# Patient Record
Sex: Male | Born: 1955
Health system: Southern US, Community
[De-identification: ages and names within clinical notes are randomized; demographics above are authoritative.]

## PROBLEM LIST (undated history)

## (undated) DIAGNOSIS — Z9289 Personal history of other medical treatment: Secondary | ICD-10-CM

## (undated) DIAGNOSIS — C439 Malignant melanoma of skin, unspecified: Secondary | ICD-10-CM

## (undated) DIAGNOSIS — E785 Hyperlipidemia, unspecified: Secondary | ICD-10-CM

## (undated) DIAGNOSIS — M1990 Unspecified osteoarthritis, unspecified site: Secondary | ICD-10-CM

## (undated) DIAGNOSIS — I219 Acute myocardial infarction, unspecified: Secondary | ICD-10-CM

## (undated) DIAGNOSIS — I2510 Atherosclerotic heart disease of native coronary artery without angina pectoris: Secondary | ICD-10-CM

## (undated) HISTORY — DX: Hyperlipidemia, unspecified: E78.5

## (undated) HISTORY — DX: Atherosclerotic heart disease of native coronary artery without angina pectoris: I25.10

## (undated) HISTORY — DX: Personal history of other medical treatment: Z92.89

## (undated) HISTORY — PX: COLONOSCOPY: SHX174

## (undated) HISTORY — DX: Acute myocardial infarction, unspecified: I21.9

## (undated) HISTORY — DX: Malignant melanoma of skin, unspecified: C43.9

## (undated) HISTORY — PX: CORONARY ANGIOPLASTY WITH STENT PLACEMENT: SHX49

---

## 2000-02-17 ENCOUNTER — Ambulatory Visit (HOSPITAL_BASED_OUTPATIENT_CLINIC_OR_DEPARTMENT_OTHER): Admission: RE | Admit: 2000-02-17 | Discharge: 2000-02-17 | Payer: Self-pay | Admitting: Otolaryngology

## 2002-07-02 ENCOUNTER — Encounter: Payer: Self-pay | Admitting: Emergency Medicine

## 2002-07-02 ENCOUNTER — Inpatient Hospital Stay (HOSPITAL_COMMUNITY): Admission: EM | Admit: 2002-07-02 | Discharge: 2002-07-07 | Payer: Self-pay | Admitting: Emergency Medicine

## 2005-06-23 ENCOUNTER — Ambulatory Visit: Payer: Self-pay | Admitting: Gastroenterology

## 2005-07-22 ENCOUNTER — Encounter: Payer: Self-pay | Admitting: Gastroenterology

## 2005-07-22 ENCOUNTER — Ambulatory Visit: Payer: Self-pay | Admitting: Gastroenterology

## 2005-07-22 LAB — HM COLONOSCOPY: HM Colonoscopy: NORMAL

## 2007-06-07 ENCOUNTER — Inpatient Hospital Stay (HOSPITAL_COMMUNITY): Admission: EM | Admit: 2007-06-07 | Discharge: 2007-06-09 | Payer: Self-pay | Admitting: Emergency Medicine

## 2007-06-07 HISTORY — PX: CARDIAC CATHETERIZATION: SHX172

## 2007-11-23 ENCOUNTER — Ambulatory Visit: Payer: Self-pay | Admitting: Family Medicine

## 2008-02-11 ENCOUNTER — Ambulatory Visit: Payer: Self-pay | Admitting: Family Medicine

## 2008-05-25 ENCOUNTER — Ambulatory Visit: Payer: Self-pay | Admitting: Family Medicine

## 2008-07-11 ENCOUNTER — Ambulatory Visit: Payer: Self-pay | Admitting: Family Medicine

## 2008-08-10 ENCOUNTER — Telehealth: Payer: Self-pay | Admitting: Gastroenterology

## 2008-08-11 ENCOUNTER — Ambulatory Visit: Payer: Self-pay | Admitting: Family Medicine

## 2008-10-24 ENCOUNTER — Ambulatory Visit: Payer: Self-pay | Admitting: Family Medicine

## 2009-03-14 ENCOUNTER — Ambulatory Visit: Payer: Self-pay | Admitting: Family Medicine

## 2009-03-16 ENCOUNTER — Ambulatory Visit: Payer: Self-pay | Admitting: Family Medicine

## 2010-05-09 ENCOUNTER — Ambulatory Visit (INDEPENDENT_AMBULATORY_CARE_PROVIDER_SITE_OTHER): Payer: 59 | Admitting: Family Medicine

## 2010-05-09 DIAGNOSIS — M722 Plantar fascial fibromatosis: Secondary | ICD-10-CM

## 2010-05-09 DIAGNOSIS — M719 Bursopathy, unspecified: Secondary | ICD-10-CM

## 2010-05-09 DIAGNOSIS — M67919 Unspecified disorder of synovium and tendon, unspecified shoulder: Secondary | ICD-10-CM

## 2010-05-21 NOTE — H&P (Signed)
NAMEAUSTAN, Brandon Frederick                ACCOUNT NO.:  [id number]   MEDICAL RECORD NO.:  [id number]          PATIENT TYPE:  INP   LOCATION:  6526                         FACILITY:  [facility]   PHYSICIAN:  Brandon Frederick, M.D.     DATE OF BIRTH:  1955/09/29   DATE OF ADMISSION:  06/07/2007  DATE OF DISCHARGE:  06/09/2007                              HISTORY & PHYSICAL   CHIEF COMPLAINT:  Pain between his shoulders.   HISTORY OF PRESENT ILLNESS:  The patient is a 55 year old male with a  history of coronary artery disease.  He had an anterior MI treated with  LAD Cypher stenting in June 2004.  He had moderate residual disease in  the OM, circumflex, and PDA.  He has had a recent Myoview as an  outpatient which was low risk with an EF of 42%.  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 He was doing some non-strenuous work in [employer] when he developed some subscapular pain just like my heart  attack.  Symptoms eventually were relieved with nitroglycerin.  He had  some mild diaphoresis and shortness of breath with his pain.  He has had  no recent history of chest pain or dyspnea on exertion.   PAST MEDICAL HISTORY:  Remarkable for dyslipidemia and chronic  hypotension.   CURRENT MEDICATIONS:  1. Altace 2.5 mg b.i.d.  2. Vytorin 10/40 daily.  3. Aspirin 81 mg a day.  4. Folic acid.  5. Plavix.   ALLERGIES:  He has no known drug allergies, but he has been intolerant  to higher doses of ACE inhibitors and intolerant to beta-blockers in the  past because of bradycardia.   SOCIAL HISTORY:  He is married.  He is retired.  He is a nonsmoker.   FAMILY HISTORY:  Remarkable that his father had an MI in his 25s.   REVIEW OF SYSTEMS:  Essentially unremarkable, except for noted above.  He denies any GI bleeding, melena, fever, or chills.  He has not had  diabetes or thyroid problems.   PHYSICAL EXAMINATION:  VITAL SIGNS:  Blood pressure 98/58, pulse 76, and  respirations 16.  GENERAL:  He  is well-developed, well-nourished male in no acute  distress.  HEENT:  Normocephalic.  Extraocular movements are intact.  Sclerae is  anicteric.  Lids and conjunctivae are within normal limits.  NECK:  Without JVD or bruit.  Thyroid is not enlarged.  CHEST:  Clear to auscultation and percussion.  CARDIAC:  Reveals regular rate and rhythm without murmur, rub, or  gallop.  Normal S1 and S2.  ABDOMEN:  Nontender. No hepatosplenomegaly.  Bowel sounds are present.  EXTREMITIES:  Without edema.  Distal pulses are 3+/4 bilaterally.  NEURO:  Grossly intact.  He is awake, alert, oriented, and cooperative.  Moves all extremities without obvious deficit.  SKIN:  Warm and dry.   EKG in the emergency room shows sinus rhythm with septal Q-waves.  His  initial troponin is negative.   IMPRESSION:  1. Unstable angina.  2. Coronary artery disease with left anterior descending Cypher  stenting in June 2004.  3. Moderate left ventricular dysfunction with an ejection fraction of      42% by recent Myoview.  4. Dyslipidemia, currently on treatment.  5. Family history of coronary artery disease.   PLAN:  The patient was seen by Dr. Clarene Duke and myself in the emergency  room.  We will start him on IV heparin and nitroglycerin.  He will most  likely need diagnostic catheterization and we will set this up for  tomorrow.      Abelino Derrick, P.A.    ______________________________  Brandon Frederick, M.D.    Lenard Lance  D:  06/09/2007  T:  06/09/2007  Job:  [id number]

## 2010-05-21 NOTE — Discharge Summary (Signed)
NAMEVANNIE, Brandon Frederick                ACCOUNT NO.:  [id number]   MEDICAL RECORD NO.:  [id number]          PATIENT TYPE:  INP   LOCATION:  6526                         FACILITY:  [facility]   PHYSICIAN:  Nicki Guadalajara, M.D.     DATE OF BIRTH:  1955-09-22   DATE OF ADMISSION:  06/07/2007  DATE OF DISCHARGE:  06/09/2007                               DISCHARGE SUMMARY   DISCHARGE DIAGNOSES:  1. Unstable angina, posterolateral artery and left anterior descending      Cypher stenting this admission.  2. Known coronary disease with previous left anterior descending      Cypher stenting in June 2004, the site was patent this admission.  3. Treated dyslipidemia.  4. Moderate left ventricular dysfunction with an ejection fraction of      45%.  5. History of chronic hypotension.  6. History of beta-blocker intolerance because of bradycardia.   HOSPITAL COURSE:  Brandon Frederick is a 55 year old male followed by Dr. Tresa Endo.  He had an anterior MI treated with LAD Cypher stent in June 2004.  He  has done well since.  He had moderate disease in the circumflex and PDA  and OM in 2004.  He recently had a negative Myoview as an outpatient.  He presented to the emergency room on June 07, 2007, with subscapular  pain which he says was very similar to his pre MI symptoms.  He was  admitted to telemetry, started on IV heparin and nitrates.  Enzymes were  negative.  Diagnostic catheterization was done on June 08, 2007, which  revealed 90% PLA lesion and a 90% LAD lesion after the previously placed  Cypher stent.  He will underwent interventions to both sites with Cypher  stents.  He tolerated the procedure well.  His EF was 45% at cath.  His  enzyme troponins were negative.  We feel he can be discharged on June 09, 2007.  He will follow up Dr. Tresa Endo as an outpatient.   DISCHARGE MEDICATIONS:  1. Altace 2.5 mg b.i.d.  2. Vytorin 10/40 daily.  3. Plavix 75 mg a day.  4. Aspirin 325 mg a day.  5. Folic acid 1 mg a  day.  6. Imdur 30 mg a day.  7. Nitroglycerin sublingual p.r.n.   LABS:  CK-MB and troponins were negative.  Sodium 139, potassium 3.8,  BUN 10, and creatinine 1.27.  White count 7.8, hemoglobin 13.6,  hematocrit 39.7, and platelets 147,000.  TSH 3.45.  Chest x-ray:  No  active disease.  INR 1.0.  EKG:  Sinus rhythm, low voltage, septal Q-  waves.   DISPOSITION:  The patient was discharged in stable condition.  He will  follow up with Dr. Tresa Endo as an outpatient.  As noted above in the past,  the patient has been intolerant to the higher doses of ACE inhibitors  because of hypotension and intolerant to beta-blockers because of  bradycardia.      Brandon Frederick, P.A.    ______________________________  Nicki Guadalajara, M.D.    Lenard Lance  D:  06/09/2007  T:  06/09/2007  Job:  [id number] 

## 2010-05-21 NOTE — Cardiovascular Report (Signed)
Brandon Frederick, Brandon Frederick                ACCOUNT NO.:  [id number]   MEDICAL RECORD NO.:  [id number]           PATIENT TYPE:   LOCATION:                                 FACILITY:   PHYSICIAN:  Nicki Guadalajara, M.D.     DATE OF BIRTH:  1955-12-23   DATE OF PROCEDURE:  06/08/2007  DATE OF DISCHARGE:                            CARDIAC CATHETERIZATION   INDICATIONS:  Mr. Heywood Tokunaga is a 55 year old gentleman who suffered  an anterior wall myocardial infarction on July 02, 2002, and underwent  successful stenting of a totally occluded proximal LAD with ultimate  insertion of a 3.5 x 28 mm Cypher stent.  He did have concomitant CAD  with 50% and 70% narrowings in the circumflex vessel, 10% narrowing in  the proximal right coronary artery, and 40% PLA stenosis.  Subsequently,  he has done remarkably well.  He was admitted to [facility]  yesterday with back discomfort, which he felt was similar to his prior  discomfort.  He is now referred for definitive repeat cardiac  catheterization.   PROCEDURE:  After premedication with Versed 2 mg intravenously, the  patient was prepped and draped in the usual fashion.  His right femoral  artery was punctured anteriorly and a 5-French sheath was inserted.  Diagnostic catheterization was done utilizing 5-French Judkins 4 left  and right coronary catheters.  IC nitroglycerin was administered down  the LAD as well as down the right coronary artery for further evaluation  of the new areas of stenoses.  A 5-French pigtail catheter was used for  biplane cine left ventriculography as well as distal aortography.  With  the demonstration of progressive narrowing of 90% beyond the stented  segment in the mid LAD as well as 90% mid PLA stenosis, the decision was  made to perform intervention.  Angiograms were reviewed with the patient  who concurred.  The 5-French sheath was upgraded to a 6-French system.  The patient had been on aspirin and Plavix and  consequently was given  Angiomax for anticoagulation.  He received an additional 300 mg of  Plavix load in the lab since he had not taken his Plavix today.  Attention was first directed at the LAD.  ACT was documented to be  therapeutic.  The patient received also 25 mg of fentanyl, which  improved his nitrate-induced headache.  Ultimately, XB 4.0 guide 6-  [place] was used for the intervention.  An ATW marker wire was advanced  down the LAD and was help with stent sizing.  I had requested a 3.0 x 18  mm Cypher stent, but unfortunately there were none available in the  laboratory and consequently, I inserted a 2.75 x 18 mm Cypher stent in  the mid LAD with the previously placed more proximal stent at the most  distal end of this previously placed stent and the 18-mm stent covered  the 90% stenosis and narrowing of 30% just beyond this.  There was no  attempt to cover the more distally segment of 90% in the mid distal LAD.  The stent was dilated up  to 16 and 17 atmospheres.  Post-stent  dilatation was done utilizing a 3.25 x 15 mm balloon with stent taper  from 3.35 mm proximally to 3.25 mm distally.  Scout angiography  confirmed an excellent angiographic result with brisk TIMI III flow and  the entire stenosis being reduced to 0% from 90%.   Attention was then directed at the right coronary artery.  The patient  received an additional 25 mg of fentanyl.  An FR-4 right catheter was  used, 6-French.  The same ATW wire was advanced down the RCA into the  PLA vessel.  Primary stenting was done utilizing a 2.75 x 13 mm Cypher  stent.  Post-dilatation was done utilizing a 3.0 x 12 mm Cypher stent,  but there still was mild dumbbell shape in the mid segment at the site  of the focal stenosis and a 3.25 x 8 mm Quantum balloon was used to  maximally dilate this segment.  The 90% stenosis was reduced to 0%.  There was brisk TIMI III flow.  There was no evidence for dissection.  The patient  tolerated the procedure well.   HEMODYNAMIC DATA:  Central aortic pressure was 116/78 and left ventricle  pressure was 116/10.   ANGIOGRAPHIC DATA:  Left main coronary artery was angiographically  normal and bifurcated into LAD and left circumflex system.  There was  minimal luminal irregularity of 10% in the ostially and proximal to the  first diagonal vessel.  The previously placed Cypher stent 3.5 x 28 mm  was in the LAD and placed between the first and second diagonal vessel.  This was widely patent without restenosis.  Beyond the second diagonal  vessel, there was 90% stenosis in the LAD followed by an area of 30%  narrowing immediately thereafter and then there was an area of normal-  appearing vessel and then beyond this segment in the mid distal LAD,  there was another 30% area of narrowing.   The circumflex vessel had 50% OM-1 narrowing and 30-40% proximal AV  groove narrowing, which actually was improved from 2004.   The right coronary was a large-caliber dominant vessel, which had 20%  irregularity proximally and 20-30% irregularity in the region of the  crux prior to the PDA takeoff.  The RCA supplied a large PDA and large  posterolateral vessel.  There was a focal 90% mid stenosis in this large  PLA vessel.   Biplane cine left ventriculography revealed mild global LV dysfunction  with an ejection fraction of approximately 45%.  There was severe  hypokinesis involving the anterolateral wall from the mid segment to the  apex on the RAO projection, and the LAO projection, there was  hypocontractility in the mid to basal septal region.   Following coronary intervention to the LAD, the 90% stenosis beyond the  stent from 2004 was ultimately reduced to 0% with insertion of a 2.75 x  18 mm Cypher stent in tandem with the previously placed more proximal  stent and with post-dilatation up to 3.35 mm tapering to 3.25 mm at the  distal aspect of this stent.  There was no change  in the more distal 30%  narrowing in the LAD, which was not intervened upon.   Following intervention to the right coronary artery, the 90% PLA  stenosis was reduced to 0% with insertion of a 2.75 x 13 mm Cypher  stent.  Initially, postdilated with a 3.0 x 12 mm Quantum balloon and  ultimately postdilated with a 3.25  x 8 mm Quantum balloon with the 95%  stenosis being reduced to 0%, done with Angiomax, Plavix, and IC  nitroglycerin.           ______________________________  Nicki Guadalajara, M.D.     TK/MEDQ  D:  06/08/2007  T:  06/09/2007  Job:  [id number]

## 2010-05-24 NOTE — Cardiovascular Report (Signed)
Brandon Frederick, Brandon Frederick                           ACCOUNT NO.:  [id number]   MEDICAL RECORD NO.:  [id number]                   PATIENT TYPE:  INP   LOCATION:  2924                                 FACILITY:  [facility]   PHYSICIAN:  Nicki Guadalajara, M.D.                  DATE OF BIRTH:  11-29-1955   DATE OF PROCEDURE:  DATE OF DISCHARGE:                              CARDIAC CATHETERIZATION   PROCEDURE:  Emergency left-heart catheterization:  Cine coronary  angiography; biplane cine electrocardiography, distal aortography, temporary  transvenous pacemaker, AngioJet Rheolytic thrombectomy, PTCA and stenting of  the left anterior descending artery, ReoPro glycoprotein IIb/IIIa  inhibition.   INDICATIONS:  The patient is a 55 year old white, previously-healthy male  without prior cardiac history, on no medications.  He developed acute onset  of substernal chest pressure that was severe while cutting his grass at  approximately 4 p.m.  His chest pain persisted and became more intense.  He  was brought to the emergency room via EMS and ECG transmission revealed  acute anterior wall injury current with 5 mm of ST-segment elevation.   The patient arrived in the emergency room and was treated with IV  nitroglycerin, heparin, aspirin, morphine, as well as IV beta-blocker 5 mg  x3, and he was taken emergently to the cardiac catheterization laboratory.   PROCEDURE:  After premedication with an additional 2 mg of Versed, upon  arrival to the catheterization laboratory the patient was prepped and draped  in the usual fashion.  His right femoral artery was punctured anteriorly,  and a 7-French sheath was inserted.  Diagnostic catheterization was done  with 6-French Judkins' left and right coronary catheters.  A pigtail  catheter was used for biplane cine electrocardiography as well as distal  aortography.  With the demonstration of total occlusion with thrombus in the  LAD, it was felt that the patient  would benefit from emergency intervention  as well as AngioJet from Rheolytic thrombectomy.  ReoPro bolus plus infusion  was given for glycoprotein IIb/IIIa inhibition in its acute coronary ST  segment elevation MI setting.  Plavix 300 mg was administered.  The right  femoral vein was punctured, and a venous sheath was inserted.  A 5-French  transvenous pacemaker was advanced under fluoroscopic guidance to the RV  apex.  Thresholds were obtained which revealed excellent capture and  sensitivity.  A pacer wire was then advanced down the LAD across the total  occlusion to the apex.  This resulted in some restoration of flow, but there  was an extensive thrombus burden.  AngioJet was then performed with two  prolonged passes throughout the mid distal to proximal LAD.  This resulted  in significant removal of thrombus burden, and it was now apparent that  there was a significant ulcerated plaque at the acute infarct site.  Dilatation was done with a 3.25 x15 mm Maverick balloon.  It was  felt that  there was a narrowing above and below this ulcerated plaque, and  consequently, a 3.5 x28 mm drug eluting CYPHER stent was positioned just  after the large proximal diagonal vessel and just prior to the mid diagonal  vessel.  This was successfully dilated and deployed up to 18 atmospheres,  corresponding 3.79 mm.  Post stent dilatation was done with a 4.0 x20 mm  Quantum balloon in a tapering fashion to the extent that the proximal  stented segment was dilated to 4.1 mm and the distal stented segment was  dilated to 4.0 mm.  There was TIMI 3 flow.  There was no evidence for  dissection.  During the procedure, the patient received several doses of  intracoronary nitroglycerin.  He also received several additional boluses of  intravenous heparin in addition to the previous 5000 units of heparin that  had been administered in the emergency room to maintain ACT in the high  therapeutic range, especially  in light of his significant thrombus burden  initially.  At the completion of the procedure, he was hemodynamically  stable, and essentially pain-free.  Arterial and venous sheaths were sutured  in place.  Upon leaving the catheterization laboratory, there was no  evidence for any hematoma at either site.   HEMODYNAMIC DATA:  Central aortic pressure was 124/82.  Left ventricular  pressure was 124/12.  Post A-wave was 23.   ANGIOGRAPHIC DATA:  Left main coronary artery was a large vessel that had  less than 10% narrowing in its mid segment.  The left main bifurcated into  an LAD and left circumflex system.  The proximal LAD had 20% narrowing.  The  first diagonal vessel was a small-caliber vessel.  There was some mild,  sluggish flow in this very small first diagonal vessel which also appeared  to have ostial narrowing of 90%.  The second diagonal vessel was a very  large caliber vessel which was still very proximal.  Just after this large  diagonal vessel, the LAD was totally occluded, and there was evidence for  occlusive thrombus.  There was a large void without collateralization in the  remaining anterior and apical wall.  The circumflex vessel had 50% proximal  narrowing.  The first marginal vessel was small caliber and had 70% ostial  narrowing.  The second marginal vessel was small caliber and free of  significant disease.   The right coronary artery was a very large, dominant vessel that had mild  10% narrowing proximally.  The vessel supplied a large PDA, two inferior LV  branches, and ended in a large posterolateral coronary artery with 40% mid  PLA narrowing.   Biplane cine electrocardiography revealed acute LV dysfunction with  significant hypo to akinesis involving the mid anterior anterolateral wall  extending to the apex and wrapping around to involve the distal inferior wall in the RAO projection, and involving the entire intraventricular septal  segment, extending to  the apex on the LAO projection.   DISTAL AORTOGRAPHY:  Revealed a tortuous nonobstructive infrarenal aorta.  There was no renal artery stenosis.   Following successful AngioJet Rheolytic thrombectomy, PTCA, and stenting  with a 3.5 x28 mm drug-eluting CYPHER stent with post-dilatation in a  tapered fashion to 4.1 mm proximally and 4.0 mm in the distal aspect of the  stent, the 100% occlusion was reduced to 0%.  TIMI 0 flow was improved to  TIMI 3 flow.  At the completion of the procedure, there was no evidence  for  any dissection, and the patient was pain free.   IMPRESSION:  1. Acute anterior wall myocardial infarction/ injury secondary to proximal     left anterior descending occlusion with initial TIMI 0 flow.  2. Acute mild to moderate LV dysfunction (EF of 45% with significant hypo to     akinesis involving the mid distal anterolateral wall apex and distal     inferior wall in the RAO projection involving the entire intraventricular     septum extending to the apex on the LAO projection.  3. Evidence for multivessel CAD with mild 10% narrowing in the mid left     main, 20% proximal LAD narrowing with 90-95% stenosis in a very small     first-diagonal vessel ostially, 40-50% narrowing in the ostium of a large     proximal diagonal vessel, followed by total occlusion of the proximal     LAD.  4. A 50% proximal circumflex stenosis with 70% stenosis in a small OM1     vessel.  5. A 10% irregularity in the proximal RCA with 40% narrowing in a large     posterolateral branch, and a dominant right coronary artery.  6. Successful PTCA/ stenting following AngioJet Rheolytic thrombectomy with     ultimate DES stenting with a 3.5 x28 mm CYPHER stent positioned between     the second and third diagonal vessels, dilated to 4.1 with 4.0 taper,     with residual narrowing of 0% and resumption of TIMI 3 flow.     (Reperfusion time 2 hours and 35 minutes from chest pain onset).  7. Temporary  pacemaker.  8. AngioJet.  9. ReoPro glycoprotein IIb/IIIa for inhibition.                                               Nicki Guadalajara, M.D.    TK/MEDQ  D:  07/02/2002  T:  07/03/2002  Job:  [id number]  Sheppard Penton. Stacie Acres, M.D.  [address]  [place] [address]  Fax: [phone]   Card[facility]athe[facility]   [facility]   cc:   Sheppard Penton. Stacie Acres, M.D.  [address] [place]  [place] [address]  Fax: [phone]   Cardiac Catheterization Laboratory   [facility]

## 2010-05-24 NOTE — Discharge Summary (Signed)
Brandon Frederick, Brandon Frederick                           ACCOUNT NO.:  [id number]   MEDICAL RECORD NO.:  [id number]                   PATIENT TYPE:  INP   LOCATION:  2034                                 FACILITY:  [facility]   PHYSICIAN:  Nicki Guadalajara, M.D.                  DATE OF BIRTH:  04-Jun-1955   DATE OF ADMISSION:  07/02/2002  DATE OF DISCHARGE:  07/07/2002                                 DISCHARGE SUMMARY   DISCHARGE DIAGNOSES:  1. Acute anterior myocardial infarction.  2. Coronary artery disease with rescue percutaneous transluminal coronary     angioplasty and stent to left anterior descending artery with drug-     eluting stent.  3. Wide complex tachycardia.  4. Dyslipidemia.  5. Bradycardia.  6. Borderline hypotension.  7. Residual coronary artery disease with circumflex and right coronary     artery.  8. Left ventricular dysfunction with ejection fraction of 45.   CONDITION ON DISCHARGE:  Improved.   PROCEDURES:  1. On July 02, 2002, emergent cardiac catheterization secondary to acute MI.  2. On July 02, 2002, PTCA and Cypher stent to the LAD for acute MI.   DISCHARGE MEDICATIONS:  1. Zocor 40 mg at bedtime.  2. Enteric-coated aspirin 81 mg daily.  3. Plavix 75 mg daily.  4. Altace 1.25 twice a day.  5. Metoprolol 25 twice a day.  6. Nitroglycerin p.r.n. chest pain.   DISCHARGE INSTRUCTIONS:  1. No work until seen by Dr. Tresa Endo.  2. No strenuous activity, no lifting over 10 pounds until seen by Dr. Tresa Endo.  3. Low-fat diet.  4. Wash catheterization site with soap and water.  Call for any bleeding,     swelling, or drainage.  5. See Dr. Tresa Endo July 20, 2002, at 2:30 p.m.   HISTORY OF PRESENT ILLNESS:  A 55 year old white married male without prior  cardiac history was admitted to [facility] Emergency Room via EMS with acute  anterior injury and myocardial infarction.  The patient had known cardiac  risk factors.   The patient was cutting his gras at about 4 p.m. on the day of  the visit and  developed onset of chest pain.  Pain persistent and transferred to ER by EMS  with acute changes on EKG with ST elevations in II, III, IV, and V5, with  reciprocal changes in II, III, and aVF.   Started on nitroglycerin, heparin, aspirin, and morphine, IV Lopressor 5 mg  IV 3 was given.  He was taken emergently to the catheterization lab.   PAST MEDICAL HISTORY:  Fractured nose in the past which is basically all of  his medical history.   ALLERGIES:  None.   SOCIAL HISTORY:  Married with no children.  Works with [employer] and  acquisitions for [employer].  Does not use tobacco.   Family History, Social History, Review of Systems, see H&P.   DISCHARGE PHYSICAL  EXAMINATION:  VITAL SIGNS:  Blood pressure 88 to 99/56 to  71, pulse 58, respirations 20, temperature 97.4, oxygen saturation on room  air 97%.  LUNGS: Clear.  HEART:  Regular rate and rhythm.  EXTREMITIES:  Right groin had ecchymosis but no hematoma and no bruit.   LABORATORY DATA:  Admission hemoglobin 16, hematocrit 45, WBC 1.5, MCV 86.7,  platelets 190, neutrophils 79, lymph 12, monos 8, eos 1, basos 1.  Hemoglobin stayed stable.  Pro time 12.1, INR 0.8, PTT 24.   Chemistries: Sodium 143, potassium 4.6, chloride 111, glucose 115, BUN 23,  creatinine 1.3, calcium 8.4, magnesium 2.1.  Prior to discharge, these were  all within normal limits.  Homocystine level was slightly elevated at 14.89.   Cholesterol HDL 40, triglycerides 120, total cholesterol 165, LDL 101.   Lipoprotein A 24.  TSH 1.732.   UA was clear.   Chest x-ray: Heart sounds normal, no active disease done on admission.   EKG:  Initially as stated, acute ST elevations in V2, V3, V4, V5, slightly  in V6, with reciprocal changes in leads II, II, and aVF, also ST elevation  in lead I.  Second EKG actually at times showed a junctional rhythm with a  wide complex and goes into a sinus rhythm.  Heart rate is slow with the wide  complex.  On June 27,  EKG, sinus bradycardia, rate 56 with evolutionary  changes of anteroseptal MI.  ST waves have now decreased, and reciprocal  changes in the inferior leads are improving.  On June 28, he had floats of T  waves in V3, V4, V5, V6 and lead I.  V2 is still slightly elevated as in V3.   HOSPITAL COURSE:  [name] was admitted by Dr. Tresa Endo as an acute MI  on July 02, 2002, after seeing him in the emergency room.  He took him  emergently to the catheterization lab on heparin, nitroglycerin, and had  given him Lopressor prior to taking him.  Was found to have 10% stenosis in  the RCA, 50 and 70% circumflex branches, and 100% in the LAD opened with a  Cypher stent and angioplasty with improved flow, EF about 45%.   AngioJet was used.  The patient was transferred to CCU.   By June 27, the patient was improving.  He had a temporary pacemaker  inserted during cardiac catheterization with bradycardia.  That was  discontinued on June 27, and medicines were adjusted.  The patient continued  to improve, was placed on a statin.  Blood pressure was borderline.  Medicines had to be reduced.   Cardiac rehabilitation saw the patient and began ambulation, and the The  patient tolerated this reasonably well.  He did have one episode of wide  complex tachycardia, asymptomatic.   The patient continued to improve, and by July 07, 2002, he was stable and  ready for discharge home.  He was seen by Dr. Alanda Amass on discharge.      Darcella Gasman. [place], N.P.                     Nicki Guadalajara, M.D.    LRI/MEDQ  D:  07/31/2002  T:  07/31/2002  Job:  [id number]   cc:   [facility]  [place], [facility]   Cardiac Rehabilitation

## 2010-05-24 NOTE — Op Note (Signed)
[place]  Patient:    Brandon Frederick, Brandon Frederick                        MRN: [id number] Proc. Date: 02/17/00 Attending:  Jeannett Senior. Pollyann Kennedy, M.D. CC:         Dr. Robby Sermon   Operative Report  PREOPERATIVE DIAGNOSIS:  Nasal septal deviation and inferior turbinate hypertrophy.  POSTOPERATIVE DIAGNOSIS:  Nasal septal deviation and inferior turbinate hypertrophy.  PROCEDURES: 1. Nasal septoplasty. 2. Submucous resection inferior turbinates bilateral.  SURGEON:  Jefry H. Pollyann Kennedy, M.D.  ANESTHESIA:  General endotracheal anesthesia.  COMPLICATIONS:  None.  ESTIMATED BLOOD LOSS:  30 cc.  FINDINGS:  Severe deviation of the mid portion of the septum towards the left side with significant obstruction on the left inferiorly and superior obstruction towards the right with obstruction of the right side and very large bony hypertrophic changes of the inferior turbinates.  REFERRING PHYSICIAN:  Dr. Robby Sermon.  The patient tolerated the procedure well and was awakened, extubated and transferred to recovery in stable condition.  INDICATIONS FOR PROCEDURE:  This is a 55 year old gentleman with a long history of chronic nasal obstruction.  He has a history of multiple nasal traumas in the past playing sports.  Risks, benefits, alternatives and complications of the procedure were explained to the patient, who seemed to understand and agreed to surgery.  PROCEDURE:  The patient was taken to the operating room, placed on the operating table in supine position.  Following induction of general endotracheal anesthesia, the patient was prepped and draped in a standard fashion.  Xylocaine 1% with epinephrine was infiltrated into the septum, columella, and the inferior turbinates.  A total of 6 cc was used. Afrin-soaked pledgets were then placed bilaterally in the nose.  #1 - Nasal septoplasty.  A left hemitransfixion incision was created with a 15 scalpel to approach the  septal cartilage and mucoperichondrial flap was developed posteriorly down the left side of the nasal septum.  The bony cartilaginous junction was divided and a similar flap was developed posteriorly down the right side.  Jansen-Middleton rongeur was used to take down the superior attachment of the ethmoid perpendicular plate and then the posterior and inferior attachments were fractured with Therapist, nutritional and large fragments of deviated ethmoid plate were resected.  A 4 mm osteotome was used to remove a large spur down the left side posteriorly composed of the ______ junction.  The caudal septal cartilage was thickened and was thinned using a 15 scalpel.  The maxillary crest was tilted towards the left creating a second spur, which was also taken down with an osteotome.  These maneuvers nicely medialized the septum and created significant improvement of the airways.  Mucosal incision was reapproximated with interrupted 4-0 chromic suture.  A septal flap of 4-0 plain gut was then used to reappose the septal flaps.  #2 - Submucous resection inferior turbinates.  A 15 scalpel was used to incise the mucosa of the leading edge of the inferior turbinates.  A Freer elevator was used to elevate mucosa off the turbinate bones.  Large fragments of thickened and elongated turbinate bone were resected.  Turbinate remnants were out-fractured with a Therapist, nutritional.  The nasal cavities were suctioned of blood and secretions, packed with rolled up Telfa gauze coated with bacitracin ointment.  The pharynx was suctioned of blood and secretions under direct visualization.  The patient was then awakened, extubated and transferred to recovery. DD:  02/17/00 TD:  02/17/00 Job: [id number]

## 2010-10-03 LAB — POCT CARDIAC MARKERS
Operator id: 151321
Troponin i, poc: <0.05

## 2010-10-03 LAB — BASIC METABOLIC PANEL
BUN: 10
Calcium: 8.9
Creatinine, Ser: 1.27
GFR calc Af Amer: >60
GFR calc Af Amer: >60
Glucose, Bld: 117 — ABNORMAL HIGH
Sodium: 139

## 2010-10-03 LAB — CARDIAC PANEL(CRET KIN+CKTOT+MB+TROPI): Troponin I: 0.02

## 2010-10-03 LAB — DIFFERENTIAL
Basophils Relative: 1
Eosinophils Absolute: 0.1
Lymphocytes Relative: 12
Lymphs Abs: 1.1
Monocytes Relative: 10
Neutro Abs: 7.2

## 2010-10-03 LAB — CK TOTAL AND CKMB (NOT AT ARMC)
CK, MB: 2.3
Relative Index: 2.4
Relative Index: INVALID
Total CK: 81

## 2010-10-03 LAB — TROPONIN I
Troponin I: 0.01
Troponin I: <0.01

## 2010-10-03 LAB — CBC
HCT: 41
HCT: 42.4
Hemoglobin: 14
Hemoglobin: 14.7
MCHC: 34.1
MCHC: 34.4
MCHC: 34.6
MCV: 92.3
MCV: 92.6
MCV: 93.3
Platelets: 147 — ABNORMAL LOW
RBC: 4.3
RBC: 4.39
RDW: 14.5
RDW: 14.5
WBC: 7.8

## 2010-10-03 LAB — PROTIME-INR
INR: 1
Prothrombin Time: 13.4

## 2010-10-03 LAB — APTT: aPTT: 134 — ABNORMAL HIGH

## 2011-01-02 ENCOUNTER — Encounter: Payer: Self-pay | Admitting: Internal Medicine

## 2011-01-03 ENCOUNTER — Encounter: Payer: Self-pay | Admitting: Medical

## 2011-01-03 ENCOUNTER — Ambulatory Visit (INDEPENDENT_AMBULATORY_CARE_PROVIDER_SITE_OTHER): Payer: 59 | Admitting: Medical

## 2011-01-03 VITALS — BP 120/80 | HR 78 | Temp 97.7°F | Resp 16 | Wt 242.0 lb

## 2011-01-03 DIAGNOSIS — M722 Plantar fascial fibromatosis: Secondary | ICD-10-CM

## 2011-01-03 DIAGNOSIS — M7062 Trochanteric bursitis, left hip: Secondary | ICD-10-CM | POA: Insufficient documentation

## 2011-01-03 DIAGNOSIS — M76899 Other specified enthesopathies of unspecified lower limb, excluding foot: Secondary | ICD-10-CM

## 2011-01-03 MED ORDER — HYDROCODONE-ACETAMINOPHEN 5-500 MG PO TABS: 1.0000 | ORAL_TABLET | Freq: Four times a day (QID) | ORAL | Status: AC | PRN

## 2011-01-03 MED ORDER — DICLOFENAC SODIUM 75 MG PO TBEC: 75.0000 mg | DELAYED_RELEASE_TABLET | Freq: Two times a day (BID) | ORAL | Status: DC

## 2011-01-03 NOTE — Patient Instructions (Signed)
Rest, stay off the leg for a few days if possible, use can use ice topically.  Diclofenac twice a day for the next few days for least amount of time needed.  You can use Lortab pain pill every 4-6 hours as needed for breakthrough pain.    If not improving, return for injection.   Trochanteric Bursitis You have hip pain due to trochanteric bursitis. Bursitis means that the sack near the outside of the hip is filled with fluid and inflamed. This sack is made up of protective soft tissue. The pain from trochanteric bursitis can be severe and keep you from sleep. It can radiate to the buttocks or down the outside of the thigh to the knee. The pain is almost always worse when rising from the seated or lying position and with walking. Pain can improve after you take a few steps. It happens more often in people with hip joint and lumbar spine problems, such as arthritis or previous surgery. Very rarely the trochanteric bursa can become infected, and antibiotics and/or surgery may be needed. Treatment often includes an injection of local anesthetic mixed with cortisone medicine. This medicine is injected into the area where it is most tender over the hip. Repeat injections may be necessary if the response to treatment is slow. You can apply ice packs over the tender area for 30 minutes every 2 hours for the next few days. Anti-inflammatory and/or narcotic pain medicine may also be helpful. Limit your activity for the next few days if the pain continues. See your caregiver in 5-10 days if you are not greatly improved.  SEEK IMMEDIATE MEDICAL CARE IF:  You develop severe pain, fever, or increased redness.  You have pain that radiates below the knee.

## 2011-01-03 NOTE — Progress Notes (Signed)
Subjective:   HPI  Brandon Frederick is a 55 y.o. male who presents with c/o left hip pain.  He notes intermittent aches and pains in general, but having left hip pain that started a few days before Christmas. The pain has been persistent.  Can walk, but can't do a lot of weight bearing, can't sit certain ways or go up stairs.  Hasn't done any different activity than normal. 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  Denies recent trauma or injury.   No fever, no prior similar.  Has lingering plantar fascitis in left foot.  Been using stretching techniques.  No improvement in several months.  No other aggravating or relieving factors.    No other c/o.  The following portions of the patient's history were reviewed and updated as appropriate: allergies, current medications, past family history, past medical history, past social history, past surgical history and problem list.  Past Medical History  Diagnosis Date  . ASHD (arteriosclerotic heart disease)     stent  . Melanoma   . Dyslipidemia    Review of Systems Constitutional: -fever, -chills, -sweats, -unexpected -weight change,-fatigue ENT: -runny nose, -ear pain, -sore throat Cardiology:  -chest pain, -palpitations, -edema Respiratory: -cough, -shortness of breath, -wheezing Gastroenterology: -abdominal pain, -nausea, -vomiting, -diarrhea, -constipation Hematology: -bleeding or bruising problems Musculoskeletal: -arthralgias, -myalgias, -joint swelling, +back pain Ophthalmology: -vision changes Urology: -dysuria, -difficulty urinating, -hematuria, -urinary frequency, -urgency Neurology: +headache, -weakness, -tingling, -numbness      Objective:   Physical Exam  Filed Vitals:   01/03/11 0815  BP: 120/80  Pulse: 78  Temp: 97.7 F (36.5 C)  Resp: 16    General appearance: alert, no distress, WD/WN, white male Pulses: 2+  symmetric, upper and lower extremities, normal cap refill MSK:  Quite tender over left trochanteric bursa, pain in the same area with left hip internal and external rotation, tender along left plantar fascia, otherwise bilateral lower extremity exam unremarkable, he is limping some, guarded on the left leg Neuro: Normal strength and sensation, DTRs normal   Assessment and Plan :    Encounter Diagnoses  Name Primary?  . Trochanteric bursitis of left hip Yes  . Plantar fasciitis    Discussed options for treatment for bursitis, and for now he will use a course of diclofenac and Lortab for breakthrough pain, rest, can use topical ice pack, modified activity for now. If worse or not improving consider localized injection.  Plantar fasciitis-discussed ice water bottle massage, tennis ball massage, stretching techniques. Offered referral. He will consider and let me know.   Follow-up one week.

## 2011-07-28 ENCOUNTER — Ambulatory Visit (INDEPENDENT_AMBULATORY_CARE_PROVIDER_SITE_OTHER): Payer: 59 | Admitting: Family Medicine

## 2011-07-28 ENCOUNTER — Encounter: Payer: Self-pay | Admitting: Family Medicine

## 2011-07-28 VITALS — BP 130/80 | HR 68 | Ht 74.5 in | Wt 236.0 lb

## 2011-07-28 DIAGNOSIS — I2510 Atherosclerotic heart disease of native coronary artery without angina pectoris: Secondary | ICD-10-CM | POA: Insufficient documentation

## 2011-07-28 DIAGNOSIS — Z85820 Personal history of malignant melanoma of skin: Secondary | ICD-10-CM

## 2011-07-28 DIAGNOSIS — Z0000 Encounter for general adult medical examination without abnormal findings: Secondary | ICD-10-CM

## 2011-07-28 LAB — HEMOCCULT GUIAC POC 1CARD (OFFICE)

## 2011-07-28 NOTE — Progress Notes (Signed)
  Subjective:    Patient ID: Brandon Frederick, male    DOB: 11-09-1955, 55 y.o.   MRN: [id number]  HPI He is here for a complete examination. He does see his cardiologist him yearly basis as well as his dermatologist. He has had no chest pain, shortness of breath, DOE. He has seen no skin lesions. He has no other concerns or complaints.  Review of Systems  Constitutional: Negative.   HENT: Negative.   Eyes: Negative.   Respiratory: Negative.   Cardiovascular: Negative.   Gastrointestinal: Negative.   Genitourinary: Negative.   Musculoskeletal: Negative.   Neurological: Negative.   Hematological: Negative.   Psychiatric/Behavioral: Negative.        Objective:   Physical Exam BP 130/80  Pulse 68  Ht 6' 2.5" (1.892 m)  Wt 236 lb (107.049 kg)  BMI 29.90 kg/m2  SpO2 98%  General Appearance:    Alert, cooperative, no distress, appears stated age  Head:    Normocephalic, without obvious abnormality, atraumatic  Eyes:    PERRL, conjunctiva/corneas clear, EOM's intact, fundi    benign  Ears:    Normal TM's and external ear canals  Nose:   Nares normal, mucosa normal, no drainage or sinus   tenderness  Throat:   Lips, mucosa, and tongue normal; teeth and gums normal  Neck:   Supple, no lymphadenopathy;  thyroid:  no   enlargement/tenderness/nodules; no carotid   bruit or JVD  Back:    Spine nontender, no curvature, ROM normal, no CVA     tenderness  Lungs:     Clear to auscultation bilaterally without wheezes, rales or     ronchi; respirations unlabored  Chest Wall:    No tenderness or deformity   Heart:    Regular rate and rhythm, S1 and S2 normal, no murmur, rub   or gallop  Breast Exam:    No chest wall tenderness, masses or gynecomastia  Abdomen:     Soft, non-tender, nondistended, normoactive bowel sounds,    no masses, no hepatosplenomegaly  Genitalia:    Normal male external genitalia without lesions.  Testicles without masses.  No inguinal hernias.  Rectal:    Normal  sphincter tone, no masses or tenderness; guaiac negative stool.  Prostate smooth, no nodules, not enlarged.  Extremities:   No clubbing, cyanosis or edema  Pulses:   2+ and symmetric all extremities  Skin:   Skin color, texture, turgor normal, no rashes or lesions  Lymph nodes:   Cervical, supraclavicular, and axillary nodes normal  Neurologic:   CNII-XII intact, normal strength, sensation and gait; reflexes 2+ and symmetric throughout          Psych:   Normal mood, affect, hygiene and grooming.          Assessment & Plan:   1. Routine general medical examination at a health care facility  Hemoccult - 1 Card (office)  2. History of melanoma    3. ASHD (arteriosclerotic heart disease)     I encouraged him to continue to take good care of himself. We discussed PSA testing and he has declined. He will have blood drawn through his work at [employer]

## 2011-08-06 ENCOUNTER — Encounter: Payer: Self-pay | Admitting: Family Medicine

## 2011-09-07 DIAGNOSIS — Z9289 Personal history of other medical treatment: Secondary | ICD-10-CM

## 2011-09-07 HISTORY — DX: Personal history of other medical treatment: Z92.89

## 2011-09-26 ENCOUNTER — Encounter: Payer: Self-pay | Admitting: Family Medicine

## 2011-10-09 ENCOUNTER — Telehealth: Payer: Self-pay | Admitting: Internal Medicine

## 2011-10-09 ENCOUNTER — Telehealth: Payer: Self-pay | Admitting: Family Medicine

## 2011-10-09 MED ORDER — TADALAFIL 20 MG PO TABS: 20.0000 mg | ORAL_TABLET | ORAL | Status: DC | PRN

## 2011-10-09 NOTE — Telephone Encounter (Signed)
His cardiologist apparently okayed him trying an ED medication. I will give him samples of Viagra 5 mg

## 2011-10-09 NOTE — Telephone Encounter (Signed)
LM

## 2011-10-09 NOTE — Telephone Encounter (Signed)
pt states he would like to talk to you quickly about what he talked to his cardiologist about. Please give him a call when ur available.

## 2012-04-27 ENCOUNTER — Encounter: Payer: Self-pay | Admitting: Family Medicine

## 2012-04-27 ENCOUNTER — Ambulatory Visit (INDEPENDENT_AMBULATORY_CARE_PROVIDER_SITE_OTHER): Payer: 59 | Admitting: Family Medicine

## 2012-04-27 ENCOUNTER — Ambulatory Visit
Admission: RE | Admit: 2012-04-27 | Discharge: 2012-04-27 | Disposition: A | Discharge status: Home / Self Care | Payer: 59 | Source: Ambulatory Visit | Attending: Family Medicine | Admitting: Family Medicine

## 2012-04-27 VITALS — BP 120/80 | HR 78 | Wt 241.0 lb

## 2012-04-27 DIAGNOSIS — M79671 Pain in right foot: Secondary | ICD-10-CM

## 2012-04-27 DIAGNOSIS — M79609 Pain in unspecified limb: Secondary | ICD-10-CM

## 2012-04-27 NOTE — Progress Notes (Signed)
  Subjective:    Patient ID: Brandon Frederick, male    DOB: 1955/11/09, 57 y.o.   MRN: [id number]  HPI He is here for 2 issues. He does have a history of plantar fasciitis but states that he is roughly 95% better . He has been stretching and uses an arch support. He also has an eight-month history of right second toe pain at the MTP joint ACE inhibitor therapy was not prescribed due to  New physical activities. No new shoes. He has been using an arch support.  Review of Systems     Objective:   Physical Exam Right foot exam shows normal pulses and sensation. Tender to palpation over the second metatarsal head. Compression test was negative.       Assessment & Plan:  Foot pain, right - Plan: DG Foot Complete Right if the x-ray is negative, I will refer to podiatry for evaluation for orthotic

## 2012-04-27 NOTE — Progress Notes (Signed)
Quick Note:  PT INFORMED WORD FOR WORD AND HE SAID HE WOULD GO TO [facility] ______

## 2012-05-26 ENCOUNTER — Telehealth: Payer: Self-pay | Admitting: Cardiovascular Disease

## 2012-05-26 NOTE — Telephone Encounter (Signed)
Brandon Frederick is calling because he has questions about his medications . Please call on his cell .Marland Kitchen..[phone]   Thanks

## 2012-05-26 NOTE — Telephone Encounter (Signed)
Talked to Brandon Frederick today he stated he was wanted to get our opinion on three med/supplement the [facility]. Loss center over in [facility] 1.HCG B12 complex 2.B6 3. Manganese Citrate. Just wanted to know if there was going to be any problem with his current cardiac meds

## 2012-05-27 NOTE — Telephone Encounter (Signed)
LMOM for patient that supplements are ok with current cardiac meds.  Did explain that the B vitamins are duplicated in the Folbic tablet but can still take.

## 2012-08-23 ENCOUNTER — Encounter: Payer: Self-pay | Admitting: Cardiovascular Disease

## 2012-08-23 ENCOUNTER — Ambulatory Visit (INDEPENDENT_AMBULATORY_CARE_PROVIDER_SITE_OTHER): Payer: 59 | Admitting: Cardiovascular Disease

## 2012-08-23 VITALS — BP 134/94 | HR 66 | Ht 76.0 in | Wt 243.8 lb

## 2012-08-23 DIAGNOSIS — I2510 Atherosclerotic heart disease of native coronary artery without angina pectoris: Secondary | ICD-10-CM

## 2012-08-23 DIAGNOSIS — E785 Hyperlipidemia, unspecified: Secondary | ICD-10-CM

## 2012-08-23 DIAGNOSIS — Z85820 Personal history of malignant melanoma of skin: Secondary | ICD-10-CM

## 2012-08-23 DIAGNOSIS — I252 Old myocardial infarction: Secondary | ICD-10-CM

## 2012-08-23 NOTE — Progress Notes (Signed)
Patient ID: Alba Destine, male   DOB: 24-Jan-1955, 57 y.o.   MRN: [id number]     HPI: VICTORIANO CAMPION, is a 57 y.o. male who presents to the office for six-month cardiology evaluation.  Mr. Bedoy is a 57 year old gentleman who suffered a large internal myocardial infarction in June 2004 at which time he was found to have total occlusion of his proximal LAD. He underwent successful stenting of his proximal LAD with a 3.5x20 mm Cypher stent postdilated to 4.1 we'll centimeters. In June 2009 he was found to have progressive disease in the mid LAD and additional 2.75x18 mm Cypher stent was placed, postdilated to 3.35 to 3.25 taper. In addition, a 2.75x13 mm stent was placed in the PLA branch of his right coronary artery postdilated 3.25 mm. last several years he has remained stable. His last laboratory in March 2014 showed excellent cholesterol at 133 triglycerides 132 HDL 54 LDL 53. An echo Doppler study in 2011 showed an ejection fraction of 40% with mild aortic valve sclerosis with anterior apical moderate to severe hypokinesis. His last nuclear perfusion study was done September 2013 which showed old the distal apical anterior scar without ischemia. Over the past 6 months, he has felt fairly well. He remains active. 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 He denies any episodes of chest pain. He denies palpitations. He denies presyncope  Past Medical History  Diagnosis Date  . ASHD (arteriosclerotic heart disease)     stent  . Dyslipidemia   . Hyperlipidemia   . Melanoma     History reviewed. No pertinent past surgical history.  No Known Allergies  Current Outpatient Prescriptions  Medication Sig Dispense Refill  . aspirin 81 MG tablet Take 81 mg by mouth daily.        . clopidogrel (PLAVIX) 75 MG tablet Take 75 mg by mouth daily.        Marland Kitchen ezetimibe-simvastatin (VYTORIN) 10-40 MG per tablet Take 1 tablet by mouth at bedtime.        . nebivolol (BYSTOLIC) 2.5 MG tablet Take 2.5 mg by mouth daily.        . ramipril (ALTACE) 2.5 MG tablet Take 2.5 mg by mouth 2 (two) times daily.       Marland Kitchen thiamine (VITAMIN B-1) 100 MG tablet Take 100 mg by mouth daily.        . FOLBIC 2.5-25-2 MG TABS Take 1 tablet by mouth daily.       No current facility-administered medications for this visit.    History   Social History  . Marital Status: Married    Spouse Name: N/A    Number of Children: N/A  . Years of Education: N/A   Occupational History  . Not on file.   Social History Main Topics  . Smoking status: Never Smoker   . Smokeless tobacco: Not on file  . Alcohol Use: Yes     Comment: rare  . Drug Use: No  . Sexual Activity: Yes   Other Topics Concern  . Not on file   Social History Narrative  . No narrative on file   Socially he is married with no children. There is no tobacco use. He does drink occasional alcohol. He recently bought a home  in [place] has been spending time at [employer]  Family history is notable that both parents are alive, mother age 31 father age 78. He is a  brother age 43 and a sister age 40. Maternal grandmother suffered an MI and died at 12  ROS is negative for fevers, chills or night sweats.  He denies palpitations. Denies wheezing. He denies any significant weight loss. He denies PND or orthopnea. Denies change in bowel or bladder habits. He denies claudication. There is no edema the Other system review is negative.  PE BP 134/94  Pulse 66  Ht 6\' 4"  (1.93 m)  Wt 243 lb 12.8 oz (110.587 kg)  BMI 29.69 kg/m2  Repeat blood pressure by me was 110/80. General: Alert, oriented, no distress.  Skin: normal turgor, no rashes HEENT: Normocephalic, atraumatic. Pupils round and reactive; sclera anicteric;no lid lag.  Nose without nasal septal hypertrophy Mouth/Parynx benign; Mallinpatti scale 2/3 Neck: No JVD, no carotid briuts Lungs: clear to ausculatation and percussion; no wheezing or  rales Heart: RRR, s1 s2 normal 1/6 systolic murmur compatible with his documented mild aortic valve sclerosis the Abdomen: Mild central adiposity; soft, nontender; no hepatosplenomehaly, BS+; abdominal aorta nontender and not dilated by palpation. Pulses 2+ Extremities: no clubbing cyanosis or edema, Homan's sign negative  Neurologic: grossly nonfocal  ECG: Sinus rhythm at 66 beats per minute. Perineural 184 ms. Poor anterograde progression compatible with his old injury or myocardial infarction.  LABS:  BMET    Component Value Date/Time   NA 139 06/09/2007 0335   K 3.8 06/09/2007 0335   CL 108 06/09/2007 0335   CO2 25 06/09/2007 0335   GLUCOSE 105* 06/09/2007 0335   BUN 10 06/09/2007 0335   CREATININE 1.27 06/09/2007 0335   CALCIUM 8.9 06/09/2007 0335   GFRNONAA 59* 06/09/2007 0335   GFRAA  Value: >60        The eGFR has been calculated using the MDRD equation. This calculation has not been validated in all clinical 06/09/2007 0335     Hepatic Function Panel  No results found for this basename: prot, albumin, ast, alt, alkphos, bilitot, bilidir, ibili     CBC    Component Value Date/Time   WBC 7.8 06/09/2007 0335   RBC 4.30 06/09/2007 0335   HGB 13.6 06/09/2007 0335   HCT 39.7 06/09/2007 0335   PLT 147* 06/09/2007 0335   MCV 92.3 06/09/2007 0335   MCHC 34.4 06/09/2007 0335   RDW 14.2 06/09/2007 0335   LYMPHSABS 1.1 06/07/2007 1520   MONOABS 1.0 06/07/2007 1520   EOSABS 0.1 06/07/2007 1520   BASOSABS 0.1 06/07/2007 1520     BNP No results found for this basename: probnp    Lipid Panel  No results found for this basename: chol, trig, hdl, cholhdl, vldl, ldlcalc     RADIOLOGY: No results found.    ASSESSMENT AND PLAN: From a cardiac standpoint, Mr. Bowland is doing well. He he suffered a large anterolateral myocardial infarction in June 2004 and underwent stenting of his proximal occluded LAD. In June 2009 a new LAD stent was inserted beyond the initially placed stent and he also underwent stenting  of his PLA branch. Over the past 5 years he remains stable. Bids have been aggressively treated. He denies any CHF symptoms. He denies any chest pain. There are no palpitations. We did discuss increased exercise and weight loss. Repeat blood pressure is well-controlled on Altase 5 mg daily and a 2.5 twice a day regimen in addition to his low dose Bystolic. He's been maintained on total endplate with therapy with aspirin and Plavix. He also is on Vytorin 10/40. As always he remains stable, I  will see him in 6 month followup evaluation prior to that office visit he wanted a one-year followup laboratory.    Lennette Bihari, MD, [facility]  08/23/2012 9:27 AM

## 2012-08-23 NOTE — Patient Instructions (Addendum)
Your physician wants you to follow-up in:  6 months. You will receive a reminder letter in the mail two months in advance. If you don't receive a letter, please call our office to schedule the follow-up appointment.   

## 2012-10-27 ENCOUNTER — Encounter: Payer: Self-pay | Admitting: Internal Medicine

## 2013-01-10 ENCOUNTER — Encounter: Payer: Self-pay | Admitting: Family Medicine

## 2013-01-10 ENCOUNTER — Ambulatory Visit (INDEPENDENT_AMBULATORY_CARE_PROVIDER_SITE_OTHER): Payer: 59 | Admitting: Family Medicine

## 2013-01-10 VITALS — BP 120/84 | HR 80 | Wt 250.0 lb

## 2013-01-10 DIAGNOSIS — G5600 Carpal tunnel syndrome, unspecified upper limb: Secondary | ICD-10-CM

## 2013-01-10 DIAGNOSIS — M129 Arthropathy, unspecified: Secondary | ICD-10-CM

## 2013-01-10 DIAGNOSIS — M1990 Unspecified osteoarthritis, unspecified site: Secondary | ICD-10-CM

## 2013-01-10 NOTE — Progress Notes (Signed)
   Subjective:    Patient ID: Brandon Frederick, male    DOB: [birth year], 58 y.o.   MRN: [id number]  HPI The last several months he notes worsening pain and weakness in his wrist and hand, right greater than left. 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 He has a previous history of carpal tunnel syndrome. He also notes numbness at night in the same distribution. He has been using splints at night which does help. He also complains of PIPpain in the first and second digits. This bothersome especially after he has worked all day outside on cars. He is retired but does this for fun. He does followup with his cardiologist on regular basis.   Review of Systems     Objective:   Physical Exam Exam of both wrists shows some slight hypertrophy in the PIP joint especially right thumb. Tinel's test was negative but Phalen's was positive on the right.       Assessment & Plan:  Carpal tunnel syndrome, unspecified laterality  Arthritis  Discussed treatment of carpal tunnel syndrome going from conservative to aggressive including splinting, anti-inflammatories, wrist position, injection and potentially surgery. He definitely wants to be very conservative with this. Also discussed treatment of arthritis including Tylenol and NSAID. We will treat both of these conservatively and possibly move to injection at his request.

## 2013-01-10 NOTE — Patient Instructions (Signed)
Keep your wrist in neutral position as much is possible for the carpal tunnel. Start with Tylenol and then either add Advil or Aleve or take it separately and you can double the dose of Advil or Aleve. The maximum dosing for Advil 4 tablets 3 times per day

## 2013-02-24 ENCOUNTER — Ambulatory Visit (INDEPENDENT_AMBULATORY_CARE_PROVIDER_SITE_OTHER): Payer: 59 | Admitting: Cardiovascular Disease

## 2013-02-24 VITALS — BP 110/80 | HR 60 | Ht 75.0 in | Wt 242.9 lb

## 2013-02-24 DIAGNOSIS — I252 Old myocardial infarction: Secondary | ICD-10-CM

## 2013-02-24 DIAGNOSIS — I10 Essential (primary) hypertension: Secondary | ICD-10-CM

## 2013-02-24 DIAGNOSIS — I2510 Atherosclerotic heart disease of native coronary artery without angina pectoris: Secondary | ICD-10-CM

## 2013-02-24 DIAGNOSIS — E782 Mixed hyperlipidemia: Secondary | ICD-10-CM

## 2013-02-24 DIAGNOSIS — Z79899 Other long term (current) drug therapy: Secondary | ICD-10-CM

## 2013-02-24 DIAGNOSIS — E785 Hyperlipidemia, unspecified: Secondary | ICD-10-CM

## 2013-02-24 NOTE — Patient Instructions (Signed)
Your physician recommends that you return for lab work fasting. ( [facility])   Your physician recommends that you schedule a follow-up appointment in: 6 month.

## 2013-02-26 ENCOUNTER — Encounter: Payer: Self-pay | Admitting: Cardiovascular Disease

## 2013-02-26 DIAGNOSIS — I10 Essential (primary) hypertension: Secondary | ICD-10-CM | POA: Insufficient documentation

## 2013-02-26 NOTE — Progress Notes (Signed)
Patient ID: Brandon Frederick, male   DOB: Apr 16, 1955, 58 y.o.   MRN: [id number]      HPI: Brandon Frederick is a 58 y.o. male who presents to the office for six-month cardiology evaluation.  Brandon Frederick is a 58 year old gentleman who suffered a large internal myocardial infarction in June 2004 at which time he was found to have total occlusion of his proximal LAD. He underwent successful stenting of his proximal LAD with a 3.5x20 mm Cypher stent postdilated to 4.57m.  In June 2009 he was found to have progressive disease in the mid LAD and additional 2.75x18 mm Cypher stent was placed, postdilated to 3.35 to 3.25 taper. In addition, a 2.75x13 mm stent was placed in the PLA branch of his right coronary artery postdilated 3.25 mm. Over the last several years he has remained stable without recurrent chest pain. His last laboratory in March 2014 showed excellent cholesterol at 133 triglycerides 132 HDL 54 LDL 53. An echo Doppler study in 2011 showed an ejection fraction of 40% with mild aortic valve sclerosis with anterior apical moderate to severe hypokinesis. His last nuclear perfusion study was done September 2013 which showed old the distal apical anterior scar without ischemia. Over the past 6 months, he has felt fairly well. He remains active. 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 He denies palpitations. He denies presyncope. At times, his wife states that he is forgetful and was wondering if this could be the result of some of his medication.  Past Medical History  Diagnosis Date  . ASHD (arteriosclerotic heart disease)     stent  . Dyslipidemia   . Hyperlipidemia   . Melanoma     History reviewed. No pertinent past surgical history.  No Known Allergies  Current Outpatient Prescriptions  Medication Sig Dispense Refill  . aspirin 81 MG tablet Take 81 mg by mouth daily.        . clopidogrel  (PLAVIX) 75 MG tablet Take 75 mg by mouth daily.        .Marland Kitchenezetimibe-simvastatin (VYTORIN) 10-40 MG per tablet Take 1 tablet by mouth at bedtime.      . FOLBIC 2.5-25-2 MG TABS Take 1 tablet by mouth daily.      . nebivolol (BYSTOLIC) 2.5 MG tablet Take 2.5 mg by mouth daily.        . ramipril (ALTACE) 2.5 MG tablet Take 2.5 mg by mouth 2 (two) times daily.        No current facility-administered medications for this visit.    History   Social History  . Marital Status: Married    Spouse Name: N/A    Number of Children: N/A  . Years of Education: N/A   Occupational History  . Not on file.   Social History Main Topics  . Smoking status: Never Smoker   . Smokeless tobacco: Not on file  . Alcohol Use: Yes     Comment: rare  . Drug Use: No  . Sexual Activity: Yes   Other Topics Concern  . Not on file   Social History Narrative  . No narrative on file   Socially he is married with no children. There is no tobacco use. He does drink occasional alcohol. [place] that both parents are alive, mother  age 73 father age 57. He is a brother age 22 and a sister age 58. Maternal grandmother suffered an MI and died at 28  ROS is negative for fevers, chills or night sweats.  He denies change in vision or hearing. He denies rash. He denies lymphadenopathy. He denies palpitations. There is no presyncope or syncope. Denies wheezing. He denies any significant weight loss. He denies PND or orthopnea. [place] chest pain. He denies nausea vomiting or diarrhea. Denies change in bowel or bladder habits. There is no bleeding. He denies claudication. He denies cold or heat intolerance. He continues to work on his cars. There is no edema. Other comprehensive 14 point system review is negative.  PE BP 110/80  Pulse 60  Ht '6\' 3"'  (1.905 m)  Wt 242 lb 14.4 oz (110.179 kg)  BMI 30.36 kg/m2  Repeat blood pressure by  me was 110/80. General: Alert, oriented, no distress.  Skin: normal turgor, no rashes HEENT: Normocephalic, atraumatic. Pupils round and reactive; sclera anicteric;no lid lag.  Nose without nasal septal hypertrophy Mouth/Parynx benign; Mallinpatti scale 2/3 Neck: No JVD, no carotid bruits with normal carotid upstroke Lungs: clear to ausculatation and percussion; no wheezing or rales Chest wall: No tenderness to palpation Heart: RRR, s1 s2 normal 1/6 systolic murmur compatible with his documented mild aortic valve sclerosis; no diastolic murmur. No rubs thrills or heaves. Abdomen: Mild central adiposity; soft, nontender; no hepatosplenomehaly, BS+; abdominal aorta nontender and not dilated by palpation. Back: No CVA tenderness Pulses 2+ Extremities: no clubbing cyanosis or edema, Homan's sign negative  Neurologic: grossly nonfocal; cranial nerves grossly normal Psychological: Normal affect and mood.  ECG (independently read by me): Sinus rhythm at 60 beats per minute. Anterior Q waves V1 through V4 compatible with his prior anterior wall myocardial infarction. QTc interval 470.  Prior ECG of 08/23/2012: Sinus rhythm at 66 beats per minute. Perineural 184 ms. Poor anterograde progression compatible with his old injury or myocardial infarction.  LABS:  BMET    Component Value Date/Time   NA 139 06/09/2007 0335   K 3.8 06/09/2007 0335   CL 108 06/09/2007 0335   CO2 25 06/09/2007 0335   GLUCOSE 105* 06/09/2007 0335   BUN 10 06/09/2007 0335   CREATININE 1.27 06/09/2007 0335   CALCIUM 8.9 06/09/2007 0335   GFRNONAA 59* 06/09/2007 0335   GFRAA  Value: >60        The eGFR has been calculated using the MDRD equation. This calculation has not been validated in all clinical 06/09/2007 0335     Hepatic Function Panel  No results found for this basename: prot,  albumin,  ast,  alt,  alkphos,  bilitot,  bilidir,  ibili     CBC    Component Value Date/Time   WBC 7.8 06/09/2007 0335   RBC 4.30 06/09/2007 0335    HGB 13.6 06/09/2007 0335   HCT 39.7 06/09/2007 0335   PLT 147* 06/09/2007 0335   MCV 92.3 06/09/2007 0335   MCHC 34.4 06/09/2007 0335   RDW 14.2 06/09/2007 0335   LYMPHSABS 1.1 06/07/2007 1520   MONOABS 1.0 06/07/2007 1520   EOSABS 0.1 06/07/2007 1520   BASOSABS 0.1 06/07/2007 1520     BNP No results found for this basename: probnp    Lipid Panel  No results found for this basename: chol,  trig,  hdl,  cholhdl,  vldl,  ldlcalc     RADIOLOGY: No results found.    ASSESSMENT AND PLAN:  Mr. Acy is  is almost 11 years since suffering a large anterior wall myocardial infarction  in June 2004 and underwent stenting of his proximal occluded LAD. In June 2009 a new LAD stent was inserted beyond the initially placed stent and he also underwent stenting of his PLA branch. Over the past 6 years, he has remained fairly stable without recurrent anginal symptoms. He is maintaining dual antiplatelet therapy with aspirin 81 mg and Plavix 75 mg. He is on Vytorin 10/44 hyperlipidemia. His blood pressure has been well-controlled and he is tolerating all Tase at 2.5 mg twice a day. He's not having CHF symptoms. He's not having any palpitations under a low dose Bystolic 2.5 mg for beta blocker therapy. His blood pressure has limited further more aggressive titration of his beta blocker and ramipril. We have been aggressive with treating his lipid therapy. He will obtain a new set of laboratories which we'll obtain it [facility] and our review these and adjustments will be made if necessary to his medical regimen. I am doubtful that his minimal memory loss is due to his medications but also discussed with him the importance of continued aggressive lipid lowering therapy since he did sustain a moderate amount of anterior and anterolateral scar and the importance of inducing plaque regression with statin therapy. I will see him in 6 months for cardiology reevaluation.  Time spent: 25 minutes  Troy Sine, MD, [facility]    02/26/2013 11:15 AM

## 2013-03-14 ENCOUNTER — Encounter: Payer: Self-pay | Admitting: Family Medicine

## 2013-03-16 LAB — CBC
HCT: 44.2 % (ref 37.5–51.0)
Hemoglobin: 15.3 g/dL (ref 12.6–17.7)
MCH: 31.2 pg (ref 26.6–33.0)
MCHC: 34.6 g/dL (ref 31.5–35.7)
MCV: 90 fL (ref 79–97)
PLATELETS: 167 10*3/uL (ref 150–379)
RBC: 4.9 x10E6/uL (ref 4.14–5.80)
RDW: 14 % (ref 12.3–15.4)
WBC: 7.3 10*3/uL (ref 3.4–10.8)

## 2013-03-16 LAB — COMPREHENSIVE METABOLIC PANEL
ALBUMIN: 4.5 g/dL (ref 3.5–5.5)
ALT: 45 IU/L — ABNORMAL HIGH (ref 0–44)
AST: 27 IU/L (ref 0–40)
Albumin/Globulin Ratio: 2.6 — ABNORMAL HIGH (ref 1.1–2.5)
Alkaline Phosphatase: 72 IU/L (ref 39–117)
BUN/Creatinine Ratio: 14 (ref 9–20)
BUN: 15 mg/dL (ref 6–24)
CHLORIDE: 101 mmol/L (ref 97–108)
CO2: 23 mmol/L (ref 18–29)
CREATININE: 1.05 mg/dL (ref 0.76–1.27)
Calcium: 9.2 mg/dL (ref 8.7–10.2)
GFR calc Af Amer: 89 mL/min/{1.73_m2} (ref >59)
GFR calc non Af Amer: 77 mL/min/{1.73_m2} (ref >59)
Globulin, Total: 1.7 g/dL (ref 1.5–4.5)
Glucose: 100 mg/dL — ABNORMAL HIGH (ref 65–99)
Potassium: 4.5 mmol/L (ref 3.5–5.2)
SODIUM: 140 mmol/L (ref 134–144)
Total Bilirubin: 0.7 mg/dL (ref 0.0–1.2)
Total Protein: 6.2 g/dL (ref 6.0–8.5)

## 2013-03-16 LAB — LIPID PANEL
Chol/HDL Ratio: 2.2 ratio units (ref 0.0–5.0)
Cholesterol, Total: 112 mg/dL (ref 100–199)
HDL: 50 mg/dL (ref >39)
LDL CALC: 41 mg/dL (ref 0–99)
TRIGLYCERIDES: 104 mg/dL (ref 0–149)
VLDL CHOLESTEROL CAL: 21 mg/dL (ref 5–40)

## 2013-03-16 LAB — TSH: TSH: 3.28 u[IU]/mL (ref 0.450–4.500)

## 2013-03-21 ENCOUNTER — Other Ambulatory Visit: Payer: Self-pay | Admitting: *Deleted

## 2013-03-21 MED ORDER — FA-PYRIDOXINE-CYANOCOBALAMIN 2.5-25-2 MG PO TABS: 1.0000 | ORAL_TABLET | Freq: Every day | ORAL | Status: DC

## 2013-03-21 NOTE — Telephone Encounter (Signed)
Rx was sent to pharmacy electronically. 

## 2013-03-28 ENCOUNTER — Other Ambulatory Visit: Payer: Self-pay

## 2013-03-28 MED ORDER — EZETIMIBE-SIMVASTATIN 10-40 MG PO TABS: 1.0000 | ORAL_TABLET | Freq: Every day | ORAL | Status: DC

## 2013-03-28 NOTE — Telephone Encounter (Signed)
Rx was sent to pharmacy electronically. 

## 2013-05-11 ENCOUNTER — Other Ambulatory Visit: Payer: Self-pay | Admitting: Cardiovascular Disease

## 2013-05-11 NOTE — Telephone Encounter (Signed)
Rx was sent to pharmacy electronically. 

## 2013-05-13 ENCOUNTER — Encounter: Payer: Self-pay | Admitting: Family Medicine

## 2013-05-23 ENCOUNTER — Encounter: Payer: Self-pay | Admitting: Cardiovascular Disease

## 2013-05-26 ENCOUNTER — Other Ambulatory Visit: Payer: Self-pay | Admitting: *Deleted

## 2013-05-26 ENCOUNTER — Telehealth: Payer: Self-pay | Admitting: Cardiovascular Disease

## 2013-05-26 MED ORDER — NITROGLYCERIN 0.4 MG SL SUBL: 0.4000 mg | SUBLINGUAL_TABLET | SUBLINGUAL | Status: DC | PRN

## 2013-05-26 NOTE — Telephone Encounter (Signed)
Need a new prescription for nitro,pt did not know the milligram or quainity.Please call to [facility] on [address]

## 2013-05-26 NOTE — Telephone Encounter (Signed)
Med refilled.

## 2013-05-27 ENCOUNTER — Telehealth: Payer: Self-pay | Admitting: Cardiovascular Disease

## 2013-05-27 ENCOUNTER — Other Ambulatory Visit: Payer: Self-pay | Admitting: *Deleted

## 2013-05-27 MED ORDER — NITROGLYCERIN 0.4 MG SL SUBL: 0.4000 mg | SUBLINGUAL_TABLET | SUBLINGUAL | Status: DC | PRN

## 2013-05-27 NOTE — Telephone Encounter (Signed)
Reordered patient's Nitrostat. E-scribed to [facility]. Previously sent to the wrong pharmacy yesterday by JC.

## 2013-05-27 NOTE — Telephone Encounter (Signed)
Patient wanted [facility]  Not mail order  Please call

## 2013-05-27 NOTE — Telephone Encounter (Signed)
Refilled patient's nitrostat to [facility] [address]

## 2013-05-31 ENCOUNTER — Ambulatory Visit (INDEPENDENT_AMBULATORY_CARE_PROVIDER_SITE_OTHER): Payer: 59 | Admitting: Family Medicine

## 2013-05-31 ENCOUNTER — Encounter: Payer: Self-pay | Admitting: Family Medicine

## 2013-05-31 VITALS — BP 112/90 | HR 60 | Wt 242.0 lb

## 2013-05-31 DIAGNOSIS — E291 Testicular hypofunction: Secondary | ICD-10-CM

## 2013-05-31 DIAGNOSIS — R7989 Other specified abnormal findings of blood chemistry: Secondary | ICD-10-CM

## 2013-05-31 DIAGNOSIS — N529 Male erectile dysfunction, unspecified: Secondary | ICD-10-CM

## 2013-05-31 NOTE — Progress Notes (Signed)
   Subjective:    Patient ID: [name], male    DOB: November 29, 1955, 58 y.o.   MRN: [id number]  HPI He is here for consult concerning ED. he was apparently seen in a man's clinic in [place] and given sublingual Levitra which worked however he had visual changes, malaise and flushed sensation. He apparently discuss with him the use of injectable medication to help with his erections however he is not at all interested in that. He apparently also check a testosterone he states that it was below 200. He is here for followup on this as well.  Review of Systems     Objective:   Physical Exam Alert and in no distress otherwise not examined       Assessment & Plan:  Low testosterone - Plan: CANCELED: Testosterone  ED (erectile dysfunction) of organic origin  I discussed the low testosterone in regard to libido and its relation to his sexual function. Explained that even normal testosterone levels does not necessarily mean he will get an erection. I told him that I could not explain why the sublingual tended to work where his oral medications in the past have not. I did give him a sample of Cialis 20 mg. He will have blood drawn for testosterone and we will then followup. Also explained that he needs to avoid using nitroglycerin when he uses his Cialis. He expressed understanding.

## 2013-06-08 ENCOUNTER — Encounter: Payer: Self-pay | Admitting: Family Medicine

## 2013-07-14 ENCOUNTER — Other Ambulatory Visit: Payer: Self-pay | Admitting: Cardiovascular Disease

## 2013-07-15 ENCOUNTER — Telehealth: Payer: Self-pay | Admitting: Family Medicine

## 2013-07-15 NOTE — Telephone Encounter (Signed)
Pt states with new ins [employer] (copy scanned) that needs referrals for all appts with any other doctor than primary. Needs referrals for Dr. Claiborne Billings Cardiologist & Dr. Allyson [facility], has upcoming appts with all of these doctors.

## 2013-07-15 NOTE — Telephone Encounter (Signed)
I HAVE CALLED THE INS.COMPANY TWO TIMES TODAY THE FIRST TIME I STAYED 20 MIN ON HOLD WITH NO ANSWER SECOND TIME I STAYED 10 MIN THEY CONTINUE TO SAY THEY ARE HAVING A LOT OF CALLS TO CALL BACK ANOTHER DAY

## 2013-07-18 NOTE — Telephone Encounter (Signed)
I got intouch this morning with ins. Company and i have to go to [email or web address] and put referrals in for pt

## 2013-07-20 ENCOUNTER — Encounter: Payer: Self-pay | Admitting: Family Medicine

## 2013-07-20 NOTE — Telephone Encounter (Signed)
I HAVE MADE ALL 3 OF HIS REFERRALS

## 2013-08-07 ENCOUNTER — Encounter: Payer: Self-pay | Admitting: Cardiovascular Disease

## 2013-08-31 ENCOUNTER — Encounter: Payer: Self-pay | Admitting: Cardiovascular Disease

## 2013-08-31 ENCOUNTER — Ambulatory Visit (INDEPENDENT_AMBULATORY_CARE_PROVIDER_SITE_OTHER): Payer: 59 | Admitting: Cardiovascular Disease

## 2013-08-31 VITALS — BP 122/78 | HR 64 | Ht 75.0 in | Wt 245.3 lb

## 2013-08-31 DIAGNOSIS — I10 Essential (primary) hypertension: Secondary | ICD-10-CM

## 2013-08-31 DIAGNOSIS — E785 Hyperlipidemia, unspecified: Secondary | ICD-10-CM

## 2013-08-31 DIAGNOSIS — I2510 Atherosclerotic heart disease of native coronary artery without angina pectoris: Secondary | ICD-10-CM

## 2013-08-31 DIAGNOSIS — I252 Old myocardial infarction: Secondary | ICD-10-CM

## 2013-08-31 NOTE — Progress Notes (Signed)
Patient ID: Brandon Frederick, male   DOB: 05/28/1955, 58 y.o.   MRN: [id number]      HPI: Brandon Frederick is a 58 y.o. male who presents to the office for six-month cardiology evaluation.  Brandon Frederick is a 58 year old gentleman who suffered a large anterior wall myocardial infarction in June 2004 due to total occlusion of his proximal LAD. He underwent successful stenting of his proximal LAD with a 3.5x20 mm Cypher stent postdilated to 4.20mm.  In June 2009 he was found to have progressive disease in the mid LAD and additional 2.75x18 mm Cypher stent was placed, postdilated to 3.35 to 3.25 taper. In addition, a 2.75x13 mm stent was placed in the PLA branch of his right coronary artery postdilated 3.25 mm. Over the last several years he has remained stable without recurrent chest pain. His last laboratory in March 2014 showed excellent cholesterol at 133 triglycerides 132 HDL 54 LDL 53. An echo Doppler study in 2011 showed an ejection fraction of 40% with mild aortic valve sclerosis with anterior apical moderate to severe hypokinesis. His last nuclear perfusion study was done September 2013 which showed old the distal apical anterior scar without ischemia. Over the past 6 months, he has felt fairly well. He remains active. He works on cars.  He denies any episodes of chest pain or palpitations.  He denies dizziness.  He denies presyncope or syncope.  At times he notes transient numbness of his right lateral thigh.  Past Medical History  Diagnosis Date  . ASHD (arteriosclerotic heart disease)     stent  . Dyslipidemia   . Hyperlipidemia   . Melanoma     History reviewed. No pertinent past surgical history.  No Known Allergies  Current Outpatient Prescriptions  Medication Sig Dispense Refill  . aspirin 81 MG tablet Take 81 mg by mouth daily.        Marland Kitchen BYSTOLIC 2.5 MG tablet Take 1 tablet by mouth  daily  90 tablet  2  . clopidogrel (PLAVIX) 75 MG tablet Take 1 tablet by mouth  daily  90 tablet  2  .  ezetimibe-simvastatin (VYTORIN) 10-40 MG per tablet Take 1 tablet by mouth at bedtime.  90 tablet  3  . nitroGLYCERIN (NITROSTAT) 0.4 MG SL tablet Place 1 tablet (0.4 mg total) under the tongue every 5 (five) minutes as needed for chest pain.  30 tablet  3  . NITROSTAT 0.4 MG SL tablet Dissolve 1 tablet under the tongue every 5 minutes as  needed for chest pain as  directed  25 tablet  1  . ramipril (ALTACE) 2.5 MG tablet Take 2.5 mg by mouth 2 (two) times daily.        No current facility-administered medications for this visit.    History   Social History  . Marital Status: Married    Spouse Name: N/A    Number of Children: N/A  . Years of Education: N/A   Occupational History  . Not on file.   Social History Main Topics  . Smoking status: Never Smoker   . Smokeless tobacco: Not on file  . Alcohol Use: Yes     Comment: rare  . Drug Use: No  . Sexual Activity: Yes   Other Topics Concern  . Not on file   Social History Narrative  . No narrative on file   Socially he is married with no children. There is no tobacco use. He does drink occasional alcohol. He recently bought a home  in [place] has been spending time at the [place] history is notable that both parents are alive, mother age 15 father age 81. He is a brother age 45 and a sister age 10. Maternal grandmother suffered an MI and died at 39  ROS General: Negative; No fevers, chills, or night sweats;  HEENT: Negative; No changes in vision or hearing, sinus congestion, difficulty swallowing Pulmonary: Negative; No cough, wheezing, shortness of breath, hemoptysis Cardiovascular: Negative; No chest pain, presyncope, syncope, palpitations GI: Negative; No nausea, vomiting, diarrhea, or abdominal pain GU: Negative; No dysuria, hematuria, or difficulty voiding Musculoskeletal: Negative; no myalgias, joint pain, or weakness Hematologic/Oncology: Negative; no easy bruising, bleeding Endocrine: Negative; no heat/cold  intolerance; no diabetes Neuro: Transient right lateral thigh paresthesias; no changes in balance, headaches Skin: Negative; No rashes or skin lesions Psychiatric: Negative; No behavioral problems, depression Sleep: Negative; No snoring, daytime sleepiness, hypersomnolence, bruxism, restless legs, hypnogognic hallucinations, no cataplexy Other comprehensive 14 point system review is negative.   PE BP 122/78  Pulse 64  Ht 6\' 3"  (1.905 m)  Wt 245 lb 4.8 oz (111.267 kg)  BMI 30.66 kg/m2  Repeat blood pressure by me was 110/80. General: Alert, oriented, no distress.  Skin: normal turgor, no rashes HEENT: Normocephalic, atraumatic. Pupils round and reactive; sclera anicteric;no lid lag.  Nose without nasal septal hypertrophy Mouth/Parynx benign; Mallinpatti scale 2/3 Neck: No JVD, no carotid bruits with normal carotid upstroke Lungs: clear to ausculatation and percussion; no wheezing or rales Chest wall: No tenderness to palpation Heart: RRR, s1 s2 normal 1/6 systolic murmur compatible with his documented mild aortic valve sclerosis; no diastolic murmur. No rubs thrills or heaves. Abdomen: Mild central adiposity; soft, nontender; no hepatosplenomehaly, BS+; abdominal aorta nontender and not dilated by palpation. Back: No CVA tenderness Pulses 2+ Extremities: no clubbing cyanosis or edema, Homan's sign negative  Neurologic: grossly nonfocal; cranial nerves grossly normal Psychological: Normal affect and mood.  ECG (independently read by me): Sinus rhythm at 64 beats per minute.  Old anterior wall myocardial infarction.  QTc interval 429 ms. ,  Nonspecific T. change  02/24/2013 ECG (independently read by me): Sinus rhythm at 60 beats per minute. Anterior Q waves V1 through V4 compatible with his prior anterior wall myocardial infarction. QTc interval 470.  Prior ECG of 08/23/2012: Sinus rhythm at 66 beats per minute. Perineural 184 ms. Poor anterograde progression compatible with his old  injury or myocardial infarction.  LABS:  BMET    Component Value Date/Time   NA 140 03/15/2013 0828   NA 139 06/09/2007 0335   K 4.5 03/15/2013 0828   CL 101 03/15/2013 0828   CO2 23 03/15/2013 0828   GLUCOSE 100* 03/15/2013 0828   GLUCOSE 105* 06/09/2007 0335   BUN 15 03/15/2013 0828   BUN 10 06/09/2007 0335   CREATININE 1.05 03/15/2013 0828   CALCIUM 9.2 03/15/2013 0828   GFRNONAA 77 03/15/2013 0828   GFRAA 89 03/15/2013 0828     Hepatic Function Panel     Component Value Date/Time   PROT 6.2 03/15/2013 0828     CBC    Component Value Date/Time   WBC 7.3 03/15/2013 0828   WBC 7.8 06/09/2007 0335   RBC 4.90 03/15/2013 0828   RBC 4.30 06/09/2007 0335   HGB 15.3 03/15/2013 0828   HCT 44.2 03/15/2013 0828   PLT 167 03/15/2013 0828   MCV 90 03/15/2013 0828   MCH 31.2 03/15/2013 0828   MCHC 34.6 03/15/2013 0828  MCHC 34.4 06/09/2007 0335   RDW 14.0 03/15/2013 0828   RDW 14.2 06/09/2007 0335   LYMPHSABS 1.1 06/07/2007 1520   MONOABS 1.0 06/07/2007 1520   EOSABS 0.1 06/07/2007 1520   BASOSABS 0.1 06/07/2007 1520     BNP No results found for this basename: probnp    Lipid Panel  No results found for this basename: chol,  trig,  hdl,  cholhdl,  vldl,  ldlcalc     RADIOLOGY: No results found.    ASSESSMENT AND PLAN: Brandon Frederick is  11 years since suffering a large anterior wall myocardial infarction  in June 2004 and underwent stenting of his proximal occluded LAD. In June 2009 a new LAD stent was inserted beyond the initially placed stent and he also underwent stenting of his PLA branch. Over the past 6 years, he has remained fairly stable without recurrent anginal symptoms. He is maintaining dual antiplatelet therapy with aspirin 81 mg and Plavix 75 mg. He is on Vytorin 10/40 hyperlipidemia.  I did review laboratory that was done by Dr. Redmond School in March 2015.  Lipid studies were excellent with an LDL close to 41, total cholesterol 112, triglycerides 140, HDL 50.  His blood pressure has been  well-controlled and he is tolerating all ramipril at 2.5 mg twice a day. He's not having CHF symptoms. He's not having any palpitations under a low dose Bystolic 2.5 mg for beta blocker therapy. His blood pressure has limited further more aggressive titration of his beta blocker and ramipril.  He is not orthostatic on exam.  We have been aggressive with treating his lipid therapy.  He will his current medical regimen.  He will substitute his prescription version of B vitamins for Centrum silver. We discussed weight loss and continued aerobic exercise.  I will see him in one year for followup evaluation.    Time spent: 25 minutes  Troy Sine, MD, [facility]  08/31/2013 8:34 AM

## 2013-08-31 NOTE — Patient Instructions (Signed)
Your physician recommends that you schedule a follow-up appointment in: 1 year. No changes were made today in your therapy. 

## 2013-09-26 ENCOUNTER — Other Ambulatory Visit (INDEPENDENT_AMBULATORY_CARE_PROVIDER_SITE_OTHER): Payer: 59

## 2013-09-26 DIAGNOSIS — Z23 Encounter for immunization: Secondary | ICD-10-CM

## 2013-11-23 ENCOUNTER — Encounter: Payer: Self-pay | Admitting: Family Medicine

## 2013-11-23 ENCOUNTER — Ambulatory Visit (INDEPENDENT_AMBULATORY_CARE_PROVIDER_SITE_OTHER): Payer: 59 | Admitting: Family Medicine

## 2013-11-23 VITALS — BP 120/80 | HR 78 | Wt 239.0 lb

## 2013-11-23 DIAGNOSIS — M2041 Other hammer toe(s) (acquired), right foot: Secondary | ICD-10-CM

## 2013-11-23 NOTE — Progress Notes (Signed)
   Subjective:    Patient ID: Brandon Frederick, male    DOB: 23-Jun-1955, 58 y.o.   MRN: [id number]  HPI He is here for evaluation of a deformity of the right second toe. He remembers having some pain in that area several years ago and seeing a foot specialist. They apparently injected the area which got rid of the pain but since then he has noted a deformity.   Review of Systems     Objective:   Physical Exam Exam of the right second toe shows plantar deviation with lack of ability to fully extend. There is some slight discomfort in the metatarsal head area.       Assessment & Plan:  Hammertoe, right - Plan: Ambulatory referral to Podiatry I explained the probably had a Morton's neuroma and subsequent nerve damage causing damage to the extensor hood. Discussed various options and he would like to be referred to podiatry to look for potential surgical repair.

## 2013-11-24 ENCOUNTER — Encounter: Payer: Self-pay | Admitting: Family Medicine

## 2014-01-02 ENCOUNTER — Other Ambulatory Visit: Payer: Self-pay | Admitting: Cardiovascular Disease

## 2014-01-02 NOTE — Telephone Encounter (Signed)
Rx refill sent to patient pharmacy   

## 2014-01-09 ENCOUNTER — Other Ambulatory Visit: Payer: Self-pay | Admitting: Cardiovascular Disease

## 2014-01-09 NOTE — Telephone Encounter (Signed)
Rx(s) sent to pharmacy electronically.  

## 2014-01-17 ENCOUNTER — Other Ambulatory Visit: Payer: Self-pay | Admitting: Cardiovascular Disease

## 2014-02-01 ENCOUNTER — Encounter: Payer: Self-pay | Admitting: Family Medicine

## 2014-03-04 ENCOUNTER — Encounter: Payer: Self-pay | Admitting: Family Medicine

## 2014-03-09 ENCOUNTER — Telehealth: Payer: Self-pay | Admitting: Family Medicine

## 2014-03-09 ENCOUNTER — Other Ambulatory Visit: Payer: Self-pay

## 2014-03-09 DIAGNOSIS — Z1211 Encounter for screening for malignant neoplasm of colon: Secondary | ICD-10-CM

## 2014-03-09 NOTE — Telephone Encounter (Signed)
[place] called and states he had a colonoscopy 10 years ago with [facility].  Please let him know when.

## 2014-03-09 NOTE — Telephone Encounter (Signed)
I have put order in epic left pt message that it was in epic if he did not want to wait for them to call him that [phone] he could call them and get it scheduled

## 2014-03-22 ENCOUNTER — Other Ambulatory Visit: Payer: Self-pay | Admitting: Cardiovascular Disease

## 2014-03-22 MED ORDER — NEBIVOLOL HCL 2.5 MG PO TABS: 2.5000 mg | ORAL_TABLET | Freq: Every day | ORAL | Status: DC

## 2014-03-22 NOTE — Telephone Encounter (Signed)
Rx(s) sent to pharmacy electronically. Patient notified. 

## 2014-03-22 NOTE — Telephone Encounter (Signed)
°  1. Which medications need to be refilled? Bystolic-asap please going out of town  2. Which pharmacy is medication to be sent to?[facility]-[phone]? 3. Do they need a 30 day or 90 day supply? 30 and refills  4. Would they like a call back once the medication has been sent to the pharmacy? yes

## 2014-04-20 ENCOUNTER — Other Ambulatory Visit: Payer: Self-pay | Admitting: Cardiovascular Disease

## 2014-04-28 ENCOUNTER — Encounter: Payer: Self-pay | Admitting: *Deleted

## 2014-06-12 ENCOUNTER — Telehealth: Payer: Self-pay | Admitting: Cardiovascular Disease

## 2014-06-12 NOTE — Telephone Encounter (Signed)
Pt called in stating that his insurance will no longer cover his Vytorin and would like to be advised on which alternative medication he should take in the place of it. He said that he was given the option of Atorvastatin and Simvastatin. Please call advise   Thanks

## 2014-06-14 ENCOUNTER — Encounter: Payer: Self-pay | Admitting: Cardiovascular Disease

## 2014-06-14 NOTE — Telephone Encounter (Signed)
Spoke with patient and informed him of Dr. Evette Georges advice. He is working with his insurance company to see if they will cover medication and will let us know. He was informed of which medication and strength Dr. Claiborne Billings had recommended and sent him a message regarding this via MyChart

## 2014-06-14 NOTE — Telephone Encounter (Signed)
Try atorva 40 mg daily

## 2014-06-27 ENCOUNTER — Telehealth: Payer: Self-pay | Admitting: Cardiovascular Disease

## 2014-06-27 DIAGNOSIS — Z79899 Other long term (current) drug therapy: Secondary | ICD-10-CM

## 2014-06-27 DIAGNOSIS — E785 Hyperlipidemia, unspecified: Secondary | ICD-10-CM

## 2014-06-27 DIAGNOSIS — R5383 Other fatigue: Secondary | ICD-10-CM

## 2014-06-27 DIAGNOSIS — R7989 Other specified abnormal findings of blood chemistry: Secondary | ICD-10-CM

## 2014-06-27 MED ORDER — ATORVASTATIN CALCIUM 40 MG PO TABS: 40.0000 mg | ORAL_TABLET | Freq: Every day | ORAL | Status: DC

## 2014-06-27 NOTE — Telephone Encounter (Signed)
Per triage call from 6/6 to 6/8, Dr. Claiborne Billings advised that patient could change from vytorin to atorvastatin 40mg  daily - as insurance is requiring prior approval and this change will save the patient $150/month. He would like to make this change. He will have fasting labs done prior to office visit in September with Dr. Claiborne Billings to review labs and review cholesterol trends to make sure medication change is appropriately controlling his numbers.   Rx(s) sent to pharmacy electronically. Labs ordered (CBC, CMET, TSH, lipid - per last labs ordered by Dr. Claiborne Billings - and testosterone per patient request) Lab slips mailed to patient - address verified.

## 2014-06-27 NOTE — Telephone Encounter (Signed)
agree

## 2014-06-27 NOTE — Telephone Encounter (Signed)
Please call,he wants to update his last conversation with you about his Vytorin please.

## 2014-07-16 ENCOUNTER — Other Ambulatory Visit: Payer: Self-pay | Admitting: Cardiovascular Disease

## 2014-08-15 ENCOUNTER — Encounter: Payer: Self-pay | Admitting: Family Medicine

## 2014-08-21 ENCOUNTER — Telehealth: Payer: Self-pay | Admitting: Family Medicine

## 2014-08-21 DIAGNOSIS — I2510 Atherosclerotic heart disease of native coronary artery without angina pectoris: Secondary | ICD-10-CM

## 2014-08-21 DIAGNOSIS — E785 Hyperlipidemia, unspecified: Secondary | ICD-10-CM

## 2014-08-21 DIAGNOSIS — I10 Essential (primary) hypertension: Secondary | ICD-10-CM

## 2014-08-21 NOTE — Telephone Encounter (Signed)
I placed the orders. Have him come in for blood work when he can arrange it.

## 2014-08-21 NOTE — Telephone Encounter (Signed)
Pt called back regarding labs and stated that he does not et his lab work done here. He needs lab order written so he can pick them up and take it to [facility] where he get his labs done at a discount. Call pt when order is ready for pick up

## 2014-08-21 NOTE — Telephone Encounter (Signed)
Left message for pt to pick up RX for labs

## 2014-08-21 NOTE — Telephone Encounter (Signed)
Take care of this 

## 2014-08-21 NOTE — Telephone Encounter (Signed)
Left message word for word message on pt #

## 2014-08-21 NOTE — Telephone Encounter (Signed)
This pt sent you a email concerning labs he wants to have drawn. You responded by telling him he needed an appt. Pt has now made one for a CPE in Sept. He stated if you need any other labs drawn other that the ones he mentioned in email then please let him know and write up orders. He must have those drawn this week due to the fact that he is going out of town for a month.

## 2014-08-26 ENCOUNTER — Encounter: Payer: Self-pay | Admitting: Cardiovascular Disease

## 2014-08-26 ENCOUNTER — Encounter: Payer: Self-pay | Admitting: Family Medicine

## 2014-08-26 LAB — COMPREHENSIVE METABOLIC PANEL
ALK PHOS: 83 IU/L (ref 39–117)
ALT: 30 IU/L (ref 0–44)
AST: 19 IU/L (ref 0–40)
Albumin/Globulin Ratio: 2.2 (ref 1.1–2.5)
Albumin: 4.6 g/dL (ref 3.6–4.8)
BUN/Creatinine Ratio: 12 (ref 10–22)
BUN: 13 mg/dL (ref 8–27)
Bilirubin Total: 0.5 mg/dL (ref 0.0–1.2)
CO2: 21 mmol/L (ref 18–29)
Calcium: 9.3 mg/dL (ref 8.6–10.2)
Chloride: 101 mmol/L (ref 97–108)
Creatinine, Ser: 1.05 mg/dL (ref 0.76–1.27)
GFR calc Af Amer: 89 mL/min/{1.73_m2} (ref >59)
GFR calc non Af Amer: 77 mL/min/{1.73_m2} (ref >59)
GLUCOSE: 100 mg/dL — AB (ref 65–99)
Globulin, Total: 2.1 g/dL (ref 1.5–4.5)
Potassium: 4.5 mmol/L (ref 3.5–5.2)
Sodium: 141 mmol/L (ref 134–144)
Total Protein: 6.7 g/dL (ref 6.0–8.5)

## 2014-08-26 LAB — CBC
Hematocrit: 45.1 % (ref 37.5–51.0)
Hemoglobin: 15.8 g/dL (ref 12.6–17.7)
MCH: 31.4 pg (ref 26.6–33.0)
MCHC: 35 g/dL (ref 31.5–35.7)
MCV: 90 fL (ref 79–97)
Platelets: 172 10*3/uL (ref 150–379)
RBC: 5.03 x10E6/uL (ref 4.14–5.80)
RDW: 14.3 % (ref 12.3–15.4)
WBC: 6.8 10*3/uL (ref 3.4–10.8)

## 2014-08-26 LAB — TESTOSTERONE: Testosterone: 325 ng/dL — ABNORMAL LOW (ref 348–1197)

## 2014-08-26 LAB — LIPID PANEL
CHOLESTEROL TOTAL: 138 mg/dL (ref 100–199)
Chol/HDL Ratio: 2.8 ratio units (ref 0.0–5.0)
HDL: 49 mg/dL (ref >39)
LDL Calculated: 63 mg/dL (ref 0–99)
Triglycerides: 131 mg/dL (ref 0–149)
VLDL CHOLESTEROL CAL: 26 mg/dL (ref 5–40)

## 2014-08-26 LAB — TSH: TSH: 2.7 u[IU]/mL (ref 0.450–4.500)

## 2014-09-13 ENCOUNTER — Ambulatory Visit (INDEPENDENT_AMBULATORY_CARE_PROVIDER_SITE_OTHER): Payer: 59 | Admitting: Family Medicine

## 2014-09-13 ENCOUNTER — Encounter: Payer: Self-pay | Admitting: Family Medicine

## 2014-09-13 VITALS — BP 116/70 | HR 68 | Ht 74.5 in | Wt 248.0 lb

## 2014-09-13 DIAGNOSIS — E785 Hyperlipidemia, unspecified: Secondary | ICD-10-CM

## 2014-09-13 DIAGNOSIS — Z23 Encounter for immunization: Secondary | ICD-10-CM

## 2014-09-13 DIAGNOSIS — I252 Old myocardial infarction: Secondary | ICD-10-CM | POA: Diagnosis not present

## 2014-09-13 DIAGNOSIS — Z0000 Encounter for general adult medical examination without abnormal findings: Secondary | ICD-10-CM

## 2014-09-13 DIAGNOSIS — N400 Enlarged prostate without lower urinary tract symptoms: Secondary | ICD-10-CM | POA: Diagnosis not present

## 2014-09-13 DIAGNOSIS — I2510 Atherosclerotic heart disease of native coronary artery without angina pectoris: Secondary | ICD-10-CM | POA: Diagnosis not present

## 2014-09-13 DIAGNOSIS — I10 Essential (primary) hypertension: Secondary | ICD-10-CM | POA: Diagnosis not present

## 2014-09-13 MED ORDER — FINASTERIDE 5 MG PO TABS: 5.0000 mg | ORAL_TABLET | Freq: Every day | ORAL | Status: DC

## 2014-09-13 NOTE — Progress Notes (Signed)
Subjective:    Patient ID: Brandon Frederick, male    DOB: 1955/07/27, 59 y.o.   MRN: [id number]  HPI He is here for complete examination. Does have underlying heart disease. He did have an MI in 2004 and again in 2009.  He has been seen recently by cardiology and did have his medications renewed. He does complain of nocturia as well as difficulty with hesitancy, decreased stream and feeling of incomplete emptying. He did have a colonoscopy in 2007. His immunizations were reviewed. Family and social history was also reviewed. He is now retired and very much enjoying his retirement.He has had no chest pain, shortness of breath, nausea, vomiting, abdominal pain. He does have concerns over his weight however keeps himself quite physically active and states that he is eating quite healthy.  Review of Systems  All other systems reviewed and are negative.      Objective:   Physical Exam BP 116/70 mmHg  Pulse 68  Ht 6' 2.5" (1.892 m)  Wt 248 lb (112.492 kg)  BMI 31.43 kg/m2  SpO2 97%  General Appearance:    Alert, cooperative, no distress, appears stated age  Head:    Normocephalic, without obvious abnormality, atraumatic  Eyes:    PERRL, conjunctiva/corneas clear, EOM's intact, fundi    benign  Ears:    Normal TM's and external ear canals  Nose:   Nares normal, mucosa normal, no drainage or sinus   tenderness  Throat:   Lips, mucosa, and tongue normal; teeth and gums normal  Neck:   Supple, no lymphadenopathy;  thyroid:  no   enlargement/tenderness/nodules; no carotid   bruit or JVD  Back:    Spine nontender, no curvature, ROM normal, no CVA     tenderness  Lungs:     Clear to auscultation bilaterally without wheezes, rales or     ronchi; respirations unlabored  Chest Wall:    No tenderness or deformity   Heart:    Regular rate and rhythm, S1 and S2 normal, no murmur, rub   or gallop  Breast Exam:    No chest wall tenderness, masses or gynecomastia  Abdomen:     Soft, non-tender,  nondistended, normoactive bowel sounds,    no masses, no hepatosplenomegaly        Extremities:   No clubbing, cyanosis or edema  Pulses:   2+ and symmetric all extremities  Skin:   Skin color, texture, turgor normal, no rashes or lesions  Lymph nodes:   Cervical, supraclavicular, and axillary nodes normal  Neurologic:   CNII-XII intact, normal strength, sensation and gait; reflexes 2+ and symmetric throughout          Psych:   Normal mood, affect, hygiene and grooming.          Assessment & Plan:  Need for prophylactic vaccination and inoculation against influenza - Plan: Flu Vaccine QUAD 36+ mos IM  Need for shingles vaccine - Plan: Varicella-zoster vaccine subcutaneous  BPH (benign prostatic hyperplasia) - Plan: finasteride (PROSCAR) 5 MG tablet  ASHD (arteriosclerotic heart disease)  Hyperlipidemia with target LDL less than 70  Old MI (myocardial infarction)  Essential hypertension Discussed diet and exercise with him. Recommend increasing his physical activity by at least 15 minutes daily although he does keep himself quite active. Also discussed dietary modification and possible referral to nutritionist. His systems are consistent with BPH and I will place him on finasteride. Explained that it will take several months ago for effect. I will also  do a PSA which we done through a different laboratory.

## 2014-09-14 ENCOUNTER — Encounter: Payer: Self-pay | Admitting: Family Medicine

## 2014-09-18 ENCOUNTER — Encounter: Payer: Self-pay | Admitting: Family Medicine

## 2014-09-26 ENCOUNTER — Ambulatory Visit (INDEPENDENT_AMBULATORY_CARE_PROVIDER_SITE_OTHER): Payer: 59 | Admitting: Cardiovascular Disease

## 2014-09-26 ENCOUNTER — Encounter: Payer: Self-pay | Admitting: Cardiovascular Disease

## 2014-09-26 ENCOUNTER — Telehealth: Payer: Self-pay | Admitting: Family Medicine

## 2014-09-26 VITALS — BP 142/84 | HR 54 | Ht 75.0 in | Wt 249.9 lb

## 2014-09-26 DIAGNOSIS — I252 Old myocardial infarction: Secondary | ICD-10-CM

## 2014-09-26 DIAGNOSIS — E669 Obesity, unspecified: Secondary | ICD-10-CM | POA: Insufficient documentation

## 2014-09-26 DIAGNOSIS — I10 Essential (primary) hypertension: Secondary | ICD-10-CM

## 2014-09-26 DIAGNOSIS — I25810 Atherosclerosis of coronary artery bypass graft(s) without angina pectoris: Secondary | ICD-10-CM

## 2014-09-26 DIAGNOSIS — E785 Hyperlipidemia, unspecified: Secondary | ICD-10-CM | POA: Diagnosis not present

## 2014-09-26 NOTE — Progress Notes (Signed)
Patient ID: [name], male   DOB: [birth year], 59 y.o.   MRN: [id number]      HPI: LEGRANDE HAO is a 59 y.o. male who presents to the office for one year cardiology evaluation.  Mr. Hnat  suffered a large anterior wall myocardial infarction in June 2004 due to total occlusion of his proximal LAD. He underwent successful stenting of his proximal LAD with a 3.5x20 mm Cypher stent postdilated to 4.71m.  In June 2009 he was found to have progressive disease in the mid LAD and an additional 2.75x18 mm Cypher stent was placed, postdilated to 3.35 to 3.25 taper. In addition, a 2.75x13 mm stent was placed in the PLA branch of his RCA postdilated 3.25 mm. Over the last several years he has remained stable without recurrent chest pain. His last laboratory in March 2014 showed excellent cholesterol at 133 triglycerides 132 HDL 54 LDL 53. An echo Doppler study in 2011 showed an ejection fraction of 40% with mild aortic valve sclerosis with anterior apical moderate to severe hypokinesis. His last nuclear perfusion study was done September 2013 which showed old the distal apical anterior scar without ischemia.   Over the past year, he has felt fairly well. He remains active. He works on cars.  He denies any episodes of chest pain or palpitations.  He denies dizziness.  He denies presyncope or syncope.  He has been on ramipril 2.5 mg twice a day with low-dose Bystolic 2.5 mg.  He continues to be on dual antiplatelets therapy with aspirin and Plavix.  He is on lipid lowering therapy with Lipitor 40 mg.  He also is on Proscar.  He presents for one-year evaluation.  Past Medical History  Diagnosis Date  . ASHD (arteriosclerotic heart disease)     stent  . Dyslipidemia   . Hyperlipidemia   . Melanoma   . MI (myocardial infarction)     Large Anterior wall, at which he underwent stenting of his promixal occluded LAD with ultimate insertion of a 3.5 x 28 mm Cypher stent post dilated to 4.1 cm.  . History of  stress test 09/2011    Which showed his previously documented scarring anteriorly and apically, concordant with his LAD infarction. Post-stress EF 40%  . Hx of echocardiogram     showed an EF of approximately 40% with moderate-to-severe anterior wall hypokenisis and moderate-to-severe apical wall hypokinesis. He did have mild aortic sclerosis. There was mild pulmonary hypertensin with estimated RV systolic pressure of 34 mm.    Past Surgical History  Procedure Laterality Date  . Cardiac catheterization  06/2007     revealed a progressive disease in the mid LAD, and he underwent insertion of a new mid-LAD stents as well stenting of his PLA branch of his right coronary artery with a 2.75 x 30 mm Cypher stent post dilated 3.25 mm. He did also have some mild additional concomitant CAD.    No Known Allergies  Current Outpatient Prescriptions  Medication Sig Dispense Refill  . aspirin 81 MG tablet Take 81 mg by mouth daily.      .Marland Kitchenatorvastatin (LIPITOR) 40 MG tablet Take 1 tablet (40 mg total) by mouth daily. 90 tablet 1  . clopidogrel (PLAVIX) 75 MG tablet TAKE ONE TABLET BY MOUTH ONCE DAILY 90 tablet 0  . finasteride (PROSCAR) 5 MG tablet Take 1 tablet (5 mg total) by mouth daily. 30 tablet 11  . Multiple Vitamins-Minerals (MULTIVITAMIN WITH MINERALS) tablet Take 1 tablet by mouth daily.    .Marland Kitchen  nebivolol (BYSTOLIC) 2.5 MG tablet Take 1 tablet (2.5 mg total) by mouth daily. 30 tablet 5  . nitroGLYCERIN (NITROSTAT) 0.4 MG SL tablet Place 1 tablet (0.4 mg total) under the tongue every 5 (five) minutes as needed for chest pain. 30 tablet 3  . ramipril (ALTACE) 2.5 MG capsule TAKE ONE CAPSULE BY MOUTH TWICE DAILY 180 capsule 1   No current facility-administered medications for this visit.    Social History   Social History  . Marital Status: Married    Spouse Name: N/A  . Number of Children: N/A  . Years of Education: N/A   Occupational History  . Not on file.   Social History Main Topics   . Smoking status: Never Smoker   . Smokeless tobacco: Not on file  . Alcohol Use: Yes     Comment: rare  . Drug Use: No  . Sexual Activity: Yes   Other Topics Concern  . Not on file   Social History Narrative   Socially he is married with no children. There is no tobacco use. He does drink occasional alcohol. He recently bought a home  in [place] has been spending time at the [place] history is notable that both parents are alive, mother age 37 father age 50. He is a brother age 51 and a sister age 39. Maternal grandmother suffered an MI and died at 85  ROS General: Negative; No fevers, chills, or night sweats;  HEENT: Negative; No changes in vision or hearing, sinus congestion, difficulty swallowing Pulmonary: Negative; No cough, wheezing, shortness of breath, hemoptysis Cardiovascular: Negative; No chest pain, presyncope, syncope, palpitations GI: Negative; No nausea, vomiting, diarrhea, or abdominal pain GU: Negative; No dysuria, hematuria, or difficulty voiding Musculoskeletal: Negative; no myalgias, joint pain, or weakness Hematologic/Oncology: Negative; no easy bruising, bleeding Endocrine: Negative; no heat/cold intolerance; no diabetes Neuro: Transient right lateral thigh paresthesias; no changes in balance, headaches Skin: Negative; No rashes or skin lesions Psychiatric: Negative; No behavioral problems, depression Sleep: Negative; No snoring, daytime sleepiness, hypersomnolence, bruxism, restless legs, hypnogognic hallucinations, no cataplexy Other comprehensive 14 point system review is negative.   PE BP 142/84 mmHg  Pulse 54  Ht '6\' 3"'  (1.905 m)  Wt 249 lb 14.4 oz (113.354 kg)  BMI 31.24 kg/m2  Repeat blood pressure by me was 120/72 Wt Readings from Last 3 Encounters:  09/26/14 249 lb 14.4 oz (113.354 kg)  09/13/14 248 lb (112.492 kg)  11/23/13 239 lb (108.41 kg)  . General: Alert, oriented, no distress.  Skin: normal turgor, no rashes HEENT:  Normocephalic, atraumatic. Pupils round and reactive; sclera anicteric;no lid lag.  Nose without nasal septal hypertrophy Mouth/Parynx benign; Mallinpatti scale 2/3 Neck: No JVD, no carotid bruits with normal carotid upstroke Lungs: clear to ausculatation and percussion; no wheezing or rales Chest wall: No tenderness to palpation Heart: RRR, s1 s2 normal 1/6 systolic murmur compatible with his documented mild aortic valve sclerosis; no diastolic murmur. No rubs thrills or heaves. Abdomen: Mild central adiposity; soft, nontender; no hepatosplenomehaly, BS+; abdominal aorta nontender and not dilated by palpation. Back: No CVA tenderness Pulses 2+ Extremities: no clubbing cyanosis or edema, Homan's sign negative  Neurologic: grossly nonfocal; cranial nerves grossly normal Psychological: Normal affect and mood.  ECG (independently read by me): , sinus bradycardia 54 bpm.  Anterior Q waves V1 through V4 concordant with his old anterior MI.  Normal intervals.    August 2015 ECG (independently read by me): Sinus rhythm at 64 beats per minute.  Old anterior wall myocardial infarction.  QTc interval 429 ms. ,  Nonspecific T. change  02/24/2013 ECG (independently read by me): Sinus rhythm at 60 beats per minute. Anterior Q waves V1 through V4 compatible with his prior anterior wall myocardial infarction. QTc interval 470.  Prior ECG of 08/23/2012: Sinus rhythm at 66 beats per minute. Perineural 184 ms. Poor anterograde progression compatible with his old injury or myocardial infarction.  LABS: BMP Latest Ref Rng 08/25/2014 03/15/2013 06/09/2007  Glucose 65 - 99 mg/dL 100(H) 100(H) 105(H)  BUN 8 - 27 mg/dL '13 15 10  ' Creatinine 0.76 - 1.27 mg/dL 1.05 1.05 1.27  BUN/Creat Ratio 10 - '22 12 14 ' -  Sodium 134 - 144 mmol/L 141 140 139  Potassium 3.5 - 5.2 mmol/L 4.5 4.5 3.8  Chloride 97 - 108 mmol/L 101 101 108  CO2 18 - 29 mmol/L '21 23 25  ' Calcium 8.6 - 10.2 mg/dL 9.3 9.2 8.9   Hepatic Function Latest  Ref Rng 08/25/2014 03/15/2013  Total Protein 6.0 - 8.5 g/dL 6.7 6.2  AST 0 - 40 IU/L 19 27  ALT 0 - 44 IU/L 30 45(H)  Alk Phosphatase 39 - 117 IU/L 83 72  Total Bilirubin 0.0 - 1.2 mg/dL 0.5 0.7   CBC Latest Ref Rng 08/25/2014 03/15/2013 06/09/2007  WBC 3.4 - 10.8 x10E3/uL 6.8 7.3 7.8  Hemoglobin 12.6 - 17.7 g/dL - 15.3 13.6  Hematocrit 37.5 - 51.0 % 45.1 44.2 39.7  Platelets 150 - 379 x10E3/uL - 167 147(L)   Lab Results  Component Value Date   MCV 90 03/15/2013   MCV 92.3 06/09/2007   MCV 93.3 06/08/2007   Lab Results  Component Value Date   TSH 2.700 08/25/2014  No results found for: HGBA1C  Lipid Panel     Component Value Date/Time   CHOL 138 08/25/2014 0946   TRIG 131 08/25/2014 0946   HDL 49 08/25/2014 0946   CHOLHDL 2.8 08/25/2014 0946   LDLCALC 63 08/25/2014 0946   RADIOLOGY: No results found.    ASSESSMENT AND PLAN: Mr. Hoagland is  a 59 year old white male who is 12 years s/p suffering a large anterior wall myocardial infarction in June 2004 at which time he  underwent stenting of his proximal occluded LAD. In June 2009 a new LAD stent was inserted beyond the initially placed stent and he also underwent stenting of his PLA branch. Over the past 7 years, he has remained fairly stable without recurrent anginal symptoms. He is maintaining dual antiplatelet therapy with aspirin 81 mg and Plavix 75 mg and denies bleeding.  Due to cost, he is no longer on Vytorin but has been taking atorvastatin 40 mg.  Most recent lipid panel continues to be stable with a total cluster 138, triglycerides 131, HDL 49 and LDL 63.  His blood pressure today continues to be stable and he is tolerating low-dose ramipril and Bystolic.  I reviewed his additional lab work.  We discussed weight loss and increased exercise.  He has gained 10 pounds over the past year.  Body mass index is 31.24, consistent with mild obesity.  He will continue his current medical regimen.  I will see him in one year for  reevaluation or sooner if problem arise.  Time spent: 25 minutes  Troy Sine, MD, [facility]  09/26/2014 6:19 PM

## 2014-09-26 NOTE — Patient Instructions (Signed)
Your physician wants you to follow-up in: 1 year or sooner as needed. You will receive a reminder letter in the mail two months in advance. If you don't receive a letter, please call our office to schedule the follow-up appointment.

## 2014-09-26 NOTE — Telephone Encounter (Signed)
Brandon Frederick with [facility] care called this morning.  Patient has walked in with metal in his eye needing emergency treatment.  Obtained referral from [facility] online Ref [id number].  Called B[facility] back [phone] and gave number and faxed copy of form to [phone]. Patient was seen by Dr. Sharol Roussel dx [phone] and [phone]

## 2014-10-05 ENCOUNTER — Other Ambulatory Visit: Payer: Self-pay | Admitting: Cardiovascular Disease

## 2014-10-05 NOTE — Telephone Encounter (Signed)
Rx(s) sent to pharmacy electronically.  

## 2014-10-06 ENCOUNTER — Encounter: Payer: Self-pay | Admitting: Family Medicine

## 2014-10-15 ENCOUNTER — Encounter: Payer: Self-pay | Admitting: Cardiovascular Disease

## 2014-10-15 ENCOUNTER — Other Ambulatory Visit: Payer: Self-pay | Admitting: Cardiovascular Disease

## 2014-10-16 NOTE — Telephone Encounter (Signed)
REFILL 

## 2014-10-25 ENCOUNTER — Other Ambulatory Visit: Payer: Self-pay | Admitting: Cardiovascular Disease

## 2014-11-19 ENCOUNTER — Encounter: Payer: Self-pay | Admitting: Family Medicine

## 2014-11-21 ENCOUNTER — Encounter: Payer: Self-pay | Admitting: Cardiovascular Disease

## 2014-11-27 ENCOUNTER — Telehealth: Payer: Self-pay | Admitting: Cardiovascular Disease

## 2014-11-27 NOTE — Telephone Encounter (Signed)
Pt wants to know if there is something else he can take in the place of Bystolic. The cost of it is going to increase in the new year.

## 2014-11-27 NOTE — Telephone Encounter (Signed)
Returned call to patient.He stated he wanted Dr.Kelly to change Bystolic too expensive.Message sent to [facility].

## 2014-11-27 NOTE — Telephone Encounter (Signed)
Pt wants to know if there is something else he can take in the place of Bystolic. The cost of it is suppose to increase in the new year.

## 2014-12-01 NOTE — Telephone Encounter (Signed)
Can change to metoprolol succinate 25 mg, but if P < 55 then take 12.5 mg daily.

## 2014-12-04 ENCOUNTER — Telehealth: Payer: Self-pay | Admitting: Cardiovascular Disease

## 2014-12-04 NOTE — Telephone Encounter (Signed)
Returned call to patient he stated he is checking into a new insurance plan.Stated he will call back when he is ready to start Metoprolol Succ 25 mg daily if pulse < 55 then he is to take 12.5 mg daily.

## 2014-12-04 NOTE — Telephone Encounter (Signed)
Nurse called before I could take the message

## 2014-12-04 NOTE — Telephone Encounter (Signed)
Pt says he was waiting to hear what Dr Claiborne Billings said about switching from [facility]

## 2014-12-05 ENCOUNTER — Telehealth: Payer: Self-pay

## 2014-12-05 NOTE — Telephone Encounter (Signed)
Pt called and said after his insurance changes 01/07/15 he would like to start using the [facility] mail order (the one in the system isn't the same as what he is using). He is wanting to get a year supply at a time of his Finasteride 5mg  through this service starting 01/07/15. He wanted to see if this was ok?  [place] [address]  Fax: [phone]

## 2014-12-05 NOTE — Telephone Encounter (Signed)
Take care of this 

## 2014-12-06 ENCOUNTER — Other Ambulatory Visit: Payer: Self-pay

## 2014-12-06 ENCOUNTER — Encounter: Payer: Self-pay | Admitting: Family Medicine

## 2014-12-06 DIAGNOSIS — N400 Enlarged prostate without lower urinary tract symptoms: Secondary | ICD-10-CM

## 2014-12-07 ENCOUNTER — Encounter: Payer: Self-pay | Admitting: Cardiovascular Disease

## 2014-12-08 ENCOUNTER — Telehealth: Payer: Self-pay | Admitting: Cardiovascular Disease

## 2014-12-08 MED ORDER — METOPROLOL SUCCINATE ER 25 MG PO TB24: 25.0000 mg | ORAL_TABLET | Freq: Every day | ORAL | Status: DC

## 2014-12-08 NOTE — Telephone Encounter (Signed)
Please call,concerning his new medicine.(Metoprolol)

## 2014-12-08 NOTE — Telephone Encounter (Signed)
Please call,concerning his new medicine(Metoprolol)

## 2014-12-08 NOTE — Telephone Encounter (Signed)
Unsure why this is closed. A duplicate encounter was opened and documented.

## 2014-12-08 NOTE — Telephone Encounter (Signed)
Pt is ready to start on metoprolol succ 25mg  daily.  We discussed potential SE's, as always, I outlined risk of allergic reaction or other problems w/ starting a new medication. Pt wants to do years supply vs 90 day supply due to noticeable difference in cost, but after our discussion he and I decided best route would be to do 30 days supply 1st, pick up at local pharmacy. If he takes w/ no problems,  Write for 1 year w online pharmacy in [place].  Reviewed previous telephone notes. Verified Dr. Evette Georges OK for him to start this to replace bystolic.  Pt will start for 30 days - picking up from local pharmacy. Patient will call to have years supply Rx sent to online pharmacy if no problems after starting med.

## 2014-12-08 NOTE — Telephone Encounter (Signed)
Pt said he missed return call.

## 2014-12-21 ENCOUNTER — Other Ambulatory Visit: Payer: Self-pay | Admitting: *Deleted

## 2014-12-21 ENCOUNTER — Encounter: Payer: Self-pay | Admitting: Cardiovascular Disease

## 2014-12-21 ENCOUNTER — Other Ambulatory Visit: Payer: Self-pay | Admitting: Family Medicine

## 2014-12-21 ENCOUNTER — Encounter: Payer: Self-pay | Admitting: Family Medicine

## 2014-12-21 DIAGNOSIS — N400 Enlarged prostate without lower urinary tract symptoms: Secondary | ICD-10-CM

## 2014-12-21 MED ORDER — METOPROLOL SUCCINATE ER 25 MG PO TB24: 25.0000 mg | ORAL_TABLET | Freq: Every day | ORAL | Status: DC

## 2014-12-21 MED ORDER — ATORVASTATIN CALCIUM 40 MG PO TABS: 40.0000 mg | ORAL_TABLET | Freq: Every day | ORAL | Status: DC

## 2014-12-21 MED ORDER — FINASTERIDE 5 MG PO TABS: 5.0000 mg | ORAL_TABLET | Freq: Every day | ORAL | Status: DC

## 2014-12-21 MED ORDER — RAMIPRIL 2.5 MG PO CAPS
2.5000 mg | ORAL_CAPSULE | Freq: Two times a day (BID) | ORAL | Status: DC
Start: 2014-12-21 — End: 2015-09-17

## 2014-12-21 MED ORDER — CLOPIDOGREL BISULFATE 75 MG PO TABS: 75.0000 mg | ORAL_TABLET | Freq: Every day | ORAL | Status: DC

## 2014-12-21 NOTE — Telephone Encounter (Signed)
This has been done.

## 2015-05-14 ENCOUNTER — Encounter: Payer: Self-pay | Admitting: Gastroenterology

## 2015-06-12 ENCOUNTER — Encounter: Payer: Self-pay | Admitting: Gastroenterology

## 2015-07-18 ENCOUNTER — Ambulatory Visit (INDEPENDENT_AMBULATORY_CARE_PROVIDER_SITE_OTHER): Payer: BLUE CROSS/BLUE SHIELD | Admitting: Gastroenterology

## 2015-07-18 ENCOUNTER — Encounter: Payer: Self-pay | Admitting: Gastroenterology

## 2015-07-18 ENCOUNTER — Telehealth: Payer: Self-pay

## 2015-07-18 VITALS — BP 122/78 | HR 62 | Ht 75.0 in | Wt 257.0 lb

## 2015-07-18 DIAGNOSIS — Z1212 Encounter for screening for malignant neoplasm of rectum: Secondary | ICD-10-CM

## 2015-07-18 DIAGNOSIS — K5900 Constipation, unspecified: Secondary | ICD-10-CM

## 2015-07-18 DIAGNOSIS — Z1211 Encounter for screening for malignant neoplasm of colon: Secondary | ICD-10-CM | POA: Diagnosis not present

## 2015-07-18 DIAGNOSIS — K921 Melena: Secondary | ICD-10-CM

## 2015-07-18 MED ORDER — NA SULFATE-K SULFATE-MG SULF 17.5-3.13-1.6 GM/177ML PO SOLN: 1.0000 | Freq: Once | ORAL | Status: DC

## 2015-07-18 NOTE — Progress Notes (Signed)
History of Present Illness: This is a 60 year old male self referred for the evaluation of CRC screening, hematochezia and constipation. He relates occasional episodes of hard stools with it occasional small amounts of bright red blood per rectum associated with hard stools the symptoms may happen every several months. He previously underwent colonoscopy in 2007 showing diverticulosis, a hyperplastic colon polyp and internal hemorrhoids. He has no other gastrointestinal complaints. He is maintained on Plavix following coronary artery stent placement. Denies weight loss, abdominal pain, diarrhea, change in stool caliber, melena, nausea, vomiting, dysphagia, reflux symptoms, chest pain.   No Known Allergies Outpatient Prescriptions Prior to Visit  Medication Sig Dispense Refill  . aspirin 81 MG tablet Take 81 mg by mouth daily.      Marland Kitchen atorvastatin (LIPITOR) 40 MG tablet Take 1 tablet (40 mg total) by mouth daily. 90 tablet 2  . clopidogrel (PLAVIX) 75 MG tablet Take 1 tablet (75 mg total) by mouth daily. 90 tablet 2  . finasteride (PROSCAR) 5 MG tablet Take 1 tablet (5 mg total) by mouth daily. 90 tablet 4  . metoprolol succinate (TOPROL-XL) 25 MG 24 hr tablet Take 1 tablet (25 mg total) by mouth daily. 90 tablet 2  . Multiple Vitamins-Minerals (MULTIVITAMIN WITH MINERALS) tablet Take 1 tablet by mouth daily.    . nitroGLYCERIN (NITROSTAT) 0.4 MG SL tablet Place 1 tablet (0.4 mg total) under the tongue every 5 (five) minutes as needed for chest pain. 30 tablet 3  . ramipril (ALTACE) 2.5 MG capsule Take 1 capsule (2.5 mg total) by mouth 2 (two) times daily. 180 capsule 2   No facility-administered medications prior to visit.   Past Medical History  Diagnosis Date  . ASHD (arteriosclerotic heart disease)     stent  . Dyslipidemia   . Hyperlipidemia   . Melanoma ([place])     torso  . MI (myocardial infarction) ([place]) 2004, 2009    Large Anterior wall, at which he underwent stenting of his  promixal occluded LAD with ultimate insertion of a 3.5 x 28 mm Cypher stent post dilated to 4.1 cm.  . History of stress test 09/2011    Which showed his previously documented scarring anteriorly and apically, concordant with his LAD infarction. Post-stress EF 40%  . Hx of echocardiogram     showed an EF of approximately 40% with moderate-to-severe anterior wall hypokenisis and moderate-to-severe apical wall hypokinesis. He did have mild aortic sclerosis. There was mild pulmonary hypertensin with estimated RV systolic pressure of 34 mm.   Past Surgical History  Procedure Laterality Date  . Cardiac catheterization  06/2007     revealed a progressive disease in the mid LAD, and he underwent insertion of a new mid-LAD stents as well stenting of his PLA branch of his right coronary artery with a 2.75 x 30 mm Cypher stent post dilated 3.25 mm. He did also have some mild additional concomitant CAD.  Marland Kitchen Colonoscopy     Social History   Social History  . Marital Status: Married    Spouse Name: N/A  . Number of Children: 0  . Years of Education: N/A   Occupational History  . retired [employer]    Social History Main Topics  . Smoking status: Never Smoker   . Smokeless tobacco: Never Used  . Alcohol Use: 0.0 oz/week    0 Standard drinks or equivalent per week     Comment: occ.  . Drug Use: No  . Sexual Activity: Yes  Other Topics Concern  . None   Social History Narrative   Family History  Problem Relation Age of Onset  . Colon cancer Maternal Grandfather     dx in his 21's  . Crohn's disease Brother   . Heart disease Father     5 way bypass     Review of Systems: Pertinent positive and negative review of systems were noted in the above HPI section. All other review of systems were otherwise negative.   Physical Exam: General: Well developed, well nourished, no acute distress Head: Normocephalic and atraumatic Eyes:  sclerae anicteric, EOMI Ears: Normal auditory  acuity Mouth: No deformity or lesions Neck: Supple, no masses or thyromegaly Lungs: Clear throughout to auscultation Heart: Regular rate and rhythm; no murmurs, rubs or bruits Abdomen: Soft, non tender and non distended. No masses, hepatosplenomegaly or hernias noted. Normal Bowel sounds Rectal: deferred to colonoscopy Musculoskeletal: Symmetrical with no gross deformities  Skin: No lesions on visible extremities Pulses:  Normal pulses noted Extremities: No clubbing, cyanosis, edema or deformities noted Neurological: Alert oriented x 4, grossly nonfocal Cervical Nodes:  No significant cervical adenopathy Inguinal Nodes: No significant inguinal adenopathy Psychological:  Alert and cooperative. Normal mood and affect  Assessment and Recommendations:  1. CRC screening. Due for 10 year interval screening. Schedule colonoscopy. The risks (including bleeding, perforation, infection, missed lesions, medication reactions and possible hospitalization or surgery if complications occur), benefits, and alternatives to colonoscopy with possible biopsy and possible polypectomy were discussed with the patient and they consent to proceed.   2. Small volume hematochezia likely secondary to known internal hemorrhoids. Preparation H suppositories daily as needed.  3. Mild constipation. Stool softener and/or increase dietary fiber and water intake.  4. CAD with coronary artery stent placed maintained on Plavix. Hold Plavix 5 days before procedure - will instruct when and how to resume after procedure. Low but real risk of cardiovascular event such as heart attack, stroke, embolism, thrombosis or ischemia/infarct of other organs off Plavix explained and need to seek urgent help if this occurs. The patient consents to proceed. Will communicate by phone or EMR with patient's prescribing provider to confirm that holding Plavix is reasonable in this case.

## 2015-07-18 NOTE — Telephone Encounter (Signed)
  07/18/2015   RE: Brandon Frederick DOB: [birth year] MRN: [id number]   Dear Dr. Claiborne Billings,    We have scheduled the above patient for an endoscopic procedure. Our records show that he is on anticoagulation therapy.   Please advise as to how long the patient may come off his therapy of Plavix prior to the procedure, which is scheduled for 08/21/15.  Please route your answer to Marlon Pel, CMA  Sincerely,    Zachery Dakins

## 2015-07-18 NOTE — Patient Instructions (Signed)
You have been scheduled for a colonoscopy. Please follow written instructions given to you at your visit today.  Please pick up your prep supplies at the pharmacy within the next 1-3 days. If you use inhalers (even only as needed), please bring them with you on the day of your procedure. Your physician has requested that you go to www.startemmi.com and enter the access code given to you at your visit today. This web site gives a general overview about your procedure. However, you should still follow specific instructions given to you by our office regarding your preparation for the procedure.  Normal BMI (Body Mass Index- based on height and weight) is between 19 and 25. Your BMI today is Body mass index is 32.12 kg/(m^2). Marland Kitchen Please consider follow up  regarding your BMI with your Primary Care Provider.  Thank you for choosing me and [facility].  Pricilla Riffle. Dagoberto Ligas., MD., [facility]

## 2015-07-19 ENCOUNTER — Telehealth: Payer: Self-pay | Admitting: Gastroenterology

## 2015-07-19 NOTE — Telephone Encounter (Signed)
Informed patient that I will mail a coupon to there home address. Patient verbalized understanding.

## 2015-07-24 ENCOUNTER — Encounter: Payer: Self-pay | Admitting: Gastroenterology

## 2015-08-01 ENCOUNTER — Telehealth: Payer: Self-pay | Admitting: Cardiovascular Disease

## 2015-08-01 NOTE — Telephone Encounter (Signed)
New message     Brandon Frederick [facility] calling checking on the status of in basket message to Dr. Claiborne Billings on patient stopping Plavix before procedure -schedule on  8.2.2017.     patient called an move surgery date up.

## 2015-08-01 NOTE — Telephone Encounter (Signed)
Routed for review.

## 2015-08-02 NOTE — Telephone Encounter (Signed)
Per dr hochrein, pt is clear to hold his plavix 5 days prior to procedure. Will forward to [place]

## 2015-08-02 NOTE — Telephone Encounter (Signed)
Follow up   Pt verbalized that he don't wants to wait for the rn to return his call, pt states that he has been waiting for a reply on if he can have his medication held for his surgery.

## 2015-08-02 NOTE — Telephone Encounter (Addendum)
Spoke with pt, he is waiting for clearance to hold plavix for colonoscopy. If they do not hear anything today they are going to cancel the procedure.  Will discuss with dr hochrein DOD to see if he can clear since dr Claiborne Billings is not in the office.

## 2015-08-02 NOTE — Telephone Encounter (Signed)
Patient notified Dr. Rosezella [place] recommendations to hold Plavix 5 days prior this procedure and patient verbalized understanding.

## 2015-08-02 NOTE — Telephone Encounter (Signed)
See phone note from Cardiology on 08/01/15.

## 2015-08-02 NOTE — Telephone Encounter (Signed)
Patient called and states he has not heard back about his blood thinner. I informed him that I have not heard back from Dr. Evette Georges office and I sent Dr. Claiborne Billings the letter in his in basket on 07/18/15.  Also told patient I called yesterday and they sent another urgent message to Dr. Claiborne Billings. Informed patient that we may have to reschedule the procedure if we do not hear back from that office. Patient states he will call there office as well.

## 2015-08-07 DIAGNOSIS — H17823 Peripheral opacity of cornea, bilateral: Secondary | ICD-10-CM | POA: Diagnosis not present

## 2015-08-08 ENCOUNTER — Encounter: Payer: Self-pay | Admitting: Gastroenterology

## 2015-08-08 ENCOUNTER — Ambulatory Visit (AMBULATORY_SURGERY_CENTER): Payer: BLUE CROSS/BLUE SHIELD | Admitting: Gastroenterology

## 2015-08-08 VITALS — BP 109/68 | HR 52 | Temp 98.4°F | Resp 17 | Ht 75.0 in | Wt 257.0 lb

## 2015-08-08 DIAGNOSIS — Z1211 Encounter for screening for malignant neoplasm of colon: Secondary | ICD-10-CM

## 2015-08-08 MED ORDER — SODIUM CHLORIDE 0.9 % IV SOLN: 500.0000 mL | INTRAVENOUS | Status: DC

## 2015-08-08 NOTE — Progress Notes (Signed)
To pacu vss patent aw reprot to rn 

## 2015-08-08 NOTE — Telephone Encounter (Signed)
Ok to hold plavix for 5 days. Can still take asa 63 unless gi wants this to be held as well.

## 2015-08-08 NOTE — Op Note (Signed)
[facility] Patient Name: Brandon Frederick Procedure Date: 08/08/2015 3:59 PM MRN: [id number] Endoscopist: Ladene Artist , MD Age: 60 Referring MD:  Date of Birth: 1955-06-14 Gender: Male Account #: [id number] Procedure:                Colonoscopy Indications:              Screening for colorectal malignant neoplasm Medicines:                Monitored Anesthesia Care Procedure:                Pre-Anesthesia Assessment:                           - Prior to the procedure, a History and Physical                            was performed, and patient medications and                            allergies were reviewed. The patient's tolerance of                            previous anesthesia was also reviewed. The risks                            and benefits of the procedure and the sedation                            options and risks were discussed with the patient.                            All questions were answered, and informed consent                            was obtained. Prior Anticoagulants: The patient has                            taken Plavix (clopidogrel), last dose was 5 days                            prior to procedure. ASA Grade Assessment: III - A                            patient with severe systemic disease. After                            reviewing the risks and benefits, the patient was                            deemed in satisfactory condition to undergo the                            procedure.  After obtaining informed consent, the colonoscope                            was passed under direct vision. Throughout the                            procedure, the patient's blood pressure, pulse, and                            oxygen saturations were monitored continuously. The                            Model PCF-H190L [id number]) scope was introduced                            through the anus and advanced to the the cecum,                          identified by appendiceal orifice and ileocecal                            valve. The ileocecal valve, appendiceal orifice,                            and rectum were photographed. The quality of the                            bowel preparation was good. The colonoscopy was                            performed without difficulty. The patient tolerated                            the procedure well. Scope In: 4:16:55 PM Scope Out: 4:29:02 PM Scope Withdrawal Time: 0 hours 10 minutes 53 seconds  Total Procedure Duration: 0 hours 12 minutes 7 seconds  Findings:                 Internal hemorrhoids were found during                            retroflexion. The hemorrhoids were small and Grade                            I (internal hemorrhoids that do not prolapse).                           The exam was otherwise without abnormality on                            direct and retroflexion views.                           A few small-mouthed diverticula were found in the  sigmoid colon. There was no evidence of                            diverticular bleeding. Complications:            No immediate complications. Estimated blood loss:                            None. Estimated Blood Loss:     Estimated blood loss: none. Impression:               - Internal hemorrhoids.                           - Mild diverticulosis in the sigmoid colon.                           - The exam was otherwise normal on direct and                            retroflexed views.                           - No specimens collected. Recommendation:           - Repeat colonoscopy in 10 years for screening                            purposes.                           - Resume Plavix (clopidogrel) tomorrow at prior                            dose. Refer to managing physician for further                            adjustment of therapy.                           - Patient has a  contact number available for                            emergencies. The signs and symptoms of potential                            delayed complications were discussed with the                            patient. Return to normal activities tomorrow.                            Written discharge instructions were provided to the                            patient.                           -  Continue present medications.                           - High fiber diet indefinitely. Ladene Artist, MD 08/08/2015 4:32:59 PM This report has been signed electronically.

## 2015-08-08 NOTE — Patient Instructions (Signed)

## 2015-08-09 ENCOUNTER — Telehealth: Payer: Self-pay

## 2015-08-09 NOTE — Telephone Encounter (Signed)
  Follow up Call-  Call back number 08/08/2015  Post procedure Call Back phone  # [phone]  Permission to leave phone message Yes  Some recent data might be hidden     Patient questions:  Do you have a fever, pain , or abdominal swelling? No. Pain Score  0 *  Have you tolerated food without any problems? Yes.    Have you been able to return to your normal activities? Yes.    Do you have any questions about your discharge instructions: Diet   No. Medications  No. Follow up visit  No.  Do you have questions or concerns about your Care? No.  Actions: * If pain score is 4 or above: No action needed, pain <4.

## 2015-08-21 ENCOUNTER — Encounter: Payer: BLUE CROSS/BLUE SHIELD | Admitting: Gastroenterology

## 2015-09-17 ENCOUNTER — Encounter: Payer: Self-pay | Admitting: Cardiovascular Disease

## 2015-09-17 ENCOUNTER — Other Ambulatory Visit: Payer: Self-pay | Admitting: *Deleted

## 2015-09-17 MED ORDER — RAMIPRIL 2.5 MG PO CAPS: 2.5000 mg | ORAL_CAPSULE | Freq: Two times a day (BID) | ORAL | 0 refills | Status: DC

## 2015-09-17 MED ORDER — ATORVASTATIN CALCIUM 40 MG PO TABS: 40.0000 mg | ORAL_TABLET | Freq: Every day | ORAL | 0 refills | Status: DC

## 2015-09-17 MED ORDER — CLOPIDOGREL BISULFATE 75 MG PO TABS: 75.0000 mg | ORAL_TABLET | Freq: Every day | ORAL | 0 refills | Status: DC

## 2015-09-17 MED ORDER — METOPROLOL SUCCINATE ER 25 MG PO TB24: 25.0000 mg | ORAL_TABLET | Freq: Every day | ORAL | 0 refills | Status: DC

## 2015-09-17 NOTE — Telephone Encounter (Signed)
Patient sent mychart msg request for refills. Submitted these to his preferred pharmacy, scheduled for yearly f/u OV. No further requests/concerns at this time, patient voiced thanks.

## 2015-09-25 DIAGNOSIS — Z23 Encounter for immunization: Secondary | ICD-10-CM | POA: Diagnosis not present

## 2015-11-13 ENCOUNTER — Ambulatory Visit (INDEPENDENT_AMBULATORY_CARE_PROVIDER_SITE_OTHER): Payer: BLUE CROSS/BLUE SHIELD | Admitting: Cardiovascular Disease

## 2015-11-13 ENCOUNTER — Encounter: Payer: Self-pay | Admitting: Cardiovascular Disease

## 2015-11-13 VITALS — BP 103/72 | HR 57 | Ht 75.0 in | Wt 254.8 lb

## 2015-11-13 DIAGNOSIS — I10 Essential (primary) hypertension: Secondary | ICD-10-CM | POA: Diagnosis not present

## 2015-11-13 DIAGNOSIS — I252 Old myocardial infarction: Secondary | ICD-10-CM

## 2015-11-13 DIAGNOSIS — E785 Hyperlipidemia, unspecified: Secondary | ICD-10-CM | POA: Diagnosis not present

## 2015-11-13 DIAGNOSIS — E669 Obesity, unspecified: Secondary | ICD-10-CM

## 2015-11-13 DIAGNOSIS — Z79899 Other long term (current) drug therapy: Secondary | ICD-10-CM

## 2015-11-13 DIAGNOSIS — I2510 Atherosclerotic heart disease of native coronary artery without angina pectoris: Secondary | ICD-10-CM

## 2015-11-13 DIAGNOSIS — I25810 Atherosclerosis of coronary artery bypass graft(s) without angina pectoris: Secondary | ICD-10-CM

## 2015-11-13 NOTE — Patient Instructions (Signed)
Your physician recommends that you return for lab work fasting.   Your physician wants you to follow-up in: 1 year or sooner if needed. You will receive a reminder letter in the mail two months in advance. If you don't receive a letter, please call our office to schedule the follow-up appointment.  If you need a refill on your cardiac medications before your next appointment, please call your pharmacy.    

## 2015-11-13 NOTE — Progress Notes (Signed)
Patient ID: Brandon Frederick, male   DOB: 1955/06/03, 60 y.o.   MRN: [id number]      HPI: Brandon Frederick is a 60 y.o. male who presents to the office for a 14 month cardiology evaluation.  Brandon Frederick  suffered a large anterior wall myocardial infarction in June 2004 due to total occlusion of his proximal LAD. He underwent successful stenting of his proximal LAD with a 3.5x20 mm Cypher stent postdilated to 4.64m.  In June 2009 he was found to have progressive disease in the mid LAD and an additional 2.75x18 mm Cypher stent was placed, postdilated to 3.35 to 3.25 taper. In addition, a 2.75x13 mm stent was placed in the PLA branch of his RCA postdilated 3.25 mm. Over the last several years he has remained stable without recurrent chest pain. His last laboratory in March 2014 showed excellent cholesterol at 133 triglycerides 132 HDL 54 LDL 53. An echo Doppler study in 2011 showed an ejection fraction of 40% with mild aortic valve sclerosis with anterior apical moderate to severe hypokinesis. His last nuclear perfusion study was done September 2013 which showed old the distal apical anterior scar without ischemia.   Since  I last saw him, he denies any episodes of chest pain or palpitations.  He denies dizziness.  He denies presyncope or syncope.  He is not routinely exercising but keeps very active working on cars.  He has not had recent blood work.  He admits to weight gain.  He has been on ramipril 2.5 mg twice a day with   He continues to be on dual antiplatelets therapy with aspirin and Plavix.  He is on lipid lowering therapy with Lipitor 40 mg.  He also is on Proscar.  He presents for one-year evaluation.  Past Medical History:  Diagnosis Date  . ASHD (arteriosclerotic heart disease)    stent  . Dyslipidemia   . History of stress test 09/2011   Which showed his previously documented scarring anteriorly and apically, concordant with his LAD infarction. Post-stress EF 40%  . Hx of echocardiogram    showed an EF of approximately 40% with moderate-to-severe anterior wall hypokenisis and moderate-to-severe apical wall hypokinesis. He did have mild aortic sclerosis. There was mild pulmonary hypertensin with estimated RV systolic pressure of 34 mm.  . Hyperlipidemia   . [place]  . MI (myocardial infarction) 2004, 2009   Large Anterior wall, at which he underwent stenting of his promixal occluded LAD with ultimate insertion of a 3.5 x 28 mm Cypher stent post dilated to 4.1 cm.    Past Surgical History:  Procedure Laterality Date  . CARDIAC CATHETERIZATION  06/2007    revealed a progressive disease in the mid LAD, and he underwent insertion of a new mid-LAD stents as well stenting of his PLA branch of his right coronary artery with a 2.75 x 30 mm Cypher stent post dilated 3.25 mm. He did also have some mild additional concomitant CAD.  .Marland KitchenCOLONOSCOPY    . CORONARY ANGIOPLASTY WITH STENT PLACEMENT      No Known Allergies  Current Outpatient Prescriptions  Medication Sig Dispense Refill  . aspirin 81 MG tablet Take 81 mg by mouth daily.      .Marland Kitchenatorvastatin (LIPITOR) 40 MG tablet Take 1 tablet (40 mg total) by mouth daily. 90 tablet 0  . clopidogrel (PLAVIX) 75 MG tablet Take 1 tablet (75 mg total) by mouth daily. 90 tablet 0  . finasteride (PROSCAR)  5 MG tablet Take 1 tablet (5 mg total) by mouth daily. 90 tablet 4  . metoprolol succinate (TOPROL-XL) 25 MG 24 hr tablet Take 1 tablet (25 mg total) by mouth daily. 90 tablet 0  . Multiple Vitamins-Minerals (MULTIVITAMIN WITH MINERALS) tablet Take 1 tablet by mouth daily.    . nitroGLYCERIN (NITROSTAT) 0.4 MG SL tablet Place 1 tablet (0.4 mg total) under the tongue every 5 (five) minutes as needed for chest pain. (Patient not taking: Reported on 08/08/2015) 30 tablet 3  . ramipril (ALTACE) 2.5 MG capsule Take 1 capsule (2.5 mg total) by mouth 2 (two) times daily. 180 capsule 0   Current Facility-Administered Medications    Medication Dose Route Frequency Provider Last Rate Last Dose  . 0.9 %  sodium chloride infusion  500 mL Intravenous Continuous Ladene Artist, MD        Social History   Social History  . Marital status: Married    Spouse name: N/A  . Number of children: 0  . Years of education: N/A   Occupational History  . retired [employer]    Social History Main Topics  . Smoking status: Never Smoker  . Smokeless tobacco: Never Used  . Alcohol use 0.0 oz/week     Comment: occ.  . Drug use: No  . Sexual activity: Yes   Other Topics Concern  . Not on file   Social History Narrative  . No narrative on file   Socially he is married with no children. There is no tobacco use. He does drink occasional alcohol. He recently bought a home  in [place] has been spending time at the [place] history is notable that both parents are alive, mother age 66 father age 72. He is a brother age 3 and a sister age 56. Maternal grandmother suffered an MI and died at 84  ROS General: Negative; No fevers, chills, or night sweats;  HEENT: Negative; No changes in vision or hearing, sinus congestion, difficulty swallowing Pulmonary: Negative; No cough, wheezing, shortness of breath, hemoptysis Cardiovascular: Negative; No chest pain, presyncope, syncope, palpitations GI: Negative; No nausea, vomiting, diarrhea, or abdominal pain GU: Negative; No dysuria, hematuria, or difficulty voiding Musculoskeletal: Negative; no myalgias, joint pain, or weakness Hematologic/Oncology: Negative; no easy bruising, bleeding Endocrine: Negative; no heat/cold intolerance; no diabetes Neuro: Transient right lateral thigh paresthesias; no changes in balance, headaches Skin: Negative; No rashes or skin lesions Psychiatric: Negative; No behavioral problems, depression Sleep: Negative; No snoring, daytime sleepiness, hypersomnolence, bruxism, restless legs, hypnogognic hallucinations, no cataplexy Other comprehensive 14 point  system review is negative.   PE BP 103/72   Pulse (!) 57   Ht '6\' 3"'  (1.905 m)   Wt 254 lb 12.8 oz (115.6 kg)   BMI 31.85 kg/m    Repeat blood pressure by me was 108/64 supine and 110/64 standing  Wt Readings from Last 3 Encounters:  11/13/15 254 lb 12.8 oz (115.6 kg)  08/08/15 257 lb (116.6 kg)  07/18/15 257 lb (116.6 kg)  . General: Alert, oriented, no distress.  Skin: normal turgor, no rashes HEENT: Normocephalic, atraumatic. Pupils round and reactive; sclera anicteric;no lid lag.  Nose without nasal septal hypertrophy Mouth/Parynx benign; Mallinpatti scale 2/3 Neck: No JVD, no carotid bruits with normal carotid upstroke Lungs: clear to ausculatation and percussion; no wheezing or rales Chest wall: No tenderness to palpation Heart: RRR, s1 s2 normal 1/6 systolic murmur compatible with his documented mild aortic valve sclerosis; no diastolic murmur. No rubs thrills  or heaves. Abdomen: Mild central adiposity; soft, nontender; no hepatosplenomehaly, BS+; abdominal aorta nontender and not dilated by palpation. Back: No CVA tenderness Pulses 2+ Extremities: no clubbing cyanosis or edema, Homan's sign negative  Neurologic: grossly nonfocal; cranial nerves grossly normal Psychological: Normal affect and mood.  ECG (independently read by me): Sinus bradycardia with first-degree AV block.  Ventricular rate 57 bpm.  PR interval 214 ms.  QS complex consistent with prior anteroseptal MI.  September 2016 ECG (independently read by me): , sinus bradycardia 54 bpm.  Anterior Q waves V1 through V4 concordant with his old anterior MI.  Normal intervals.    August 2015 ECG (independently read by me): Sinus rhythm at 64 beats per minute.  Old anterior wall myocardial infarction.  QTc interval 429 ms. ,  Nonspecific T. change  02/24/2013 ECG (independently read by me): Sinus rhythm at 60 beats per minute. Anterior Q waves V1 through V4 compatible with his prior anterior wall myocardial  infarction. QTc interval 470.  Prior ECG of 08/23/2012: Sinus rhythm at 66 beats per minute. Perineural 184 ms. Poor anterograde progression compatible with his old injury or myocardial infarction.  LABS: BMP Latest Ref Rng & Units 08/25/2014 03/15/2013 06/09/2007  Glucose 65 - 99 mg/dL 100(H) 100(H) 105(H)  BUN 8 - 27 mg/dL '13 15 10  ' Creatinine 0.76 - 1.27 mg/dL 1.05 1.05 1.27  BUN/Creat Ratio 10 - '22 12 14 ' -  Sodium 134 - 144 mmol/L 141 140 139  Potassium 3.5 - 5.2 mmol/L 4.5 4.5 3.8  Chloride 97 - 108 mmol/L 101 101 108  CO2 18 - 29 mmol/L '21 23 25  ' Calcium 8.6 - 10.2 mg/dL 9.3 9.2 8.9   Hepatic Function Latest Ref Rng & Units 08/25/2014 03/15/2013  Total Protein 6.0 - 8.5 g/dL 6.7 6.2  Albumin 3.6 - 4.8 g/dL 4.6 4.5  AST 0 - 40 IU/L 19 27  ALT 0 - 44 IU/L 30 45(H)  Alk Phosphatase 39 - 117 IU/L 83 72  Total Bilirubin 0.0 - 1.2 mg/dL 0.5 0.7   CBC Latest Ref Rng & Units 08/25/2014 03/15/2013 06/09/2007  WBC 3.4 - 10.8 x10E3/uL 6.8 7.3 7.8  Hemoglobin 12.6 - 17.7 g/dL - 15.3 13.6  Hematocrit 37.5 - 51.0 % 45.1 44.2 39.7  Platelets 150 - 379 x10E3/uL 172 167 147(L)   Lab Results  Component Value Date   MCV 90 08/25/2014   MCV 90 03/15/2013   MCV 92.3 06/09/2007   Lab Results  Component Value Date   TSH 2.700 08/25/2014  No results found for: HGBA1C  Lipid Panel     Component Value Date/Time   CHOL 138 08/25/2014 0946   TRIG 131 08/25/2014 0946   HDL 49 08/25/2014 0946   CHOLHDL 2.8 08/25/2014 0946   LDLCALC 63 08/25/2014 0946   RADIOLOGY: No results found.    ASSESSMENT AND PLAN: Brandon Frederick is  a 60 year old white male who suffered a large anterior wall myocardial infarction in June 2004 at which time he  underwent stenting of his proximal occluded LAD. In June 2009 a new LAD stent was inserted beyond the initially placed stent and he also underwent stenting of his PLA branch. Over the past 8 years, he has remained fairly stable without recurrent anginal symptoms. He is  maintaining dual antiplatelet therapy with aspirin 81 mg and Plavix 75 mg and denies bleeding.  Due to cost, he is no longer on Vytorin but has been taking atorvastatin 40 mg. he has not had recent  blood work.  LDL in August 2016, was excellent at 43.  His blood pressure today remains stable on Ramipril andToprol therapy.  A complete set of blood work will be obtained in the fasting state.  His BMI is 31.85 and is consistent with mild obesity.  I have recommended weight loss.  I have recommended aerobic exercise at least 5 days per week for 30 minutes if possible.  He continues to be active working on his cars and denies any anginal type symptoms.  As long as he remains stable I'll see him in one year for reevaluation. Time spent: 25 minutes  Troy Sine, MD, [facility]  11/13/2015 6:00 PM

## 2015-11-15 ENCOUNTER — Encounter: Payer: Self-pay | Admitting: Cardiovascular Disease

## 2015-11-15 DIAGNOSIS — Z79899 Other long term (current) drug therapy: Secondary | ICD-10-CM | POA: Diagnosis not present

## 2015-11-15 DIAGNOSIS — I10 Essential (primary) hypertension: Secondary | ICD-10-CM | POA: Diagnosis not present

## 2015-11-15 DIAGNOSIS — E785 Hyperlipidemia, unspecified: Secondary | ICD-10-CM | POA: Diagnosis not present

## 2015-11-15 DIAGNOSIS — I2510 Atherosclerotic heart disease of native coronary artery without angina pectoris: Secondary | ICD-10-CM | POA: Diagnosis not present

## 2015-11-15 LAB — CBC
HEMATOCRIT: 47 % (ref 38.5–50.0)
Hemoglobin: 16 g/dL (ref 13.2–17.1)
MCH: 30.7 pg (ref 27.0–33.0)
MCHC: 34 g/dL (ref 32.0–36.0)
MCV: 90.2 fL (ref 80.0–100.0)
MPV: 9.4 fL (ref 7.5–12.5)
PLATELETS: 191 10*3/uL (ref 140–400)
RBC: 5.21 MIL/uL (ref 4.20–5.80)
RDW: 14.4 % (ref 11.0–15.0)
WBC: 7.4 10*3/uL (ref 3.8–10.8)

## 2015-11-16 LAB — COMPREHENSIVE METABOLIC PANEL
ALT: 23 U/L (ref 9–46)
AST: 17 U/L (ref 10–35)
Albumin: 4.5 g/dL (ref 3.6–5.1)
Alkaline Phosphatase: 75 U/L (ref 40–115)
BILIRUBIN TOTAL: 1 mg/dL (ref 0.2–1.2)
BUN: 17 mg/dL (ref 7–25)
CALCIUM: 9.5 mg/dL (ref 8.6–10.3)
CO2: 24 mmol/L (ref 20–31)
Chloride: 105 mmol/L (ref 98–110)
Creat: 1.12 mg/dL (ref 0.70–1.25)
GLUCOSE: 93 mg/dL (ref 65–99)
POTASSIUM: 4.5 mmol/L (ref 3.5–5.3)
Sodium: 138 mmol/L (ref 135–146)
Total Protein: 7.1 g/dL (ref 6.1–8.1)

## 2015-11-16 LAB — LIPID PANEL
CHOL/HDL RATIO: 2.9 ratio (ref <5.0)
Cholesterol: 129 mg/dL (ref <200)
HDL: 44 mg/dL (ref >40)
LDL CALC: 58 mg/dL
Triglycerides: 137 mg/dL (ref <150)
VLDL: 27 mg/dL (ref <30)

## 2015-11-16 LAB — TSH: TSH: 1.89 m[IU]/L (ref 0.40–4.50)

## 2015-12-04 DIAGNOSIS — L821 Other seborrheic keratosis: Secondary | ICD-10-CM | POA: Diagnosis not present

## 2015-12-04 DIAGNOSIS — L814 Other melanin hyperpigmentation: Secondary | ICD-10-CM | POA: Diagnosis not present

## 2015-12-04 DIAGNOSIS — Z85820 Personal history of malignant melanoma of skin: Secondary | ICD-10-CM | POA: Diagnosis not present

## 2015-12-04 DIAGNOSIS — D235 Other benign neoplasm of skin of trunk: Secondary | ICD-10-CM | POA: Diagnosis not present

## 2015-12-06 ENCOUNTER — Ambulatory Visit (INDEPENDENT_AMBULATORY_CARE_PROVIDER_SITE_OTHER): Payer: BLUE CROSS/BLUE SHIELD | Admitting: Podiatry

## 2015-12-06 ENCOUNTER — Ambulatory Visit: Payer: BLUE CROSS/BLUE SHIELD

## 2015-12-06 ENCOUNTER — Ambulatory Visit (INDEPENDENT_AMBULATORY_CARE_PROVIDER_SITE_OTHER): Payer: BLUE CROSS/BLUE SHIELD

## 2015-12-06 ENCOUNTER — Telehealth: Payer: Self-pay | Admitting: *Deleted

## 2015-12-06 ENCOUNTER — Encounter: Payer: Self-pay | Admitting: Podiatry

## 2015-12-06 VITALS — BP 110/71 | HR 57 | Resp 16

## 2015-12-06 DIAGNOSIS — M2010 Hallux valgus (acquired), unspecified foot: Secondary | ICD-10-CM

## 2015-12-06 DIAGNOSIS — M2041 Other hammer toe(s) (acquired), right foot: Secondary | ICD-10-CM

## 2015-12-06 NOTE — Progress Notes (Signed)
   Subjective:    Patient ID: Brandon Frederick, male    DOB: 1955/09/08, 60 y.o.   MRN: [id number]  HPI: He presents today with a chief complaint of tenderness to the second digit of the left foot states it has been tender for the past several months initially the pain was at the base of the toe and saw another podiatrist to injected the toe twice at this knuckle joint as he points to the second metatarsophalangeal joint and did get some better but now he notices pain dorsally and the toe is hammered and stays red at the knuckle as he prefers to the PIPJ. He is also concerned about his bunion on the same foot.    Review of Systems  All other systems reviewed and are negative.      Objective:   Physical Exam: Vital signs are stable alert and oriented 3. Pulses are palpable. Neurologic sensorium is intact. Due to reflexes are intact. Muscle strength was 5 over 5 dorsiflexion plantar flexors and inverters everters onto the musculature is intact. Orthopedic evaluation demonstrates all joints distal to the ankle for range of motion without crepitation. He has hallux abductovalgus deformity of the left foot which is more rigid in nature. He also has hammertoe deformity and contractile deformity of the second metatarsophalangeal joint with rigidity rigidity of the PIPJ and plantar flexion. Radiographs 3 views bilateral foot taken today demonstrate an osseously mature individual with no fractures. Contracted hammertoe deformity secondary to encroachment of the hallux due to hallux valgus bilateral. Pre-dislocation syndrome is present.          Assessment & Plan:  Capsulitis hammertoe deformity with bunion deformities bilateral left greater than right.  Plan: Discussed the pros and cons of surgical intervention today and he will notify me with surgical window at which time he will come in for a consult.

## 2015-12-06 NOTE — Telephone Encounter (Signed)
"  This is [employer], thanks."

## 2015-12-07 NOTE — Telephone Encounter (Signed)
"  I need to schedule my surgery with Dr. Milinda Pointer.  I saw him yesterday and he said to call you."  Have you signed consent forms?  "No, I have not signed consent forms.  You mean to tell me I am going to have to see him again?"  Yes, you will need to see him to sign consent forms and get information about the surgical center.  I can tentatively schedule you for surgery.  "Okay, let's do that.  I would like to do it towards the beginning of January."  He can do it January 12.  "That date will be fine."  I'll transfer you to a scheduler to make an appointment.

## 2016-01-01 ENCOUNTER — Other Ambulatory Visit: Payer: Self-pay | Admitting: Cardiovascular Disease

## 2016-01-01 MED ORDER — METOPROLOL SUCCINATE ER 25 MG PO TB24: 25.0000 mg | ORAL_TABLET | Freq: Every day | ORAL | 3 refills | Status: DC

## 2016-01-01 MED ORDER — CLOPIDOGREL BISULFATE 75 MG PO TABS: 75.0000 mg | ORAL_TABLET | Freq: Every day | ORAL | 3 refills | Status: DC

## 2016-01-01 MED ORDER — ATORVASTATIN CALCIUM 40 MG PO TABS: 40.0000 mg | ORAL_TABLET | Freq: Every day | ORAL | 3 refills | Status: DC

## 2016-01-01 MED ORDER — RAMIPRIL 2.5 MG PO CAPS: 2.5000 mg | ORAL_CAPSULE | Freq: Two times a day (BID) | ORAL | 3 refills | Status: DC

## 2016-01-08 ENCOUNTER — Encounter: Payer: Self-pay | Admitting: Podiatry

## 2016-01-08 ENCOUNTER — Ambulatory Visit (INDEPENDENT_AMBULATORY_CARE_PROVIDER_SITE_OTHER): Payer: BLUE CROSS/BLUE SHIELD | Admitting: Podiatry

## 2016-01-08 DIAGNOSIS — M2010 Hallux valgus (acquired), unspecified foot: Secondary | ICD-10-CM

## 2016-01-08 DIAGNOSIS — M2041 Other hammer toe(s) (acquired), right foot: Secondary | ICD-10-CM | POA: Diagnosis not present

## 2016-01-08 NOTE — Patient Instructions (Signed)

## 2016-01-08 NOTE — Progress Notes (Signed)
Brandon Frederick presents today with his wife with a chief complaint of painful hammertoe deformity second digit of the right foot. He states that these toes and become more painful over the past few weeks and months. He also realizes that his great toe is more painful as well. He states it is starting to affect his ability to perform his daily activities without having to stop. Also interfering with personal activities and physical  exercise.  Objective: I have reviewed his past mental history medications allergies surgeries and social history he has a history of peroneal artery disease with 3 stent placements. Otherwise he is in pretty decent health. He has rigid hammertoe deformity second PIPJ second digit right with rigid deformity overlying the second metatarsophalangeal joint. Hallux abductovalgus deformity of the right foot is also noted with some osteoarthritic changes.  Assessment: Hammertoe deformity and plantar flex elongated second metatarsal right foot. Hallux abductovalgus deformity right foot.  Plan: Discussed etiology pathology conservative versus surgical therapies at this point we consented him today for an [address] bunion repair second metatarsal osteotomy hammertoe repair with screw and/or pins second digit right foot. I answered all the questions regarding these procedures to the best of my ability in layman's terms. He understood this was amenable to it and find all 3 cages of the consent form. We did discuss the possible postoperative complications which may include but are not limited to postop pain bleeding swelling infection recurrence and further surgery overcorrection and under correction. We dispensed a cam walker for his postop recovery period. He signed all 3 pages of the consent form. And we discussed the day surgery procedure and anesthesia [facility] surgical center.

## 2016-01-09 ENCOUNTER — Telehealth: Payer: Self-pay | Admitting: Podiatry

## 2016-01-09 NOTE — Telephone Encounter (Signed)
Pt called asking for a more detailed medication list for his surgery scheduled for 1.12.18 with Dr Milinda Pointer.He is needing to know mg and how often he will need to take and quantity so he can shop for them to get the best price.

## 2016-01-11 ENCOUNTER — Telehealth: Payer: Self-pay | Admitting: Family Medicine

## 2016-01-11 ENCOUNTER — Encounter: Payer: Self-pay | Admitting: Family Medicine

## 2016-01-11 ENCOUNTER — Other Ambulatory Visit: Payer: Self-pay | Admitting: Family Medicine

## 2016-01-11 ENCOUNTER — Telehealth: Payer: Self-pay | Admitting: *Deleted

## 2016-01-11 DIAGNOSIS — N400 Enlarged prostate without lower urinary tract symptoms: Secondary | ICD-10-CM

## 2016-01-11 NOTE — Telephone Encounter (Signed)
Is this okay to refill? 

## 2016-01-11 NOTE — Telephone Encounter (Addendum)
Pt states he is trying to get information for the medications he would need after the surgery. Pt states he has saved over 2/3rd of the cost on his every day medications and is trying to find the best price on the post op medication. I told pt the pain medication would best be hand carried to his pharmacy rather than waiting for the mail order. I told him I could only guess as to his rxs post op but his pain medication may be Percocet 5mg  #30 one tablet 4-6 every hours, zofran 4mg  #30 one tablet every 4 hours prn nausea, and Keflex 500mg  #15-21 one capsule tid. I told pt that it may vary for him and pt states he will be getting dilaudid and I told him he may get 4mg  dosing #30 one tablet every 4-6 hours, but I wasn't sure. 01/27/2016-Pt sent email through Pt Advice Request. 01/28/2016-I spoke with pt and he stated he spoke with on-call doctor and doctor told pt to remove all but the gauze and elevate. Pt states he got relief and now feels sore. Pt states he had already gone through a bottle of ibuprofen and because he didn't want to take any of those pills, but ended up taking quite a few and a shot of liquor. I told pt to call if he with concerns.

## 2016-01-11 NOTE — Telephone Encounter (Signed)
Pt called back and declined to make an appt. He states he is fine and doesn't see the need for him to come in. I attempted to explain that he needed to be seen once a year and he stated that he wasn't making an appt. He has a high deductible plan and did not see the need to spend a lot of money when he is fine.

## 2016-01-11 NOTE — Telephone Encounter (Signed)
He has not been seen in over a year so he needs an appointment

## 2016-01-16 ENCOUNTER — Encounter: Payer: Self-pay | Admitting: Podiatry

## 2016-01-16 ENCOUNTER — Telehealth: Payer: Self-pay | Admitting: Cardiovascular Disease

## 2016-01-16 ENCOUNTER — Encounter: Payer: Self-pay | Admitting: *Deleted

## 2016-01-16 ENCOUNTER — Telehealth: Payer: Self-pay | Admitting: *Deleted

## 2016-01-16 NOTE — Telephone Encounter (Signed)
I wanted to let you know I sent a letter to Dr. Claiborne Billings today.  Hopefully we will hear something soon.  "I sent an email inquiring because I hadn't heard anything.  I know when I had my colonoscopy, I had to stop one of the medicines a couple of days before.  You may want to call later today.  He is not good at doing electronic responses."  I will call him later if I don't hear from him this morning.

## 2016-01-16 NOTE — Telephone Encounter (Signed)
I am calling to see if Dr. Claiborne Billings can give patient medical clearance and let him know if it's okay to stop Plavix and any other medications he feel need to be stopped prior to surgery.  He is scheduled for outpatient surgery on Friday.  "What is he having?"  He's having an Austin Bunionectomy, Metatarsal Osteotomy and a Forensic psychologist.  "When did you send the message and how?"  I sent it this morning via in basket.  He doesn't check in basket frequently.  He's still here.  I will ask him and give you a call back or send a letter.  What's you fax number?  It is [phone].  "This is Nate.  I am returning your call.  When is surgery?  It is scheduled for 01/18/2016.  "You may have to postpone surgery because stopping the Plavix normally needs to be done 5-7 days prior to surgery date.  I'll contact Dr. Claiborne Billings and see what he suggests.  We'll give you a call back."

## 2016-01-16 NOTE — Telephone Encounter (Signed)
Spoke to [facility]. She confirms intended surgery date in 2 days. Notes patient has not held Plavix yet for procedure. I advised in this case that surgery will probably need to be delayed if intention is for Plavix to be held (min 5-7 day hold) Informed her I would send clearance request to Dr. Claiborne Billings and return her call once clearance authorized or other instruction given, so that patient can be rescheduled.

## 2016-01-16 NOTE — Telephone Encounter (Signed)
New message       Request for surgical clearance:  1. What type of surgery is being performed?  Bunionectomy, hammer toe repair, metatarsalosteotomy  2. When is this surgery scheduled?  01-18-16  Are there any medications that need to be held prior to surgery and how long?  Hold plavix and cardiac clearance Name of physician performing surgery?  Dr Milinda Pointer What is your office phone and fax number?  Fax [phone]

## 2016-01-17 ENCOUNTER — Encounter: Payer: Self-pay | Admitting: Podiatry

## 2016-01-17 ENCOUNTER — Other Ambulatory Visit: Payer: Self-pay | Admitting: Podiatry

## 2016-01-17 MED ORDER — CEPHALEXIN 500 MG PO CAPS: 500.0000 mg | ORAL_CAPSULE | Freq: Three times a day (TID) | ORAL | 0 refills | Status: DC

## 2016-01-17 MED ORDER — ONDANSETRON HCL 4 MG PO TABS: 4.0000 mg | ORAL_TABLET | Freq: Three times a day (TID) | ORAL | 0 refills | Status: DC | PRN

## 2016-01-17 MED ORDER — HYDROMORPHONE HCL 4 MG PO TABS: 4.0000 mg | ORAL_TABLET | ORAL | 0 refills | Status: DC | PRN

## 2016-01-17 NOTE — Telephone Encounter (Signed)
"  I haven't heard from anyone about my surgery.  Is it still on or what?  You told me you were going to contact Dr. Evette Georges office and give me a call back."  I spoke with Nate yesterday and he said he would contact me with a response from Dr. Claiborne Billings.  "Well I guess I will have to take this into my own hands as well.  Are you going to call and reschedule my surgery at the surgical center or is this something I'm going to have to take care of too!  Are you capable of handling that?"  Yes sir, I had already contacted the surgical center and told them you may have to move to next Friday.  "It is no maybe, isn't it fact!  Are you sure I don't need to call them myself?"  I will call and reschedule your surgery sir.  I am calling to let you know we received a message from Dr. Claiborne Billings.  He suggests you stop taking the Plavix 5 days prior to surgery date.  "Five days, okay I don't take it on Monday?"  You will stop taking it on Sunday.  "Alright, thank you."

## 2016-01-17 NOTE — Telephone Encounter (Signed)
I am calling to let you know I spoke to Nate, Dr. Evette Georges nurse, yesterday.  He stated that we may have to reschedule your surgery to next week.  He said Dr. Claiborne Billings normally has his patients to stop taking Plavix five to seven days prior to surgery date.  He is waiting on a response from Dr. Claiborne Billings.  "This is unacceptable.  I was there to see Dr. Milinda Pointer a little over a week ago.  You are responsible for this.  I had to call you to inquire if it had been done."  I take full responsibility and I apologize to you.  "This is not acceptable.  I made several rearrangements of my schedule to have this done.  I am very appalled!"  Again I apologize, I am so sorry for any problems this may cause.  "Well, just let me know what you find out!"

## 2016-01-17 NOTE — Telephone Encounter (Signed)
Electronically routed to requesting office.

## 2016-01-17 NOTE — Telephone Encounter (Signed)
Should hold Plavix for 5 days prior to the procedure to reduce bleeding risk.

## 2016-01-24 ENCOUNTER — Other Ambulatory Visit: Payer: BLUE CROSS/BLUE SHIELD

## 2016-01-24 ENCOUNTER — Other Ambulatory Visit: Payer: Self-pay | Admitting: Podiatry

## 2016-01-24 ENCOUNTER — Encounter: Payer: Self-pay | Admitting: Podiatry

## 2016-01-24 MED ORDER — HYDROMORPHONE HCL 4 MG PO TABS: 4.0000 mg | ORAL_TABLET | ORAL | 0 refills | Status: DC | PRN

## 2016-01-24 MED ORDER — CEPHALEXIN 500 MG PO CAPS: 500.0000 mg | ORAL_CAPSULE | Freq: Three times a day (TID) | ORAL | 0 refills | Status: DC

## 2016-01-24 MED ORDER — ONDANSETRON HCL 4 MG PO TABS: 4.0000 mg | ORAL_TABLET | Freq: Three times a day (TID) | ORAL | 0 refills | Status: DC | PRN

## 2016-01-25 ENCOUNTER — Encounter: Payer: Self-pay | Admitting: Podiatry

## 2016-01-25 DIAGNOSIS — M2041 Other hammer toe(s) (acquired), right foot: Secondary | ICD-10-CM | POA: Diagnosis not present

## 2016-01-25 DIAGNOSIS — M21541 Acquired clubfoot, right foot: Secondary | ICD-10-CM | POA: Diagnosis not present

## 2016-01-25 DIAGNOSIS — M2011 Hallux valgus (acquired), right foot: Secondary | ICD-10-CM | POA: Diagnosis not present

## 2016-01-25 DIAGNOSIS — M21611 Bunion of right foot: Secondary | ICD-10-CM | POA: Diagnosis not present

## 2016-01-25 DIAGNOSIS — M216X1 Other acquired deformities of right foot: Secondary | ICD-10-CM | POA: Diagnosis not present

## 2016-01-25 DIAGNOSIS — E7800 Pure hypercholesterolemia, unspecified: Secondary | ICD-10-CM | POA: Diagnosis not present

## 2016-01-25 DIAGNOSIS — M25571 Pain in right ankle and joints of right foot: Secondary | ICD-10-CM | POA: Diagnosis not present

## 2016-01-27 ENCOUNTER — Encounter: Payer: Self-pay | Admitting: Podiatry

## 2016-01-31 ENCOUNTER — Ambulatory Visit (INDEPENDENT_AMBULATORY_CARE_PROVIDER_SITE_OTHER): Payer: Self-pay | Admitting: Podiatry

## 2016-01-31 ENCOUNTER — Encounter: Payer: Self-pay | Admitting: Podiatry

## 2016-01-31 ENCOUNTER — Ambulatory Visit (INDEPENDENT_AMBULATORY_CARE_PROVIDER_SITE_OTHER): Payer: BLUE CROSS/BLUE SHIELD

## 2016-01-31 VITALS — BP 123/86 | HR 65 | Temp 97.8°F

## 2016-01-31 DIAGNOSIS — M2010 Hallux valgus (acquired), unspecified foot: Secondary | ICD-10-CM

## 2016-01-31 DIAGNOSIS — M2041 Other hammer toe(s) (acquired), right foot: Secondary | ICD-10-CM

## 2016-01-31 MED ORDER — OXYCODONE-ACETAMINOPHEN 10-325 MG PO TABS: 1.0000 | ORAL_TABLET | Freq: Four times a day (QID) | ORAL | 0 refills | Status: DC | PRN

## 2016-01-31 NOTE — Progress Notes (Signed)
He presents today as a first follow-up visit status post Liane Comber bunionectomy right second metatarsal osteotomy right hammertoe deformity with repair NP and right foot. He states that he's had a severe episode of pain to the right foot over the weekend but it has improved. He states that he has not been walking on the foot. His wife confirms this.  Objective: Dry sterile dressing was removed demonstrates minimal blood to the dressing itself however his foot is swollen and red with moderate severe ecchymosis some blistering around the incision site is good range of motion of the joint radiographs do not demonstrate any type of trauma.  Assessment: First postop visit date of surgery 01/25/2016 right foot.  Plan: Redressed the foot today dressed with compressive dressing encouraged him to keep the foot elevated stay off of it and we will follow-up with him in 1 week. Sutures will not be ready to be removed at that time he is to remain in his cam walker.

## 2016-02-04 NOTE — Progress Notes (Signed)
DOS 01.19.2018 Austin bunion repair with screws right. 2nd metatarsal osteotomy right with screws. Hammertoe repair with screw or pin 2nd toe right foot.

## 2016-02-07 ENCOUNTER — Ambulatory Visit (INDEPENDENT_AMBULATORY_CARE_PROVIDER_SITE_OTHER): Payer: BLUE CROSS/BLUE SHIELD | Admitting: Podiatry

## 2016-02-07 ENCOUNTER — Other Ambulatory Visit: Payer: BLUE CROSS/BLUE SHIELD

## 2016-02-07 DIAGNOSIS — Z9889 Other specified postprocedural states: Secondary | ICD-10-CM

## 2016-02-07 DIAGNOSIS — M2011 Hallux valgus (acquired), right foot: Secondary | ICD-10-CM

## 2016-02-07 DIAGNOSIS — M216X1 Other acquired deformities of right foot: Secondary | ICD-10-CM

## 2016-02-07 DIAGNOSIS — M2041 Other hammer toe(s) (acquired), right foot: Secondary | ICD-10-CM

## 2016-02-07 NOTE — Progress Notes (Signed)
Subjective:     Patient ID: Brandon Frederick, male   DOB: 11/21/1955, 61 y.o.   MRN: [id number]  HPI patient's doing well but states he is frustrated that the sutures are not to be removed today. 2 weeks after surgery by Dr. Milinda Pointer   Review of Systems     Objective:   Physical Exam Neurovascular status intact negative Homans sign was noted with well coapted incision site right first and second metatarsal with pin in place second toe wound edges well coapted and good alignment noted    Assessment:     Doing well post surgery right    Plan:     I did apply sterile dressing but instructed on getting this wet over the next few days and he will reappoint for suture removal next Tuesday earlier if needed and will continue immobilization elevation in toe that time

## 2016-02-12 ENCOUNTER — Ambulatory Visit (INDEPENDENT_AMBULATORY_CARE_PROVIDER_SITE_OTHER): Payer: BLUE CROSS/BLUE SHIELD | Admitting: Podiatry

## 2016-02-12 ENCOUNTER — Encounter: Payer: Self-pay | Admitting: Podiatry

## 2016-02-12 VITALS — BP 124/81 | HR 65 | Temp 97.9°F

## 2016-02-12 DIAGNOSIS — M2041 Other hammer toe(s) (acquired), right foot: Secondary | ICD-10-CM

## 2016-02-12 DIAGNOSIS — M2011 Hallux valgus (acquired), right foot: Secondary | ICD-10-CM | POA: Diagnosis not present

## 2016-02-12 NOTE — Progress Notes (Signed)
He presents today for follow-up of his right foot. Date of surgery 01/25/2016 status post [facility] bunion repair screws right second metatarsal osteotomy screws hammertoe repair second digit right foot with pin. He states that my foot feels still feels very tight around it.  Objective: Vital signs are stable he is alert and oriented 3 states he's been doing less on his foot recently. He feels that the swelling has gone down. He continues to change dressing on a daily basis.  Assessment well-healing surgical foot sutures were removed today. K wires in place of the second toe. His good range of motion of the first metatarsophalangeal joint there is some mild dehiscence of the wound secondary to the edema I'm going allow him to start soaking this and some Epsom salts and warm water and apply dressing. Follow up with him in 2 weeks I dispensed a Darco shoe.

## 2016-02-26 ENCOUNTER — Ambulatory Visit (INDEPENDENT_AMBULATORY_CARE_PROVIDER_SITE_OTHER): Payer: BLUE CROSS/BLUE SHIELD | Admitting: Podiatry

## 2016-02-26 ENCOUNTER — Ambulatory Visit (INDEPENDENT_AMBULATORY_CARE_PROVIDER_SITE_OTHER): Payer: BLUE CROSS/BLUE SHIELD

## 2016-02-26 DIAGNOSIS — M216X1 Other acquired deformities of right foot: Secondary | ICD-10-CM

## 2016-02-26 DIAGNOSIS — M2011 Hallux valgus (acquired), right foot: Secondary | ICD-10-CM | POA: Diagnosis not present

## 2016-02-26 DIAGNOSIS — M2041 Other hammer toe(s) (acquired), right foot: Secondary | ICD-10-CM

## 2016-02-27 NOTE — Progress Notes (Signed)
He presents today one month status post Austin bunion repair right foot second metatarsal osteotomy right foot and hammertoe with pin second digit right foot. Date of surgery 01/25/2016. He states that he is ready to have the pin removed. He states that there is no pain.  Objective: Vital signs are stable he is alert and oriented 3 mild edema about the first metatarsophalangeal joint his wife states that he has tried to be good bit is still on the foot a considerable amount. Incision sites and gone on to heal uneventfully K wire still in place of the second toe without signs of infection. Radiographs taken today do demonstrate well-healing osteotomy we still do not have a complete arthrodesis at the PIPJ second digit right.  Assessment: Well-healing surgical foot right.  Plan: Follow-up with him in 2 weeks to pull the pin. I encouraged him to wear his Darco shoe and keep foot elevated as much as possible.

## 2016-03-11 ENCOUNTER — Ambulatory Visit (INDEPENDENT_AMBULATORY_CARE_PROVIDER_SITE_OTHER): Payer: Self-pay | Admitting: Podiatry

## 2016-03-11 ENCOUNTER — Ambulatory Visit (INDEPENDENT_AMBULATORY_CARE_PROVIDER_SITE_OTHER): Payer: BLUE CROSS/BLUE SHIELD

## 2016-03-11 DIAGNOSIS — M2011 Hallux valgus (acquired), right foot: Secondary | ICD-10-CM

## 2016-03-11 DIAGNOSIS — M2041 Other hammer toe(s) (acquired), right foot: Secondary | ICD-10-CM

## 2016-03-11 DIAGNOSIS — Z9889 Other specified postprocedural states: Secondary | ICD-10-CM

## 2016-03-11 NOTE — Progress Notes (Signed)
He presents today for follow-up of his Brandon Frederick bunion repair right second metatarsal osteotomy right hammertoe repair with pin second digit right foot. He's ready to have this pin removed. Date of surgery 01/25/2016.  Objective: Vital signs are stable he is alert and oriented 3 foot his condition considerably swollen and he admits to being up on the foot quite a bit. No erythema cellulitis drainage or odor K wires in place to the second toe. Radiographs taken today demonstrate well-healed PIPJ fusion. Well-healed first metatarsal and second metatarsal osteotomies.  Assessment: Well-healing bunion and second metatarsal osteotomies with hammertoe repair and plan right second toe.  Plan: Removal of the pin. I would allow him to get back to his regular routine is possible I did warn him about extending his toes and squatting. He understands this and is amenable to it. I will follow-up with him in 2 weeks to 1 month.

## 2016-04-01 ENCOUNTER — Ambulatory Visit (INDEPENDENT_AMBULATORY_CARE_PROVIDER_SITE_OTHER): Payer: Self-pay | Admitting: Podiatry

## 2016-04-01 ENCOUNTER — Ambulatory Visit (INDEPENDENT_AMBULATORY_CARE_PROVIDER_SITE_OTHER): Payer: BLUE CROSS/BLUE SHIELD

## 2016-04-01 DIAGNOSIS — M2011 Hallux valgus (acquired), right foot: Secondary | ICD-10-CM | POA: Diagnosis not present

## 2016-04-01 DIAGNOSIS — M2041 Other hammer toe(s) (acquired), right foot: Secondary | ICD-10-CM | POA: Diagnosis not present

## 2016-04-01 DIAGNOSIS — Z9889 Other specified postprocedural states: Secondary | ICD-10-CM

## 2016-04-02 NOTE — Progress Notes (Signed)
He presents today for follow-up of his Liane Comber bunionectomy right foot metatarsal osteotomy second right and hammertoe repair second right he states to locate but is still sore thick and swollen I, where surgery shoes and occasionally get numbness. Date of surgery 01/25/2016.  Objective: Vital signs are stable is alert and oriented 3 still retains thick tissue around the first metatarsophalangeal joint. Radius taken today demonstrate well-healing osteotomy with good internal fixation and good positioning.  Assessment: Well-healing surgical foot.  Plan: Follow up with me in 1 month if necessary. Otherwise I'll follow up with him as needed.

## 2016-04-03 ENCOUNTER — Ambulatory Visit: Payer: BLUE CROSS/BLUE SHIELD

## 2016-04-08 ENCOUNTER — Telehealth: Payer: Self-pay

## 2016-04-08 ENCOUNTER — Encounter: Payer: Self-pay | Admitting: Family Medicine

## 2016-04-08 ENCOUNTER — Ambulatory Visit (INDEPENDENT_AMBULATORY_CARE_PROVIDER_SITE_OTHER): Payer: BLUE CROSS/BLUE SHIELD | Admitting: Family Medicine

## 2016-04-08 ENCOUNTER — Other Ambulatory Visit: Payer: Self-pay

## 2016-04-08 VITALS — BP 126/80 | HR 60 | Ht 74.0 in | Wt 256.0 lb

## 2016-04-08 DIAGNOSIS — Z1159 Encounter for screening for other viral diseases: Secondary | ICD-10-CM

## 2016-04-08 DIAGNOSIS — I2510 Atherosclerotic heart disease of native coronary artery without angina pectoris: Secondary | ICD-10-CM | POA: Diagnosis not present

## 2016-04-08 DIAGNOSIS — E669 Obesity, unspecified: Secondary | ICD-10-CM

## 2016-04-08 DIAGNOSIS — N400 Enlarged prostate without lower urinary tract symptoms: Secondary | ICD-10-CM

## 2016-04-08 DIAGNOSIS — I10 Essential (primary) hypertension: Secondary | ICD-10-CM | POA: Diagnosis not present

## 2016-04-08 DIAGNOSIS — Z23 Encounter for immunization: Secondary | ICD-10-CM | POA: Diagnosis not present

## 2016-04-08 DIAGNOSIS — E785 Hyperlipidemia, unspecified: Secondary | ICD-10-CM | POA: Diagnosis not present

## 2016-04-08 MED ORDER — FINASTERIDE 5 MG PO TABS: 5.0000 mg | ORAL_TABLET | Freq: Every day | ORAL | 3 refills | Status: DC

## 2016-04-08 NOTE — Progress Notes (Signed)
   Subjective:    Patient ID: Brandon Frederick, male    DOB: 23-Jun-1955, 61 y.o.   MRN: [id number]  HPI He is here for a follow-up. He has not been seen in over a year. He does have an underlying history of ASHD with stents. The original problem occurred in 2004 and then again in 2009. He is followed regularly by cardiology. He has had no difficulty recently with chest pain, shortness of breath, DOE. He also has a history of BPH and has been on finasteride. He states that he is having no difficulty with urination. His stream is good and he has no hesitancy. He has had a colonoscopy. He recently had foot surgery. His medications were reviewed. Immunizations and health maintenance were reviewed.  Review of Systems     Objective:   Physical Exam Alert and in no distress otherwise not examined.       Assessment & Plan:  Need for shingles vaccine - Plan: Varicella-zoster vaccine IM (Shingrix)  Benign prostatic hyperplasia without lower urinary tract symptoms - Plan: finasteride (PROSCAR) 5 MG tablet  ASHD (arteriosclerotic heart disease)  Hyperlipidemia with target LDL less than 70  Essential hypertension  Mild obesity  Need for Tdap vaccination - Plan: Tdap vaccine greater than or equal to 7yo IM  Need for hepatitis C screening test - Plan: Hepatitis C antibody His immunizations were updated with the above shots. He continues to do well concerning his underlying heart disease as well as BPH. He will continue on his present medications. Did encourage him to stay physically active and make further changes in his diet in regard to carbohydrates.

## 2016-04-08 NOTE — Telephone Encounter (Signed)
[facility] Request rcvd via fax. Pt is here for appt now if you are able to share this with JCL.  Victorino December

## 2016-04-08 NOTE — Telephone Encounter (Signed)
Sent med in 

## 2016-04-09 LAB — HEPATITIS C ANTIBODY: HCV Ab: NEGATIVE

## 2016-05-13 ENCOUNTER — Ambulatory Visit (INDEPENDENT_AMBULATORY_CARE_PROVIDER_SITE_OTHER): Payer: Self-pay | Admitting: Podiatry

## 2016-05-13 ENCOUNTER — Ambulatory Visit (INDEPENDENT_AMBULATORY_CARE_PROVIDER_SITE_OTHER): Payer: BLUE CROSS/BLUE SHIELD

## 2016-05-13 DIAGNOSIS — Z9889 Other specified postprocedural states: Secondary | ICD-10-CM

## 2016-05-13 NOTE — Progress Notes (Signed)
He presents today for follow-up of his Brandon Frederick bunion repair second metatarsal osteotomy and hammertoe repair second right. He states this still numb but all in all is seems to be doing pretty well.  Objective: Vital signs are stable he is alert and oriented 3. Pulses are strongly palpable. He has great range of motion at first metatarsophalangeal joint of the right foot is some mild lateral deviation of the hallux and second toe but in alignment with the lesser digits. Radiographs demonstrate a well-healed osteotomy first and second toes.  Assessment: Well-healing surgical foot right.  Plan: Encourage range of motion exercises I will follow-up with him on an as-needed basis.

## 2016-07-15 ENCOUNTER — Other Ambulatory Visit (INDEPENDENT_AMBULATORY_CARE_PROVIDER_SITE_OTHER): Payer: BLUE CROSS/BLUE SHIELD

## 2016-07-15 DIAGNOSIS — Z23 Encounter for immunization: Secondary | ICD-10-CM | POA: Diagnosis not present

## 2016-08-06 DIAGNOSIS — H04123 Dry eye syndrome of bilateral lacrimal glands: Secondary | ICD-10-CM | POA: Diagnosis not present

## 2016-11-20 ENCOUNTER — Encounter: Payer: Self-pay | Admitting: Cardiovascular Disease

## 2016-11-20 ENCOUNTER — Ambulatory Visit (INDEPENDENT_AMBULATORY_CARE_PROVIDER_SITE_OTHER): Payer: BLUE CROSS/BLUE SHIELD | Admitting: Cardiovascular Disease

## 2016-11-20 VITALS — BP 120/86 | HR 65 | Ht 75.0 in | Wt 260.0 lb

## 2016-11-20 DIAGNOSIS — E669 Obesity, unspecified: Secondary | ICD-10-CM

## 2016-11-20 DIAGNOSIS — I10 Essential (primary) hypertension: Secondary | ICD-10-CM

## 2016-11-20 DIAGNOSIS — I252 Old myocardial infarction: Secondary | ICD-10-CM | POA: Diagnosis not present

## 2016-11-20 DIAGNOSIS — E785 Hyperlipidemia, unspecified: Secondary | ICD-10-CM

## 2016-11-20 DIAGNOSIS — Z79899 Other long term (current) drug therapy: Secondary | ICD-10-CM

## 2016-11-20 DIAGNOSIS — I2510 Atherosclerotic heart disease of native coronary artery without angina pectoris: Secondary | ICD-10-CM | POA: Diagnosis not present

## 2016-11-20 NOTE — Progress Notes (Signed)
Patient ID: Brandon Frederick, male   DOB: Feb 23, 1955, 61 y.o.   MRN: [id number]      HPI: Brandon Frederick is a 61 y.o. male who presents to the office for a 12 month cardiology evaluation.  Brandon Frederick  suffered a large anterior wall myocardial infarction in June 2004 due to total occlusion of his proximal LAD. He underwent successful stenting of his proximal LAD with a 3.5x20 mm Cypher stent postdilated to 4.33m.  In June 2009 he was found to have progressive disease in the mid LAD and an additional 2.75x18 mm Cypher stent was placed, postdilated to 3.35 to 3.25 taper. In addition, a 2.75x13 mm stent was placed in the PLA branch of his RCA postdilated 3.25 mm. Over the last several years he has remained stable without recurrent chest pain. His last laboratory in March 2014 showed excellent cholesterol at 133 triglycerides 132 HDL 54 LDL 53. An echo Doppler study in 2011 showed an ejection fraction of 40% with mild aortic valve sclerosis with anterior apical moderate to severe hypokinesis. His last nuclear perfusion study in September 2013 demonstrated distal apical anterior scar without ischemia.   Since  I last saw him, he has remained cardiac stable over the past year.  He specifically denies any exertional chest pain, PND, orthopnea, or palpitations.  He has continued to be on aspirin and Plavix for dual antiplatelet therapy.  He is on ramipril 2.5 mg, Toprol-XL 25 mg for his CAD and hypertension.  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  He has continued to be on atorvastatin 40 mg daily for hyperlipidemia, and Proscar 5 mg daily. He presents for reevaluation.   Past Medical History:  Diagnosis Date  . ASHD (arteriosclerotic heart disease)    stent  . Dyslipidemia   . History of stress test 09/2011   Which showed his previously documented scarring anteriorly and apically, concordant with his LAD infarction. Post-stress EF  40%  . Hx of echocardiogram    showed an EF of approximately 40% with moderate-to-severe anterior wall hypokenisis and moderate-to-severe apical wall hypokinesis. He did have mild aortic sclerosis. There was mild pulmonary hypertensin with estimated RV systolic pressure of 34 mm.  . Hyperlipidemia   . [place]an[place] ([place]    torso  . [place] ([place]ocardial infarction) ([place]   Large Anterior wall, at which he underwent stenting of his promixal occluded LAD with ultimate insertion of a 3.5 x 28 mm Cypher stent post dilated to 4.1 cm.    Past Surgical History:  Procedure Laterality Date  . CARDIAC CATHETERIZATION  06/2007    revealed a progressive disease in the mid LAD, and he underwent insertion of a new mid-LAD stents as well stenting of his PLA branch of his right coronary artery with a 2.75 x 30 mm Cypher stent post dilated 3.25 mm. He did also have some mild additional concomitant CAD.  .Marland KitchenCOLONOSCOPY    . CORONARY ANGIOPLASTY WITH STENT PLACEMENT      No Known Allergies  Current Outpatient Medications  Medication Sig Dispense Refill  . aspirin 81 MG tablet Take 81 mg by mouth daily.      .Marland Kitchenatorvastatin (LIPITOR) 40 MG tablet Take 1 tablet (40 mg total) by mouth daily. 90 tablet 3  . clopidogrel (PLAVIX) 75 MG tablet Take 1 tablet (75 mg total) by mouth daily. 90 tablet 3  . finasteride (PROSCAR) 5 MG  tablet Take 1 tablet (5 mg total) by mouth daily. 90 tablet 3  . metoprolol succinate (TOPROL-XL) 25 MG 24 hr tablet Take 1 tablet (25 mg total) by mouth daily. 90 tablet 3  . Multiple Vitamins-Minerals (MULTIVITAMIN WITH MINERALS) tablet Take 1 tablet by mouth daily.    . nitroGLYCERIN (NITROSTAT) 0.4 MG SL tablet Place 1 tablet (0.4 mg total) under the tongue every 5 (five) minutes as needed for chest pain. 30 tablet 3  . ramipril (ALTACE) 2.5 MG capsule Take 1 capsule (2.5 mg total) by mouth 2 (two) times daily. 180 capsule 3   Current Facility-Administered Medications  Medication Dose  Route Frequency Provider Last Rate Last Dose  . 0.9 %  sodium chloride infusion  500 mL Intravenous Continuous Ladene Artist, MD        Social History   Socioeconomic History  . Marital status: Married    Spouse name: Not on file  . Number of children: 0  . Years of education: Not on file  . Highest education level: Not on file  Social Needs  . Financial resource strain: Not on file  . Food insecurity - worry: Not on file  . Food insecurity - inability: Not on file  . Transportation needs - medical: Not on file  . Transportation needs - non-medical: Not on file  Occupational History  . Occupation: retired [employer]  Tobacco Use  . Smoking status: Never Smoker  . Smokeless tobacco: Never Used  Substance and Sexual Activity  . Alcohol use: Yes    Alcohol/week: 0.0 oz    Comment: occ.  . Drug use: No  . Sexual activity: Yes  Other Topics Concern  . Not on file  Social History Narrative  . Not on file   Socially he is married with no children. There is no tobacco use. He does drink occasional alcohol. He recently bought a home  in [place] has been spending time at the [facility] history is notable that both parents are alive, mother age 50 father age 16. He is a brother age 38 and a sister age 75. Maternal grandmother suffered an MI and died at 46  ROS General: Negative; No fevers, chills, or night sweats;  HEENT: Negative; No changes in vision or hearing, sinus congestion, difficulty swallowing Pulmonary: Negative; No cough, wheezing, shortness of breath, hemoptysis Cardiovascular: Negative; No chest pain, presyncope, syncope, palpitations GI: Negative; No nausea, vomiting, diarrhea, or abdominal pain GU: Negative; No dysuria, hematuria, or difficulty voiding Musculoskeletal: Negative; no myalgias, joint pain, or weakness Hematologic/Oncology: Negative; no easy bruising, bleeding Endocrine: Negative; no heat/cold intolerance; no diabetes Neuro: Transient right  lateral thigh paresthesias; no changes in balance, headaches Skin: Negative; No rashes or skin lesions Psychiatric: Negative; No behavioral problems, depression Sleep: Negative; No snoring, daytime sleepiness, hypersomnolence, bruxism, restless legs, hypnogognic hallucinations, no cataplexy Other comprehensive 14 point system review is negative.   PE BP 120/86   Pulse 65   Ht _0  (1.905 m)   Wt 260 lb (117.9 kg)   BMI 32.50 kg/m    Repeat blood pressure by me was 120/84  Wt Readings from Last 3 Encounters:  11/20/16 260 lb (117.9 kg)  04/08/16 256 lb (116.1 kg)  11/13/15 254 lb 12.8 oz (115.6 kg)  . General: Alert, oriented, no distress.  Skin: normal turgor, no rashes, warm and dry HEENT: Normocephalic, atraumatic. Pupils equal round and reactive to light; sclera anicteric; extraocular muscles intact;  Nose without nasal septal hypertrophy Mouth/Parynx  benign; Mallinpatti scale 2 Neck: No JVD, no carotid bruits; normal carotid upstroke Lungs: clear to ausculatation and percussion; no wheezing or rales Chest wall: without tenderness to palpitation Heart: PMI not displaced, RRR, s1 s2 normal, 1/6 systolic murmur in the aortic area consistent with his mild AS; no diastolic murmur, no rubs, gallops, thrills, or heaves Abdomen: soft, nontender; no hepatosplenomehaly, BS+; abdominal aorta nontender and not dilated by palpation. Back: no CVA tenderness Pulses 2+ Musculoskeletal: full range of motion, normal strength, no joint deformities Extremities: no clubbing cyanosis or edema, Homan's sign negative  Neurologic: grossly nonfocal; Cranial nerves grossly wnl Psychologic: Normal mood and affect   ECG (independently read by me): Normal sinus rhythm at 65 bpm.  First degree AV block.  QTc interval 426 ms.  PR interval 216 ms.  QS complex V1 through V3 consistent with prior anteroseptal MI.  November 2017 ECG (independently read by me): Sinus bradycardia with first-degree AV block.   Ventricular rate 57 bpm.  PR interval 214 ms.  QS complex consistent with prior anteroseptal MI.  September 2016 ECG (independently read by me): , sinus bradycardia 54 bpm.  Anterior Q waves V1 through V4 concordant with his old anterior MI.  Normal intervals.    August 2015 ECG (independently read by me): Sinus rhythm at 64 beats per minute.  Old anterior wall myocardial infarction.  QTc interval 429 ms. ,  Nonspecific T. change  02/24/2013 ECG (independently read by me): Sinus rhythm at 60 beats per minute. Anterior Q waves V1 through V4 compatible with his prior anterior wall myocardial infarction. QTc interval 470.  Prior ECG of 08/23/2012: Sinus rhythm at 66 beats per minute. Perineural 184 ms. Poor anterograde progression compatible with his old injury or myocardial infarction.  LABS: BMP Latest Ref Rng & Units 11/15/2015 08/25/2014 03/15/2013  Glucose 65 - 99 mg/dL 93 100(H) 100(H)  BUN 7 - 25 mg/dL _0 Creatinine 0.70 - 1.25 mg/dL 1.12 1.05 1.05  BUN/Creat Ratio 10 - 22 - 12 14  Sodium 135 - 146 mmol/L 138 141 140  Potassium 3.5 - 5.3 mmol/L 4.5 4.5 4.5  Chloride 98 - 110 mmol/L 105 101 101  CO2 20 - 31 mmol/L _1 Calcium 8.6 - 10.3 mg/dL 9.5 9.3 9.2   Hepatic Function Latest Ref Rng & Units 11/15/2015 08/25/2014 03/15/2013  Total Protein 6.1 - 8.1 g/dL 7.1 6.7 6.2  Albumin 3.6 - 5.1 g/dL 4.5 4.6 4.5  AST 10 - 35 U/L _2 ALT 9 - 46 U/L 23 30 45(H)  Alk Phosphatase 40 - 115 U/L 75 83 72  Total Bilirubin 0.2 - 1.2 mg/dL 1.0 0.5 0.7   CBC Latest Ref Rng & Units 11/15/2015 08/25/2014 03/15/2013  WBC 3.8 - 10.8 K/uL 7.4 6.8 7.3  Hemoglobin 13.2 - 17.1 g/dL 16.0 15.8 15.3  Hematocrit 38.5 - 50.0 % 47.0 45.1 44.2  Platelets 140 - 400 K/uL 191 172 167   Lab Results  Component Value Date   MCV 90.2 11/15/2015   MCV 90 08/25/2014   MCV 90 03/15/2013   Lab Results  Component Value Date   TSH 1.89 11/15/2015  No results found for: HGBA1C  Lipid Panel     Component  Value Date/Time   CHOL 129 11/15/2015 1043   CHOL 138 08/25/2014 0946   TRIG 137 11/15/2015 1043   HDL 44 11/15/2015 1043   HDL 49 08/25/2014 0946   CHOLHDL 2.9 11/15/2015 1043  VLDL 27 11/15/2015 1043   LDLCALC 58 11/15/2015 1043   LDLCALC 63 08/25/2014 0946   RADIOLOGY: No results found.  IMPRESSION:   1. Coronary artery disease involving native coronary artery of native heart without angina pectoris   2. Old MI (myocardial infarction)   3. Essential hypertension   4. Hyperlipidemia with target LDL less than 70   5. Medication management   6. Mild obesity     ASSESSMENT AND PLAN: Brandon Frederick is  a 61 year old white male who suffered a large anterior wall myocardial infarction in June 2004 at which time he  underwent stenting of his proximal occluded LAD. In June 2009 a new LAD stent was inserted beyond the initially placed stent and he also underwent stenting of his PLA branch. .  Subsequently, he has remained stable and has been without recurrent anginal symptomatology.  His blood pressure today is controlled on ramipril 2.5 mg and Toprol-XL 25 mg daily.  He continues to be on aspirin and Plavix and is tolerating this well without bleeding.  He has hyperlipidemia with target LDL less than 70 and is on atorvastatin 40 mg.  He has not had fasting laboratory done over the past year and this will be checked.  His LDL 1 year ago was 58.  His weight is 260 pounds with a BMI of 32.5.  Weight loss was recommended.  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  I will contact him regarding his blood work and if adjustments need to be made to his medical therapy.  As long as he remains stable I will see him in one-year for reevaluation.   Time spent: 25 minutes  Troy Sine, MD, [facility]  11/22/2016 6:45 PM

## 2016-11-20 NOTE — Patient Instructions (Signed)
Medication Instructions:  Your physician recommends that you continue on your current medications as directed. Please refer to the Current Medication list given to you today.  Labwork: Please return for FASTING labs (CMET, CBC, Lipid, TSH)-lab slips provided.  Follow-Up: Your physician wants you to follow-up in: 12 months with Dr. Claiborne Billings.  You will receive a reminder letter in the mail two months in advance. If you don't receive a letter, please call our office to schedule the follow-up appointment.   Any Other Special Instructions Will Be Listed Below (If Applicable).     If you need a refill on your cardiac medications before your next appointment, please call your pharmacy.

## 2016-11-22 ENCOUNTER — Encounter: Payer: Self-pay | Admitting: Cardiovascular Disease

## 2016-12-02 ENCOUNTER — Ambulatory Visit (INDEPENDENT_AMBULATORY_CARE_PROVIDER_SITE_OTHER): Payer: BLUE CROSS/BLUE SHIELD | Admitting: Family Medicine

## 2016-12-02 ENCOUNTER — Encounter: Payer: Self-pay | Admitting: Family Medicine

## 2016-12-02 VITALS — BP 118/90 | HR 64 | Temp 98.1°F | Wt 257.0 lb

## 2016-12-02 DIAGNOSIS — D1801 Hemangioma of skin and subcutaneous tissue: Secondary | ICD-10-CM | POA: Diagnosis not present

## 2016-12-02 DIAGNOSIS — M7041 Prepatellar bursitis, right knee: Secondary | ICD-10-CM | POA: Diagnosis not present

## 2016-12-02 DIAGNOSIS — L821 Other seborrheic keratosis: Secondary | ICD-10-CM | POA: Diagnosis not present

## 2016-12-02 DIAGNOSIS — D225 Melanocytic nevi of trunk: Secondary | ICD-10-CM | POA: Diagnosis not present

## 2016-12-02 DIAGNOSIS — I10 Essential (primary) hypertension: Secondary | ICD-10-CM | POA: Diagnosis not present

## 2016-12-02 DIAGNOSIS — Z85820 Personal history of malignant melanoma of skin: Secondary | ICD-10-CM | POA: Diagnosis not present

## 2016-12-02 DIAGNOSIS — I2510 Atherosclerotic heart disease of native coronary artery without angina pectoris: Secondary | ICD-10-CM | POA: Diagnosis not present

## 2016-12-02 DIAGNOSIS — E785 Hyperlipidemia, unspecified: Secondary | ICD-10-CM | POA: Diagnosis not present

## 2016-12-02 DIAGNOSIS — Z79899 Other long term (current) drug therapy: Secondary | ICD-10-CM | POA: Diagnosis not present

## 2016-12-02 NOTE — Progress Notes (Signed)
   Subjective:    Patient ID: Brandon Frederick, male    DOB: [birth year], 61 y.o.   MRN: [id number]  HPI Chief Complaint  Patient presents with  . knot on knee 2 weeks,  no injury.  painful if on knees   He is here with complaints of a "knot" that appeared below his right knee several months ago but for the past 2 weeks it seems to be getting larger.  No known injury. Non tender. Denies fever, chills, knee pain or range of motion issues.   Review of Systems Pertinent positives and negatives in the history of present illness.     Objective:   Physical Exam BP 118/90   Pulse 64   Temp 98.1 F (36.7 C) (Oral)   Wt 257 lb (116.6 kg)   SpO2 96%   BMI 32.12 kg/m   A non tender bony prominence of the tibial tubercle of right leg. No erythema.  Right knee without edema, effusion, normal ROM, strength.       Assessment & Plan:  Prepatellar bursitis of right knee  He is asymptomatic. Dr. Redmond School also examined. We will do watchful waiting. No intervention needed.

## 2016-12-03 LAB — LIPID PANEL
CHOL/HDL RATIO: 3.2 ratio (ref 0.0–5.0)
CHOLESTEROL TOTAL: 142 mg/dL (ref 100–199)
HDL: 44 mg/dL (ref >39)
LDL CALC: 54 mg/dL (ref 0–99)
TRIGLYCERIDES: 219 mg/dL — AB (ref 0–149)
VLDL CHOLESTEROL CAL: 44 mg/dL — AB (ref 5–40)

## 2016-12-03 LAB — CBC
HEMATOCRIT: 45.9 % (ref 37.5–51.0)
HEMOGLOBIN: 16 g/dL (ref 13.0–17.7)
MCH: 31.7 pg (ref 26.6–33.0)
MCHC: 34.9 g/dL (ref 31.5–35.7)
MCV: 91 fL (ref 79–97)
Platelets: 176 10*3/uL (ref 150–379)
RBC: 5.05 x10E6/uL (ref 4.14–5.80)
RDW: 14 % (ref 12.3–15.4)
WBC: 7.3 10*3/uL (ref 3.4–10.8)

## 2016-12-03 LAB — COMPREHENSIVE METABOLIC PANEL
A/G RATIO: 2.1 (ref 1.2–2.2)
ALT: 32 IU/L (ref 0–44)
AST: 20 IU/L (ref 0–40)
Albumin: 4.7 g/dL (ref 3.6–4.8)
Alkaline Phosphatase: 97 IU/L (ref 39–117)
BUN/Creatinine Ratio: 13 (ref 10–24)
BUN: 15 mg/dL (ref 8–27)
Bilirubin Total: 0.8 mg/dL (ref 0.0–1.2)
CALCIUM: 9.7 mg/dL (ref 8.6–10.2)
CO2: 24 mmol/L (ref 20–29)
CREATININE: 1.13 mg/dL (ref 0.76–1.27)
Chloride: 102 mmol/L (ref 96–106)
GFR, EST AFRICAN AMERICAN: 80 mL/min/{1.73_m2} (ref >59)
GFR, EST NON AFRICAN AMERICAN: 69 mL/min/{1.73_m2} (ref >59)
Globulin, Total: 2.2 g/dL (ref 1.5–4.5)
Glucose: 96 mg/dL (ref 65–99)
POTASSIUM: 4.7 mmol/L (ref 3.5–5.2)
Sodium: 140 mmol/L (ref 134–144)
TOTAL PROTEIN: 6.9 g/dL (ref 6.0–8.5)

## 2016-12-03 LAB — TSH: TSH: 2.8 u[IU]/mL (ref 0.450–4.500)

## 2016-12-13 ENCOUNTER — Encounter: Payer: Self-pay | Admitting: Cardiovascular Disease

## 2016-12-18 ENCOUNTER — Other Ambulatory Visit: Payer: Self-pay | Admitting: Cardiovascular Disease

## 2016-12-18 MED ORDER — ATORVASTATIN CALCIUM 40 MG PO TABS: 40.0000 mg | ORAL_TABLET | Freq: Every day | ORAL | 3 refills | Status: DC

## 2016-12-18 MED ORDER — CLOPIDOGREL BISULFATE 75 MG PO TABS: 75.0000 mg | ORAL_TABLET | Freq: Every day | ORAL | 3 refills | Status: DC

## 2016-12-18 MED ORDER — METOPROLOL SUCCINATE ER 25 MG PO TB24: 25.0000 mg | ORAL_TABLET | Freq: Every day | ORAL | 3 refills | Status: DC

## 2016-12-18 MED ORDER — RAMIPRIL 2.5 MG PO CAPS: 2.5000 mg | ORAL_CAPSULE | Freq: Two times a day (BID) | ORAL | 3 refills | Status: DC

## 2016-12-18 NOTE — Telephone Encounter (Signed)
°*  STAT* If patient is at the pharmacy, call can be transferred to refill team.   1. Which medications need to be refilled? (please list name of each medication and dose if known) Clopidogrel,Ramipril,Metoprololol and Atorvastatin 2. Which pharmacy/location (including street and city if local pharmacy) is medication to be sent to?[email or web address] [phone] Option 2  3. Do they need a 30 day or 90 day supply? 90 for all but theRamipril need 180 and refills

## 2017-01-06 HISTORY — PX: COLONOSCOPY: SHX174

## 2017-01-06 HISTORY — PX: FOOT FRACTURE SURGERY: SHX645

## 2017-08-04 ENCOUNTER — Ambulatory Visit (INDEPENDENT_AMBULATORY_CARE_PROVIDER_SITE_OTHER): Payer: BLUE CROSS/BLUE SHIELD

## 2017-08-04 ENCOUNTER — Ambulatory Visit (INDEPENDENT_AMBULATORY_CARE_PROVIDER_SITE_OTHER): Payer: BLUE CROSS/BLUE SHIELD | Admitting: Podiatry

## 2017-08-04 ENCOUNTER — Other Ambulatory Visit: Payer: Self-pay | Admitting: Podiatry

## 2017-08-04 ENCOUNTER — Encounter: Payer: Self-pay | Admitting: Podiatry

## 2017-08-04 DIAGNOSIS — M779 Enthesopathy, unspecified: Secondary | ICD-10-CM

## 2017-08-04 DIAGNOSIS — M7752 Other enthesopathy of left foot: Secondary | ICD-10-CM

## 2017-08-04 DIAGNOSIS — M778 Other enthesopathies, not elsewhere classified: Secondary | ICD-10-CM

## 2017-08-05 NOTE — Progress Notes (Signed)
He presents today for follow-up states that he has throbbing about the first metatarsophalangeal joint of the left foot.  States that is been going on for quite some time surgical foot is doing just fine still has a little bit of numbness in it.  Objective: Vital signs are stable he is alert and oriented x3.  Pulses are palpable.  He has good range of motion of the first metatarsophalangeal joint of the left foot though it is somewhat limited on sharp dorsiflexion mildly tender on palpation to the area.  Radiographs taken today demonstrate some early Oster arthritic changes no significant spurring.  Some joint space narrowing.  Assessment: Capsulitis first metatarsophalangeal joint left with mild bunion deformity.  Plan: Discussed etiology pathology and surgical therapies at this point time went ahead and performed a intra-articular injection with a 30-gauge half inch needle 10 mg of dexamethasone and local anesthetic after sterile Betadine skin prep.  Tolerated procedure well without complications follow-up with him as needed.

## 2017-08-13 ENCOUNTER — Other Ambulatory Visit: Payer: Self-pay

## 2017-08-13 ENCOUNTER — Telehealth: Payer: Self-pay | Admitting: Cardiovascular Disease

## 2017-08-13 MED ORDER — NITROGLYCERIN 0.4 MG SL SUBL: 0.4000 mg | SUBLINGUAL_TABLET | SUBLINGUAL | 3 refills | Status: DC | PRN

## 2017-08-13 NOTE — Telephone Encounter (Signed)
RX submitted to preferred pharmacy.

## 2017-08-13 NOTE — Telephone Encounter (Signed)
°*  STAT* If patient is at the pharmacy, call can be transferred to refill team.   1. Which medications need to be refilled? (please list name of each medication and dose if known) new prescription for Nitroglycerin  2. Which pharmacy/location (including street and city if local pharmacy) is medication to be sent to?[facility]  3. Do they need a 30 day or 90 day supply? 1 bottle

## 2017-09-09 ENCOUNTER — Ambulatory Visit (INDEPENDENT_AMBULATORY_CARE_PROVIDER_SITE_OTHER): Payer: BLUE CROSS/BLUE SHIELD | Admitting: Family Medicine

## 2017-09-09 ENCOUNTER — Encounter: Payer: Self-pay | Admitting: Family Medicine

## 2017-09-09 VITALS — BP 122/76 | HR 70 | Temp 98.1°F | Wt 258.6 lb

## 2017-09-09 DIAGNOSIS — Z23 Encounter for immunization: Secondary | ICD-10-CM | POA: Diagnosis not present

## 2017-09-09 DIAGNOSIS — E669 Obesity, unspecified: Secondary | ICD-10-CM

## 2017-09-09 DIAGNOSIS — I252 Old myocardial infarction: Secondary | ICD-10-CM

## 2017-09-09 DIAGNOSIS — E785 Hyperlipidemia, unspecified: Secondary | ICD-10-CM | POA: Diagnosis not present

## 2017-09-09 DIAGNOSIS — I10 Essential (primary) hypertension: Secondary | ICD-10-CM

## 2017-09-09 DIAGNOSIS — I2510 Atherosclerotic heart disease of native coronary artery without angina pectoris: Secondary | ICD-10-CM | POA: Diagnosis not present

## 2017-09-09 DIAGNOSIS — Z85820 Personal history of malignant melanoma of skin: Secondary | ICD-10-CM

## 2017-09-09 DIAGNOSIS — N400 Enlarged prostate without lower urinary tract symptoms: Secondary | ICD-10-CM | POA: Diagnosis not present

## 2017-09-09 MED ORDER — FINASTERIDE 5 MG PO TABS: 5.0000 mg | ORAL_TABLET | Freq: Every day | ORAL | 3 refills | Status: DC

## 2017-09-09 MED ORDER — RAMIPRIL 2.5 MG PO CAPS
2.5000 mg | ORAL_CAPSULE | Freq: Two times a day (BID) | ORAL | 3 refills | Status: DC
Start: 2017-09-09 — End: 2018-12-14

## 2017-09-09 MED ORDER — ATORVASTATIN CALCIUM 40 MG PO TABS: 40.0000 mg | ORAL_TABLET | Freq: Every day | ORAL | 3 refills | Status: DC

## 2017-09-09 MED ORDER — CLOPIDOGREL BISULFATE 75 MG PO TABS: 75.0000 mg | ORAL_TABLET | Freq: Every day | ORAL | 3 refills | Status: DC

## 2017-09-09 MED ORDER — METOPROLOL SUCCINATE ER 25 MG PO TB24: 25.0000 mg | ORAL_TABLET | Freq: Every day | ORAL | 3 refills | Status: DC

## 2017-09-09 NOTE — Progress Notes (Signed)
   Subjective:    Patient ID: Brandon Frederick, male    DOB: [birth year], 62 y.o.   MRN: [id number]  HPI He is here for an interval evaluation.  He does have underlying BPH and is taking finasteride.  He has no difficulty with that and does note that it does help with his BPH type symptoms.  He has had no chest pain, shortness of breath, PND.  He does follow-up regularly with Dr. Claiborne Billings.  Continues on Altace as well as Plavix, atorvastatin.  Has not used nitroglycerin in quite some time.  His weight is unchanged.  He has had some foot problems and states that that has interfered with his physical activity.  He does have a history of melanoma and does get yearly checks for that.   Review of Systems     Objective:   Physical Exam Alert and in no distress. Tympanic membranes and canals are normal. Pharyngeal area is normal. Neck is supple without adenopathy or thyromegaly. Cardiac exam shows a regular sinus rhythm without murmurs or gallops. Lungs are clear to auscultation.        Assessment & Plan:  ASHD (arteriosclerotic heart disease)  Benign prostatic hyperplasia without lower urinary tract symptoms  Hyperlipidemia with target LDL less than 70  Essential hypertension  Mild obesity  History of melanoma  Old MI (myocardial infarction) His medications were renewed.  Encouraged him to become more physically active.  He is interested in potentially switching to a different dermatologist.  I encouraged him to do that if he is uncomfortable with his previous one.  Otherwise he will return here in 1 year.

## 2017-11-27 ENCOUNTER — Telehealth: Payer: Self-pay

## 2017-11-27 NOTE — Telephone Encounter (Signed)
Pt called at 4:55 pm and is requesting a muscle relaxer due to back pain on his upper left side. Pt has taken on of his nitro and no relief. Please advise if just a few can be sent in to [facility] [address] for him and advise if appt is needed. [place]

## 2017-11-28 DIAGNOSIS — M549 Dorsalgia, unspecified: Secondary | ICD-10-CM | POA: Diagnosis not present

## 2017-11-29 NOTE — Telephone Encounter (Signed)
He needs an appointment

## 2017-11-30 ENCOUNTER — Telehealth: Payer: Self-pay | Admitting: Cardiovascular Disease

## 2017-11-30 ENCOUNTER — Inpatient Hospital Stay (HOSPITAL_COMMUNITY): Payer: BLUE CROSS/BLUE SHIELD

## 2017-11-30 ENCOUNTER — Inpatient Hospital Stay (HOSPITAL_COMMUNITY)
Admission: EM | Admit: 2017-11-30 | Discharge: 2017-12-01 | DRG: 247 | Disposition: A | Discharge status: Home / Self Care | Payer: BLUE CROSS/BLUE SHIELD | Attending: Interventional Cardiology | Admitting: Interventional Cardiology

## 2017-11-30 ENCOUNTER — Emergency Department (HOSPITAL_COMMUNITY): Payer: BLUE CROSS/BLUE SHIELD

## 2017-11-30 ENCOUNTER — Encounter (HOSPITAL_COMMUNITY)
Admission: EM | Disposition: A | Discharge status: Home / Self Care | Payer: Self-pay | Source: Home / Self Care | Attending: Interventional Cardiology

## 2017-11-30 ENCOUNTER — Encounter (HOSPITAL_COMMUNITY): Payer: Self-pay | Admitting: *Deleted

## 2017-11-30 DIAGNOSIS — Z8249 Family history of ischemic heart disease and other diseases of the circulatory system: Secondary | ICD-10-CM | POA: Diagnosis not present

## 2017-11-30 DIAGNOSIS — Z6832 Body mass index (BMI) 32.0-32.9, adult: Secondary | ICD-10-CM

## 2017-11-30 DIAGNOSIS — I700 Atherosclerosis of aorta: Secondary | ICD-10-CM | POA: Diagnosis present

## 2017-11-30 DIAGNOSIS — I25110 Atherosclerotic heart disease of native coronary artery with unstable angina pectoris: Principal | ICD-10-CM

## 2017-11-30 DIAGNOSIS — Z1211 Encounter for screening for malignant neoplasm of colon: Secondary | ICD-10-CM

## 2017-11-30 DIAGNOSIS — M546 Pain in thoracic spine: Secondary | ICD-10-CM | POA: Diagnosis not present

## 2017-11-30 DIAGNOSIS — I358 Other nonrheumatic aortic valve disorders: Secondary | ICD-10-CM | POA: Diagnosis present

## 2017-11-30 DIAGNOSIS — Z800 Family history of malignant neoplasm of digestive organs: Secondary | ICD-10-CM | POA: Diagnosis not present

## 2017-11-30 DIAGNOSIS — R918 Other nonspecific abnormal finding of lung field: Secondary | ICD-10-CM | POA: Diagnosis present

## 2017-11-30 DIAGNOSIS — I200 Unstable angina: Secondary | ICD-10-CM | POA: Diagnosis present

## 2017-11-30 DIAGNOSIS — Z79899 Other long term (current) drug therapy: Secondary | ICD-10-CM

## 2017-11-30 DIAGNOSIS — Z85820 Personal history of malignant melanoma of skin: Secondary | ICD-10-CM | POA: Diagnosis not present

## 2017-11-30 DIAGNOSIS — E669 Obesity, unspecified: Secondary | ICD-10-CM | POA: Diagnosis present

## 2017-11-30 DIAGNOSIS — I10 Essential (primary) hypertension: Secondary | ICD-10-CM | POA: Diagnosis present

## 2017-11-30 DIAGNOSIS — I252 Old myocardial infarction: Secondary | ICD-10-CM

## 2017-11-30 DIAGNOSIS — R5383 Other fatigue: Secondary | ICD-10-CM | POA: Diagnosis not present

## 2017-11-30 DIAGNOSIS — G8929 Other chronic pain: Secondary | ICD-10-CM | POA: Diagnosis not present

## 2017-11-30 DIAGNOSIS — R079 Chest pain, unspecified: Secondary | ICD-10-CM | POA: Diagnosis not present

## 2017-11-30 DIAGNOSIS — Z7902 Long term (current) use of antithrombotics/antiplatelets: Secondary | ICD-10-CM

## 2017-11-30 DIAGNOSIS — Z7982 Long term (current) use of aspirin: Secondary | ICD-10-CM

## 2017-11-30 DIAGNOSIS — I2541 Coronary artery aneurysm: Secondary | ICD-10-CM | POA: Diagnosis present

## 2017-11-30 DIAGNOSIS — E782 Mixed hyperlipidemia: Secondary | ICD-10-CM | POA: Diagnosis not present

## 2017-11-30 DIAGNOSIS — R0602 Shortness of breath: Secondary | ICD-10-CM | POA: Diagnosis not present

## 2017-11-30 DIAGNOSIS — R7989 Other specified abnormal findings of blood chemistry: Secondary | ICD-10-CM | POA: Diagnosis present

## 2017-11-30 DIAGNOSIS — I2510 Atherosclerotic heart disease of native coronary artery without angina pectoris: Secondary | ICD-10-CM

## 2017-11-30 DIAGNOSIS — Z955 Presence of coronary angioplasty implant and graft: Secondary | ICD-10-CM | POA: Diagnosis not present

## 2017-11-30 DIAGNOSIS — E785 Hyperlipidemia, unspecified: Secondary | ICD-10-CM | POA: Diagnosis present

## 2017-11-30 HISTORY — PX: CORONARY STENT INTERVENTION: CATH118234

## 2017-11-30 HISTORY — PX: LEFT HEART CATH AND CORONARY ANGIOGRAPHY: CATH118249

## 2017-11-30 LAB — BASIC METABOLIC PANEL
ANION GAP: 6 (ref 5–15)
BUN: 17 mg/dL (ref 8–23)
CO2: 27 mmol/L (ref 22–32)
Calcium: 9.6 mg/dL (ref 8.9–10.3)
Chloride: 107 mmol/L (ref 98–111)
Creatinine, Ser: 1.34 mg/dL — ABNORMAL HIGH (ref 0.61–1.24)
GFR calc Af Amer: >60 mL/min (ref >60)
GFR, EST NON AFRICAN AMERICAN: 55 mL/min — AB (ref >60)
GLUCOSE: 102 mg/dL — AB (ref 70–99)
POTASSIUM: 4 mmol/L (ref 3.5–5.1)
Sodium: 140 mmol/L (ref 135–145)

## 2017-11-30 LAB — CBC
HCT: 51.3 % (ref 39.0–52.0)
HEMOGLOBIN: 16.7 g/dL (ref 13.0–17.0)
MCH: 29.8 pg (ref 26.0–34.0)
MCHC: 32.6 g/dL (ref 30.0–36.0)
MCV: 91.4 fL (ref 80.0–100.0)
Platelets: 190 10*3/uL (ref 150–400)
RBC: 5.61 MIL/uL (ref 4.22–5.81)
RDW: 13 % (ref 11.5–15.5)
WBC: 7 10*3/uL (ref 4.0–10.5)
nRBC: 0 % (ref 0.0–0.2)

## 2017-11-30 LAB — HEPATIC FUNCTION PANEL
ALK PHOS: 67 U/L (ref 38–126)
ALT: 34 U/L (ref 0–44)
AST: 23 U/L (ref 15–41)
Albumin: 3.9 g/dL (ref 3.5–5.0)
BILIRUBIN DIRECT: 0.2 mg/dL (ref 0.0–0.2)
BILIRUBIN INDIRECT: 0.6 mg/dL (ref 0.3–0.9)
BILIRUBIN TOTAL: 0.8 mg/dL (ref 0.3–1.2)
Total Protein: 6.5 g/dL (ref 6.5–8.1)

## 2017-11-30 LAB — I-STAT TROPONIN, ED: TROPONIN I, POC: 0.04 ng/mL (ref 0.00–0.08)

## 2017-11-30 LAB — TROPONIN I

## 2017-11-30 LAB — TSH: TSH: 3.199 u[IU]/mL (ref 0.350–4.500)

## 2017-11-30 LAB — HEMOGLOBIN A1C
HEMOGLOBIN A1C: 5.6 % (ref 4.8–5.6)
Mean Plasma Glucose: 114.02 mg/dL

## 2017-11-30 LAB — SEDIMENTATION RATE: Sed Rate: 1 mm/hr (ref 0–16)

## 2017-11-30 LAB — MAGNESIUM: Magnesium: 2 mg/dL (ref 1.7–2.4)

## 2017-11-30 LAB — MRSA PCR SCREENING: MRSA by PCR: NEGATIVE

## 2017-11-30 SURGERY — LEFT HEART CATH AND CORONARY ANGIOGRAPHY
Anesthesia: LOCAL

## 2017-11-30 MED ORDER — SODIUM CHLORIDE 0.9 % IV SOLN
INTRAVENOUS | Status: AC | PRN
  Administered 2017-11-30: 250 mL via INTRAVENOUS

## 2017-11-30 MED ORDER — CLOPIDOGREL BISULFATE 75 MG PO TABS
75.0000 mg | ORAL_TABLET | Freq: Every day | ORAL | Status: DC
  Administered 2017-12-01: 75 mg via ORAL
  Filled 2017-11-30: qty 1

## 2017-11-30 MED ORDER — CLOPIDOGREL BISULFATE 75 MG PO TABS: 75.0000 mg | ORAL_TABLET | Freq: Every day | ORAL | Status: DC

## 2017-11-30 MED ORDER — ASPIRIN 81 MG PO CHEW: 81.0000 mg | CHEWABLE_TABLET | Freq: Every day | ORAL | Status: DC

## 2017-11-30 MED ORDER — HEPARIN (PORCINE) IN NACL 1000-0.9 UT/500ML-% IV SOLN
INTRAVENOUS | Status: AC
  Filled 2017-11-30: qty 1000

## 2017-11-30 MED ORDER — ONDANSETRON HCL 4 MG/2ML IJ SOLN: 4.0000 mg | Freq: Four times a day (QID) | INTRAMUSCULAR | Status: DC | PRN

## 2017-11-30 MED ORDER — SODIUM CHLORIDE 0.9 % IV SOLN: 250.0000 mL | INTRAVENOUS | Status: DC | PRN

## 2017-11-30 MED ORDER — LIDOCAINE HCL (PF) 1 % IJ SOLN
INTRAMUSCULAR | Status: DC | PRN
  Administered 2017-11-30: 2 mL

## 2017-11-30 MED ORDER — VERAPAMIL HCL 2.5 MG/ML IV SOLN
INTRAVENOUS | Status: DC | PRN
  Administered 2017-11-30: 10 mL via INTRA_ARTERIAL

## 2017-11-30 MED ORDER — ASPIRIN EC 81 MG PO TBEC: 81.0000 mg | DELAYED_RELEASE_TABLET | Freq: Every day | ORAL | Status: DC

## 2017-11-30 MED ORDER — IOPAMIDOL (ISOVUE-370) INJECTION 76%
INTRAVENOUS | Status: AC
  Filled 2017-11-30: qty 100

## 2017-11-30 MED ORDER — FENTANYL CITRATE (PF) 100 MCG/2ML IJ SOLN
INTRAMUSCULAR | Status: DC | PRN
  Administered 2017-11-30: 25 ug via INTRAVENOUS

## 2017-11-30 MED ORDER — ATORVASTATIN CALCIUM 80 MG PO TABS
80.0000 mg | ORAL_TABLET | Freq: Every day | ORAL | Status: DC
  Administered 2017-11-30: 80 mg via ORAL
  Filled 2017-11-30: qty 4
  Filled 2017-11-30: qty 1

## 2017-11-30 MED ORDER — HEPARIN (PORCINE) IN NACL 1000-0.9 UT/500ML-% IV SOLN
INTRAVENOUS | Status: DC | PRN
  Administered 2017-11-30 (×2): 500 mL

## 2017-11-30 MED ORDER — SODIUM CHLORIDE 0.9 % IV SOLN: INTRAVENOUS | Status: DC

## 2017-11-30 MED ORDER — FENTANYL CITRATE (PF) 100 MCG/2ML IJ SOLN
INTRAMUSCULAR | Status: AC
  Filled 2017-11-30: qty 2

## 2017-11-30 MED ORDER — ASPIRIN 81 MG PO TABS: 81.0000 mg | ORAL_TABLET | ORAL | Status: DC

## 2017-11-30 MED ORDER — MIDAZOLAM HCL 2 MG/2ML IJ SOLN
INTRAMUSCULAR | Status: AC
  Filled 2017-11-30: qty 2

## 2017-11-30 MED ORDER — THE SENSUOUS HEART BOOK
Freq: Once | Status: AC
  Administered 2017-11-30: 21:00:00
  Filled 2017-11-30: qty 1

## 2017-11-30 MED ORDER — FINASTERIDE 5 MG PO TABS
5.0000 mg | ORAL_TABLET | Freq: Every day | ORAL | Status: DC
  Administered 2017-12-01: 5 mg via ORAL
  Filled 2017-11-30: qty 1

## 2017-11-30 MED ORDER — HEPARIN (PORCINE) 25000 UT/250ML-% IV SOLN
1300.0000 [IU]/h | INTRAVENOUS | Status: DC
  Administered 2017-11-30: 1300 [IU]/h via INTRAVENOUS
  Filled 2017-11-30: qty 250

## 2017-11-30 MED ORDER — NITROGLYCERIN 1 MG/10 ML FOR IR/CATH LAB
INTRA_ARTERIAL | Status: DC | PRN
  Administered 2017-11-30: 100 ug via INTRACORONARY

## 2017-11-30 MED ORDER — ASPIRIN 81 MG PO CHEW
324.0000 mg | CHEWABLE_TABLET | Freq: Once | ORAL | Status: AC
  Administered 2017-11-30: 324 mg via ORAL
  Filled 2017-11-30: qty 4

## 2017-11-30 MED ORDER — ACETAMINOPHEN 325 MG PO TABS
650.0000 mg | ORAL_TABLET | ORAL | Status: DC | PRN
  Filled 2017-11-30 (×2): qty 2

## 2017-11-30 MED ORDER — SODIUM CHLORIDE 0.9 % IV SOLN: 500.0000 mL | INTRAVENOUS | Status: DC

## 2017-11-30 MED ORDER — SODIUM CHLORIDE 0.9% FLUSH: 3.0000 mL | INTRAVENOUS | Status: DC | PRN

## 2017-11-30 MED ORDER — NITROGLYCERIN 0.4 MG SL SUBL: 0.4000 mg | SUBLINGUAL_TABLET | SUBLINGUAL | Status: DC | PRN

## 2017-11-30 MED ORDER — LIDOCAINE HCL (PF) 1 % IJ SOLN
INTRAMUSCULAR | Status: AC
  Filled 2017-11-30: qty 30

## 2017-11-30 MED ORDER — HEPARIN SODIUM (PORCINE) 1000 UNIT/ML IJ SOLN
INTRAMUSCULAR | Status: AC
  Filled 2017-11-30: qty 1

## 2017-11-30 MED ORDER — ALPRAZOLAM 0.25 MG PO TABS
0.2500 mg | ORAL_TABLET | Freq: Two times a day (BID) | ORAL | Status: DC | PRN
  Administered 2017-11-30 – 2017-12-01 (×2): 0.25 mg via ORAL
  Filled 2017-11-30 (×2): qty 1

## 2017-11-30 MED ORDER — MIDAZOLAM HCL 2 MG/2ML IJ SOLN
INTRAMUSCULAR | Status: DC | PRN
  Administered 2017-11-30: 1 mg via INTRAVENOUS

## 2017-11-30 MED ORDER — IOPAMIDOL (ISOVUE-370) INJECTION 76%
100.0000 mL | Freq: Once | INTRAVENOUS | Status: AC | PRN
  Administered 2017-11-30: 100 mL via INTRAVENOUS

## 2017-11-30 MED ORDER — SODIUM CHLORIDE 0.9 % IV SOLN
INTRAVENOUS | Status: DC
  Administered 2017-11-30: 14:00:00 via INTRAVENOUS

## 2017-11-30 MED ORDER — SODIUM CHLORIDE 0.9 % IV SOLN
INTRAVENOUS | Status: AC
  Administered 2017-11-30: 17:00:00 via INTRAVENOUS

## 2017-11-30 MED ORDER — ANGIOPLASTY BOOK
Freq: Once | Status: AC
  Administered 2017-11-30: 21:00:00
  Filled 2017-11-30: qty 1

## 2017-11-30 MED ORDER — ACETAMINOPHEN 325 MG PO TABS
650.0000 mg | ORAL_TABLET | ORAL | Status: DC | PRN
  Administered 2017-11-30 – 2017-12-01 (×2): 650 mg via ORAL

## 2017-11-30 MED ORDER — IOHEXOL 350 MG/ML SOLN
INTRAVENOUS | Status: DC | PRN
  Administered 2017-11-30: 135 mL via INTRA_ARTERIAL

## 2017-11-30 MED ORDER — NITROGLYCERIN IN D5W 200-5 MCG/ML-% IV SOLN: 0.0000 ug/min | INTRAVENOUS | Status: DC

## 2017-11-30 MED ORDER — NITROGLYCERIN IN D5W 200-5 MCG/ML-% IV SOLN
0.0000 ug/min | INTRAVENOUS | Status: DC
  Administered 2017-11-30: 5 ug/min via INTRAVENOUS
  Administered 2017-11-30: 15 ug/min via INTRAVENOUS
  Filled 2017-11-30: qty 250

## 2017-11-30 MED ORDER — HEPARIN BOLUS VIA INFUSION
4000.0000 [IU] | Freq: Once | INTRAVENOUS | Status: AC
  Administered 2017-11-30: 4000 [IU] via INTRAVENOUS
  Filled 2017-11-30: qty 4000

## 2017-11-30 MED ORDER — ASPIRIN 81 MG PO CHEW: 81.0000 mg | CHEWABLE_TABLET | ORAL | Status: DC

## 2017-11-30 MED ORDER — SODIUM CHLORIDE 0.9% FLUSH: 3.0000 mL | Freq: Two times a day (BID) | INTRAVENOUS | Status: DC

## 2017-11-30 MED ORDER — VERAPAMIL HCL 2.5 MG/ML IV SOLN
INTRAVENOUS | Status: AC
  Filled 2017-11-30: qty 2

## 2017-11-30 MED ORDER — ZOLPIDEM TARTRATE 5 MG PO TABS
5.0000 mg | ORAL_TABLET | Freq: Every evening | ORAL | Status: DC | PRN
  Administered 2017-11-30: 5 mg via ORAL
  Filled 2017-11-30: qty 1

## 2017-11-30 MED ORDER — HYDRALAZINE HCL 20 MG/ML IJ SOLN: 5.0000 mg | INTRAMUSCULAR | Status: AC | PRN

## 2017-11-30 MED ORDER — SODIUM CHLORIDE 0.9% FLUSH
3.0000 mL | Freq: Two times a day (BID) | INTRAVENOUS | Status: DC
  Administered 2017-11-30 (×2): 3 mL via INTRAVENOUS

## 2017-11-30 MED ORDER — MORPHINE SULFATE (PF) 2 MG/ML IV SOLN
2.0000 mg | INTRAVENOUS | Status: DC | PRN
  Administered 2017-11-30 (×2): 2 mg via INTRAVENOUS
  Filled 2017-11-30 (×2): qty 1

## 2017-11-30 MED ORDER — NITROGLYCERIN 1 MG/10 ML FOR IR/CATH LAB
INTRA_ARTERIAL | Status: AC
  Filled 2017-11-30: qty 10

## 2017-11-30 MED ORDER — METOPROLOL SUCCINATE ER 25 MG PO TB24
25.0000 mg | ORAL_TABLET | Freq: Every day | ORAL | Status: DC
  Administered 2017-12-01: 25 mg via ORAL
  Filled 2017-11-30: qty 1

## 2017-11-30 MED ORDER — HEPARIN SODIUM (PORCINE) 1000 UNIT/ML IJ SOLN
INTRAMUSCULAR | Status: DC | PRN
  Administered 2017-11-30: 6000 [IU] via INTRAVENOUS
  Administered 2017-11-30: 5000 [IU] via INTRAVENOUS

## 2017-11-30 MED ORDER — LABETALOL HCL 5 MG/ML IV SOLN: 10.0000 mg | INTRAVENOUS | Status: AC | PRN

## 2017-11-30 SURGICAL SUPPLY — 18 items
BALLN SAPPHIRE 2.5X12 (BALLOONS) ×2
BALLOON SAPPHIRE 2.5X12 (BALLOONS) IMPLANT
CATH 5FR JL3.5 JR4 ANG PIG MP (CATHETERS) ×1 IMPLANT
CATH LAUNCHER 6FR EBU3.5 (CATHETERS) ×1 IMPLANT
DEVICE RAD COMP TR BAND LRG (VASCULAR PRODUCTS) ×1 IMPLANT
ELECT DEFIB PAD ADLT CADENCE (PAD) ×1 IMPLANT
GLIDESHEATH SLEND SS 6F .021 (SHEATH) ×1 IMPLANT
GUIDEWIRE INQWIRE 1.5J.035X260 (WIRE) IMPLANT
INQWIRE 1.5J .035X260CM (WIRE) ×2
KIT ENCORE 26 ADVANTAGE (KITS) ×1 IMPLANT
KIT HEART LEFT (KITS) ×2 IMPLANT
KIT HEMO VALVE WATCHDOG (MISCELLANEOUS) ×1 IMPLANT
PACK CARDIAC CATHETERIZATION (CUSTOM PROCEDURE TRAY) ×2 IMPLANT
STENT SYNERGY DES 3X16 (Permanent Stent) ×1 IMPLANT
TRANSDUCER W/STOPCOCK (MISCELLANEOUS) ×2 IMPLANT
TUBING CIL FLEX 10 FLL-RA (TUBING) ×2 IMPLANT
WIRE ASAHI PROWATER 180CM (WIRE) ×1 IMPLANT
WIRE HI TORQ BMW 190CM (WIRE) ×1 IMPLANT

## 2017-11-30 NOTE — Interval H&P Note (Signed)
Cath Lab Visit (complete for each Cath Lab visit)  Clinical Evaluation Leading to the Procedure:   ACS: Yes.    Non-ACS:    Anginal Classification: CCS IV  Anti-ischemic medical therapy: Minimal Therapy (1 class of medications)  Non-Invasive Test Results: No non-invasive testing performed  Prior CABG: No previous CABG  Recurrent pain.  Hypotension.  Brought to the cath lab emergently.     History and Physical Interval Note:  11/30/2017 2:46 PM  Brandon Frederick  has presented today for surgery, with the diagnosis of ua  The various methods of treatment have been discussed with the patient and family. After consideration of risks, benefits and other options for treatment, the patient has consented to  Procedure(s): LEFT HEART CATH AND CORONARY ANGIOGRAPHY (N/A) as a surgical intervention .  The patient's history has been reviewed, patient examined, no change in status, stable for surgery.  I have reviewed the patient's chart and labs.  Questions were answered to the patient's satisfaction.     Larae Grooms

## 2017-11-30 NOTE — Significant Event (Signed)
Rapid Response Event Note  Overview:  Called to assist with patient with [place] Time Called: 1422 Arrival Time: 1425 Event Type: Cardiac  Initial Focused Assessment: Patient alert and oriented - pale - diaphoretic - c/o 8/10 pain between shoulder blades just like his pain with last MI requiring stent placement.  BP has dropped 62/ 46 - advised RN Stephannie with phone call to start NS bolus - NTG was turned off - SB on monitor - 12 lead without changes from earlier today in ED.  Stephannie RN on phone with Dr. Christ Kick.  O2 at 2 liters.  Lungs clear.     Interventions:  NS bolus - BP responding 91/63 .  Patient denies nausea or SOB - but is clearly uncomfortable -pain remains the same.    Zoll monitor to bedside.  IV's patent.  Transferred stat to cath lab without incident.  Handoff to cath lab team.  Wife supported.    Plan of Care (if not transferred):  Event Summary: Name of Physician Notified: Dr.  Christ Kick at (pta RRT)    at    Outcome: Transferred (Comment)(to cath lab stat)  Event End Time: [place]  Quin Hoop

## 2017-11-30 NOTE — ED Triage Notes (Signed)
Pt in c/o mid upper back pain onset x 7 days intermittently, pain worsened x 3 days ago, pt reports pain worse with exertion, pt seen at clinic for symptoms Saturday and was given Flexeril and DicloFENAC, pt told by Cardiologist Claiborne Billings, MD to come in, pt hx of x 3 MI with stent placement, pt denies SOB, n/v/d

## 2017-11-30 NOTE — Telephone Encounter (Signed)
New Message          Pt c/o of Chest Pain: STAT if CP now or developed within 24 hours  1. Are you having CP right now? Yes  2. Are you experiencing any other symptoms (ex. SOB, nausea, vomiting, sweating)? No   3. How long have you been experiencing CP? Week  4. Is your CP continuous or coming and going? iBoth   5. Have you taken Nitroglycerin? Yes ?

## 2017-11-30 NOTE — Telephone Encounter (Signed)
Patient admitted to the hospital and has undergone cardiac catheterization today with insertion of DES stent to the proximal circumflex vessel

## 2017-11-30 NOTE — Telephone Encounter (Signed)
Triage call received from operator.  Spoke with pt. Pt sts that he has been having an ongoing pain between his shoulder blades since last week. Pt sts that the pain is sharp and stabbing, he describes the pain as similar to the pain he had with his previous heart attacks. Pt sts that he has pain intermittently when he over exerts him self, the pain resolves with rest, and is sometimes relieved with 1 nitro. The pain he is currently having occurs with and without exertion.  Pt sts that the pain this time is ongoing, he was seen by a local Urgentcare on Saturday, a EKG was performed. Pt sts that he was told that the EKG was abnormal but there were no previous ones to compare. He was prescribed muscle relaxers which have not been effective for the pain.  Pt sts that he is currently having the sharp pain between his muscle blades today is day 5. Pt sts that he did take 1 Nitro on Fri that did not relieve the pain. He has not used Nitro since.   Adv pt that based on his previous cardiac history, the pain being similar to the pain he had during his previous MI, and that the pain is now occurring at rest. Pt should go to the ED for evaluation.  Pt is agreeable and will have his wife drive him to [facility] ED. Adv pt that I will fwd an update to Dr.Kelly.

## 2017-11-30 NOTE — ED Provider Notes (Signed)
[facility] EMERGENCY DEPARTMENT Provider Note   CSN: [id number] Arrival date & time: 11/30/17  7412     History   Chief Complaint Chief Complaint  Patient presents with  . Chest Pain    HPI Brandon Frederick is a 62 y.o. male.  62 year old male with prior history of MI x3 with stent placement presents with complaint of pain in his left upper back, similar to his second MI.  Patient states that he has back pain with exertion which generally improves with rest, states for the past week this pain has been constant, sharp compared to his usual dull pain, does not improve with rest.  Patient went to his PCP office on Friday, was told that his EKG was not normal however they did not have a prior to compare to and due to history of prior MI did not suspect this to be an acute change.  Patient was given prescriptions for diclofenac and Flexeril and advised to present to the ER if his pain got worse.  Patient states pain was worse last night, called his providers office this morning and was advised to come to the emergency room.  Patient has taken his nitro without relief of his pain, states he took 1 dose, did not take any subsequent doses.  Reports fatigue, has SHOB at his baseline, denies nausea, vomiting, diaphoresis, abdominal pain. Patient is on Plavix, takes 81 mg of aspirin every other day, unsure if he is taking his aspirin today as they are in a pillbox.  Patient is on medication for cholesterol and blood pressure control however denies history of problems, states he is on these medications preventatively due to his heart history.  Patient is a non-smoker, no history of diabetes. Family history MI father with bypass x 5.      Past Medical History:  Diagnosis Date  . ASHD (arteriosclerotic heart disease)    stent  . Dyslipidemia   . History of stress test 09/2011   Which showed his previously documented scarring anteriorly and apically, concordant with his LAD  infarction. Post-stress EF 40%  . Hx of echocardiogram    showed an EF of approximately 40% with moderate-to-severe anterior wall hypokenisis and moderate-to-severe apical wall hypokinesis. He did have mild aortic sclerosis. There was mild pulmonary hypertensin with estimated RV systolic pressure of 34 mm.  . Hyperlipidemia   . Melanoma ([place])    torso  . MI (myocardial infarction) ([place]) 2004, 2009   Large Anterior wall, at which he underwent stenting of his promixal occluded LAD with ultimate insertion of a 3.5 x 28 mm Cypher stent post dilated to 4.1 cm.    Patient Active Problem List   Diagnosis Date Noted  . Unstable angina ([place]) 11/30/2017  . Benign prostatic hyperplasia without lower urinary tract symptoms 09/09/2017  . Mild obesity 09/26/2014  . Essential hypertension 02/26/2013  . Hyperlipidemia with target LDL less than 70 08/23/2012  . Old MI (myocardial infarction) 08/23/2012  . History of melanoma 07/28/2011  . ASHD (arteriosclerotic heart disease) 07/28/2011    Past Surgical History:  Procedure Laterality Date  . CARDIAC CATHETERIZATION  06/2007    revealed a progressive disease in the mid LAD, and he underwent insertion of a new mid-LAD stents as well stenting of his PLA branch of his right coronary artery with a 2.75 x 30 mm Cypher stent post dilated 3.25 mm. He did also have some mild additional concomitant CAD.  Marland Kitchen COLONOSCOPY    . CORONARY  ANGIOPLASTY WITH STENT PLACEMENT          Home Medications    Prior to Admission medications   Medication Sig Start Date End Date Taking? Authorizing Provider  aspirin 81 MG tablet Take 81 mg by mouth every other day.    Yes [provider]  atorvastatin (LIPITOR) 40 MG tablet Take 1 tablet (40 mg total) by mouth daily. 09/09/17  Yes Denita Lung, MD  clopidogrel (PLAVIX) 75 MG tablet Take 1 tablet (75 mg total) by mouth daily. 09/09/17  Yes Denita Lung, MD  cyclobenzaprine (FLEXERIL) 10 MG tablet Take 10 mg by  mouth 3 (three) times daily. 11/28/17  Yes [provider]  diclofenac (VOLTAREN) 25 MG EC tablet Take 25 mg by mouth 3 (three) times daily. 11/28/17  Yes [provider]  finasteride (PROSCAR) 5 MG tablet Take 1 tablet (5 mg total) by mouth daily. 09/09/17  Yes Denita Lung, MD  metoprolol succinate (TOPROL-XL) 25 MG 24 hr tablet Take 1 tablet (25 mg total) by mouth daily. 09/09/17  Yes Denita Lung, MD  nitroGLYCERIN (NITROSTAT) 0.4 MG SL tablet Place 1 tablet (0.4 mg total) under the tongue every 5 (five) minutes as needed for chest pain. 08/13/17  Yes Troy Sine, MD  ramipril (ALTACE) 2.5 MG capsule Take 1 capsule (2.5 mg total) by mouth 2 (two) times daily. 09/09/17  Yes Denita Lung, MD    Family History Family History  Problem Relation Age of Onset  . Heart disease Father        5 way bypass  . Colon cancer Maternal Grandfather        dx in his 95's  . Crohn's disease Brother     Social History Social History   Tobacco Use  . Smoking status: Never Smoker  . Smokeless tobacco: Never Used  Substance Use Topics  . Alcohol use: Yes    Alcohol/week: 0.0 standard drinks    Comment: occ.  . Drug use: No     Allergies   Patient has no known allergies.   Review of Systems Review of Systems  Constitutional: Positive for fatigue. Negative for chills, diaphoresis and fever.  Respiratory: Positive for shortness of breath. Negative for chest tightness.   Cardiovascular: Negative for chest pain, palpitations and leg swelling.  Gastrointestinal: Negative for abdominal pain, nausea and vomiting.  Genitourinary: Negative for difficulty urinating.  Musculoskeletal: Positive for back pain. Negative for arthralgias, myalgias, neck pain and neck stiffness.  Skin: Negative for rash and wound.  Allergic/Immunologic: Negative for immunocompromised state.  Hematological: Does not bruise/bleed easily.  Psychiatric/Behavioral: Negative for confusion.  All other  systems reviewed and are negative.    Physical Exam Updated Vital Signs BP (!) 124/92   Pulse (!) 55   Temp 98.3 F (36.8 C) (Oral)   Resp 14   SpO2 95%   Physical Exam  Constitutional: He is oriented to person, place, and time. He appears well-developed and well-nourished. No distress.  HENT:  Head: Normocephalic and atraumatic.  Cardiovascular: Normal rate, regular rhythm, intact distal pulses and normal pulses.  No murmur heard. Pulmonary/Chest: Effort normal and breath sounds normal. He has no decreased breath sounds. He exhibits no bony tenderness.  Abdominal: Soft. There is no tenderness.  Musculoskeletal:       Thoracic back: He exhibits no tenderness and no bony tenderness.       Right lower leg: He exhibits no tenderness and no edema.  Left lower leg: He exhibits no tenderness and no edema.  Neurological: He is alert and oriented to person, place, and time.  Skin: Skin is warm and dry. He is not diaphoretic.  Psychiatric: He has a normal mood and affect. His behavior is normal.  Nursing note and vitals reviewed.    ED Treatments / Results  Labs (all labs ordered are listed, but only abnormal results are displayed) Labs Reviewed  BASIC METABOLIC PANEL - Abnormal; Notable for the following components:      Result Value   Glucose, Bld 102 (*)    Creatinine, Ser 1.34 (*)    GFR calc non Af Amer 55 (*)    All other components within normal limits  CBC  TROPONIN I  TROPONIN I  TROPONIN I  MAGNESIUM  TSH  SEDIMENTATION RATE  HEPATIC FUNCTION PANEL  HEPARIN LEVEL (UNFRACTIONATED)  I-STAT TROPONIN, ED    EKG None  Radiology Dg Chest 2 View  Result Date: 11/30/2017 CLINICAL DATA:  Chest pain EXAM: CHEST - 2 VIEW COMPARISON:  June 07, 2007 FINDINGS: There is apparent nipple shadow on the left. No edema or consolidation. Heart size and pulmonary vascularity are normal. No adenopathy. No bone lesions preop coronary stent noted in the left anterior  descending coronary artery region. IMPRESSION: Apparent nipple shadow on the left; repeat study with nipple markers advised to confirm. No edema or consolidation. Stable cardiac silhouette. Electronically Signed   By: Lowella Grip III M.D.   On: 11/30/2017 09:43   Ct Angio Chest Aorta W/cm &/or Wo/cm  Result Date: 11/30/2017 CLINICAL DATA:  Mid upper back pain. EXAM: CT ANGIOGRAPHY CHEST WITH CONTRAST TECHNIQUE: Multidetector CT imaging of the chest was performed using the standard protocol during bolus administration of intravenous contrast. Multiplanar CT image reconstructions and MIPs were obtained to evaluate the vascular anatomy. CONTRAST:  131mL ISOVUE-370 IOPAMIDOL (ISOVUE-370) INJECTION 76% COMPARISON:  Chest x-ray dated 11/30/2017 FINDINGS: Cardiovascular: No evidence of pulmonary emboli. Multiple stents in the left coronary artery. There appears to be an area of moderate narrowing of the left anterior descending coronary artery on images 67 and 68. Heart size is normal.  No pericardial effusion. Mediastinum/Nodes: No enlarged mediastinal, hilar, or axillary lymph nodes. Thyroid gland, trachea, and esophagus demonstrate no significant findings. Lungs/Pleura: There is a 4 mm nodule in the anterior aspect of the right middle lobe on image 90 of series 6. 2 mm nodule in the periphery of the right upper lobe posterolaterally on image 56 of series 6. No infiltrates or effusions. Upper Abdomen: Negative. Musculoskeletal: No chest wall abnormality. No acute or significant osseous findings. Review of the MIP images confirms the above findings. IMPRESSION: 1. No pulmonary emboli. 2. There appears to be an area of moderate narrowing of the left anterior descending coronary artery as described above. 3. 4 mm nodule in the anterior aspect of the right middle lobe. No follow-up needed if patient is low-risk. Non-contrast chest CT can be considered in 12 months if patient is high-risk. This recommendation  follows the consensus statement: Guidelines for Management of Incidental Pulmonary Nodules Detected on CT Images: From the Fleischner Society 2017; Radiology 2017; 284:228-243. Electronically Signed   By: Lorriane Shire M.D.   On: 11/30/2017 13:11    Procedures Procedures (including critical care time)  Medications Ordered in ED Medications  iopamidol (ISOVUE-370) 76 % injection (has no administration in time range)  nitroGLYCERIN 50 mg in dextrose 5 % 250 mL (0.2 mg/mL) infusion (has no administration  in time range)  clopidogrel (PLAVIX) tablet 75 mg (has no administration in time range)  sodium chloride flush (NS) 0.9 % injection 3 mL (has no administration in time range)  sodium chloride flush (NS) 0.9 % injection 3 mL (has no administration in time range)  0.9 %  sodium chloride infusion (has no administration in time range)  aspirin chewable tablet 81 mg (has no administration in time range)  0.9 %  sodium chloride infusion (has no administration in time range)  morphine 2 MG/ML injection 2 mg (has no administration in time range)  heparin bolus via infusion 4,000 Units (has no administration in time range)  heparin ADULT infusion 100 units/mL (25000 units/2106mL sodium chloride 0.45%) (has no administration in time range)  aspirin chewable tablet 324 mg (324 mg Oral Given 11/30/17 1042)  iopamidol (ISOVUE-370) 76 % injection 100 mL (100 mLs Intravenous Contrast Given 11/30/17 1251)     Initial Impression / Assessment and Plan / ED Course  I have reviewed the triage vital signs and the nursing notes.  Pertinent labs & imaging results that were available during my care of the patient were reviewed by me and considered in my medical decision making (see chart for details).  Clinical Course as of Nov 30 1316  Mon Nov 30, 2017  1114 61yo male with complaint of pain in his left upper back, along the inferior medial scapula, not worse with movement of palpation. States he has pain in  this area with exertion and resolves with rest at baseline however states pain has been constant and not improving with rest for the past week. Went to UC and was told his EKG was abnormal but no baseline for comparison and given diclofenac and flexeril without relief. Patient tried nitro x 1 at home without relief. Pain was worse last night. Associated with fatigue. States pain is similar to his second Mi, called his cardiologist Dr. Claiborne Billings who is out of the office and was advised to come to the Er. EKG without significant changes, CXR unremarkable, CBC and trop x 1 WNL/negative. BMP with slight increase in Cr aat 1.34. Discussed with Dr. Lita Mains, ER attending recommends call to cards master,    [LM]  [phone] Cardiology has seen the patient and will admit. CTA negative for dissection.    [LM]    Clinical Course User Index [LM] Tacy Learn, PA-C   Final Clinical Impressions(s) / ED Diagnoses   Final diagnoses:  Acute left-sided thoracic back pain    ED Discharge Orders    None       Tacy Learn, PA-C 11/30/17 1318    Julianne Rice, MD 12/01/17 2257

## 2017-11-30 NOTE — H&P (Addendum)
Cardiology Admission History and Physical:   Patient ID: Brandon Frederick MRN: [id number]; DOB: [birth year]   Admission date: 11/30/2017  Primary Care Provider: Denita Lung, MD Primary Cardiologist: Shelva Majestic, MD  Primary Electrophysiologist:  None   Chief Complaint:  Mid upper back pain (subscapular pain) previous anginal equivalent   Patient Profile:   Brandon Frederick is a 62 y.o. male with hx of Ant MI 06/2002, with PCI,  Then 2009, PCI to LAD, and stent to PLA of RCA,  HLD, HTN now presents with 7 days of mid upper back pain, anginal equivalent.   History of Present Illness:   Brandon Frederick with above hx of MI, HTN, HLD and last cath 2009 with stent to mLAD, (in stent instenosis) and 90% PLA stenosis with stent.  Echo with EF 40% in 2011 and mild aortic valve sclerosis and last nuc 2013, with distal apical anterior scar without ischemia.    Today he presents to ER with upper back pain (subscapular pain).  Has had for 7 days.  Has been seen at an urgent care and muscle relaxer did not help.  Today he called the office and was asked to come to ER.  He did take a NTG without relief to mild improvement.  The muscle relaxant did not help.    Pain comes and goes, no associated nausea, diaphoreis or SOB  The pain has increased over last 2-3 days.  Currently pain is sharp back pain, no increase with palpation or movement.  He tells me this feels just like his heart attack.    Here in ER  EKG I personally reviewed with SB at 57 and no acute changes from 2017.  + Q waves in ant leads.  Na 140, K+ 4.0, CL 107, Cr 1.34,  Troponin 0.04 Hgb 16.7, Plts 190, WBC 7  CXR 2 View :  Apparent nipple shadow on the left; repeat study with nipple markers advised to confirm. No edema or consolidation. Stable cardiac silhouette  Now to have CTA of chest.  Adding IV NTG, pt is uncomfortable will also add IV morphine.  Will need IV heparin as well.   Past Medical History:  Diagnosis Date  . ASHD  (arteriosclerotic heart disease)    stent  . Dyslipidemia   . History of stress test 09/2011   Which showed his previously documented scarring anteriorly and apically, concordant with his LAD infarction. Post-stress EF 40%  . Hx of echocardiogram    showed an EF of approximately 40% with moderate-to-severe anterior wall hypokenisis and moderate-to-severe apical wall hypokinesis. He did have mild aortic sclerosis. There was mild pulmonary hypertensin with estimated RV systolic pressure of 34 mm.  . Hyperlipidemia   . Melanoma ([place])    torso  . MI (myocardial infarction) ([place]) 2004, 2009   Large Anterior wall, at which he underwent stenting of his promixal occluded LAD with ultimate insertion of a 3.5 x 28 mm Cypher stent post dilated to 4.1 cm.    Past Surgical History:  Procedure Laterality Date  . CARDIAC CATHETERIZATION  06/2007    revealed a progressive disease in the mid LAD, and he underwent insertion of a new mid-LAD stents as well stenting of his PLA branch of his right coronary artery with a 2.75 x 30 mm Cypher stent post dilated 3.25 mm. He did also have some mild additional concomitant CAD.  Marland Kitchen COLONOSCOPY    . CORONARY ANGIOPLASTY WITH STENT PLACEMENT       Medications  Prior to Admission: Prior to Admission medications   Medication Sig Start Date End Date Taking? Authorizing Provider  aspirin 81 MG tablet Take 81 mg by mouth every other day.    Yes [provider]  atorvastatin (LIPITOR) 40 MG tablet Take 1 tablet (40 mg total) by mouth daily. 09/09/17  Yes Denita Lung, MD  clopidogrel (PLAVIX) 75 MG tablet Take 1 tablet (75 mg total) by mouth daily. 09/09/17  Yes Denita Lung, MD  cyclobenzaprine (FLEXERIL) 10 MG tablet Take 10 mg by mouth 3 (three) times daily. 11/28/17  Yes [provider]  diclofenac (VOLTAREN) 25 MG EC tablet Take 25 mg by mouth 3 (three) times daily. 11/28/17  Yes [provider]  finasteride (PROSCAR) 5 MG tablet Take 1  tablet (5 mg total) by mouth daily. 09/09/17  Yes Denita Lung, MD  metoprolol succinate (TOPROL-XL) 25 MG 24 hr tablet Take 1 tablet (25 mg total) by mouth daily. 09/09/17  Yes Denita Lung, MD  nitroGLYCERIN (NITROSTAT) 0.4 MG SL tablet Place 1 tablet (0.4 mg total) under the tongue every 5 (five) minutes as needed for chest pain. 08/13/17  Yes Troy Sine, MD  ramipril (ALTACE) 2.5 MG capsule Take 1 capsule (2.5 mg total) by mouth 2 (two) times daily. 09/09/17  Yes Denita Lung, MD     Allergies:   No Known Allergies  Social History:   Social History   Socioeconomic History  . Marital status: Married    Spouse name: Not on file  . Number of children: 0  . Years of education: Not on file  . Highest education level: Not on file  Occupational History  . Occupation: retired [employer]  Social Needs  . Financial resource strain: Not on file  . Food insecurity:    Worry: Not on file    Inability: Not on file  . Transportation needs:    Medical: Not on file    Non-medical: Not on file  Tobacco Use  . Smoking status: Never Smoker  . Smokeless tobacco: Never Used  Substance and Sexual Activity  . Alcohol use: Yes    Alcohol/week: 0.0 standard drinks    Comment: occ.  . Drug use: No  . Sexual activity: Yes  Lifestyle  . Physical activity:    Days per week: Not on file    Minutes per session: Not on file  . Stress: Not on file  Relationships  . Social connections:    Talks on phone: Not on file    Gets together: Not on file    Attends religious service: Not on file    Active member of club or organization: Not on file    Attends meetings of clubs or organizations: Not on file    Relationship status: Not on file  . Intimate partner violence:    Fear of current or ex partner: Not on file    Emotionally abused: Not on file    Physically abused: Not on file    Forced sexual activity: Not on file  Other Topics Concern  . Not on file  Social History Narrative  . Not on  file    Family History:   The patient's family history includes Colon cancer in his maternal grandfather; Crohn's disease in his brother; Heart disease in his father.    ROS:  Please see the history of present illness.  General:no colds or fevers, no weight changes Skin:no rashes or ulcers HEENT:no blurred vision, no congestion  CV:see HPI PUL:see HPI GI:no diarrhea constipation or melena, no indigestion GU:no hematuria, no dysuria MS:no joint pain, no claudication Neuro:no syncope, no lightheadedness Endo:no diabetes, no thyroid disease     Physical Exam/Data:   Vitals:   11/30/17 0945 11/30/17 1000 11/30/17 1015 11/30/17 1030  BP: 132/85 (!) 133/92 123/78 (!) 124/92  Pulse: (!) 59 60 (!) 57 (!) 55  Resp: 15 15 15 14   Temp:      TempSrc:      SpO2: 96% 93% 95% 95%   No intake or output data in the 24 hours ending 11/30/17 1126 There were no vitals filed for this visit. There is no height or weight on file to calculate BMI.  General:  Well nourished, well developed, in no acute distress HEENT: normal Lymph: no adenopathy Neck: no JVD Endocrine:  No thryomegaly Vascular: No carotid bruits; pedal pulses 1+ bilaterally, 2+ radial pulses bil  Cardiac:  normal S1, S2; RRR; no murmur, gallup rub or click  Lungs:  clear to auscultation bilaterally, no wheezing, rhonchi or rales  Abd: soft, nontender, no hepatomegaly  Ext: no lower ext edema Musculoskeletal:  No deformities, BUE and BLE strength normal and equal, no pain to palpation  Skin: warm and dry  Neuro:  CNs 2-12 intact, no focal abnormalities noted Psych:  Normal affect    Relevant CV Studies: Last cath 2009 with in stent restenosis of LAD with new stent placed and stent to PLA of RCA.  EF 45% and on echo 2011 with EF 40%.    Laboratory Data:  Chemistry Recent Labs  Lab 11/30/17 0912  NA 140  K 4.0  CL 107  CO2 27  GLUCOSE 102*  BUN 17  CREATININE 1.34*  CALCIUM 9.6  GFRNONAA 55*  GFRAA >60    ANIONGAP 6    No results for input(s): PROT, ALBUMIN, AST, ALT, ALKPHOS, BILITOT in the last 168 hours. Hematology Recent Labs  Lab 11/30/17 0912  WBC 7.0  RBC 5.61  HGB 16.7  HCT 51.3  MCV 91.4  MCH 29.8  MCHC 32.6  RDW 13.0  PLT 190   Cardiac EnzymesNo results for input(s): TROPONINI in the last 168 hours.  Recent Labs  Lab 11/30/17 0916  TROPIPOC 0.04    BNPNo results for input(s): BNP, PROBNP in the last 168 hours.  DDimer No results for input(s): DDIMER in the last 168 hours.  Radiology/Studies:  Dg Chest 2 View  Result Date: 11/30/2017 CLINICAL DATA:  Chest pain EXAM: CHEST - 2 VIEW COMPARISON:  June 07, 2007 FINDINGS: There is apparent nipple shadow on the left. No edema or consolidation. Heart size and pulmonary vascularity are normal. No adenopathy. No bone lesions preop coronary stent noted in the left anterior descending coronary artery region. IMPRESSION: Apparent nipple shadow on the left; repeat study with nipple markers advised to confirm. No edema or consolidation. Stable cardiac silhouette. Electronically Signed   By: Lowella Grip III M.D.   On: 11/30/2017 09:43    Assessment and Plan:   1. Unstable angina initial troponin 0.04 - pain similar to prior acute Ant. MI and [place] both episodes required stenting to LAD.  On ASA 81 mg daily and plavix 75 mg daily.  Also on BB with toprol XL 25 mg every 24 hours.  On altace.  Will add IV NTG and IV morphine - more significant pain.  Add IV heparin.  Admit to step down  He has had ASA 325 mg .   2.  HLD on statin lipitor 40 mg  Statins checked last year and controlled with LDL of 54, TG were elevated at 219.   3. HTN  Diastolic here of 161/09 to 121/83 on above meds.    Severity of Illness: The appropriate patient status for this patient is INPATIENT. Inpatient status is judged to be reasonable and necessary in order to provide the required intensity of service to ensure the patient's safety. The patient's  presenting symptoms, physical exam findings, and initial radiographic and laboratory data in the context of their chronic comorbidities is felt to place them at high risk for further clinical deterioration. Furthermore, it is not anticipated that the patient will be medically stable for discharge from the hospital within 2 midnights of admission. The following factors support the patient status of inpatient.   " The patient's presenting symptoms include subscapular pain which has been angina/ MI equivalent. " The worrisome physical exam findings include continued pain. " The initial radiographic and laboratory data are worrisome because of no abnormalities yet, but early needs serial troponin.. " The chronic co-morbidities include CAD with prior Ant MI and stent to LAD and then restenosis of same area with stent and stent to PLA.  .   * I certify that at the point of admission it is my clinical judgment that the patient will require inpatient hospital care spanning beyond 2 midnights from the point of admission due to high intensity of service, high risk for further deterioration and high frequency of surveillance required.*    For questions or updates, please contact [place] Please consult [email or web address] for contact info under        Signed, Cecilie Kicks, NP  11/30/2017 11:26 AM   I have examined the patient and reviewed assessment and plan and discussed with patient.  Agree with above as stated.  Elevated troponin with ongoing pain.  Plan for cath. Cardiac catheterization was discussed with the patient fully. The patient understands that risks include but are not limited to stroke (1 in 1000), death (1 in 3), kidney failure [usually temporary] (1 in 500), bleeding (1 in 200), allergic reaction [possibly serious] (1 in 200).  The patient understands and is willing to proceed.    He is agreeable to the procedure.   Larae Grooms

## 2017-11-30 NOTE — Progress Notes (Signed)
Patient arrived via ED stretcher at approximately 1400; patient was complaining of 8/10 back pain at this time which he states is the same pain he had with his prior MI. Patient visibly uncomfortable. Nitro infusing at 58mcg/min. 2 mg IV morphine given by this RN. Initial BP 126/92 upon arrival to floor. IV heparin and NS initiated at this time per orders. Patient continued to c/o severe pain. Nitro titrated up to 20 mcg/min. EKG obtained. At 1417, patient's BP dropped to 83/45. Nitro titrated down, then turned off as BP dropped to 62/46 and patient became diaphoretic. Robbie Lis PA paged; advised RN to call Dr. Irish Lack who was called and updated. 250 cc NS bolus initiated. Rapid response RN to bedside. Patient transported urgently to cath lab.

## 2017-11-30 NOTE — ED Notes (Signed)
Patient transported to CT 

## 2017-11-30 NOTE — Progress Notes (Signed)
ANTICOAGULATION CONSULT NOTE - Initial Consult  Pharmacy Consult for heparin Indication: chest pain/ACS  No Known Allergies  Patient Measurements: Actual body weight: 113 kg (patient reported) Ideal body weight: 80 kg  Heparin Dosing Weight: 103 kg   Vital Signs: Temp: 98.3 F (36.8 C) (11/25 0904) Temp Source: Oral (11/25 0904) BP: 124/92 (11/25 1030) Pulse Rate: 55 (11/25 1030)  Labs: Recent Labs    11/30/17 0912  HGB 16.7  HCT 51.3  PLT 190  CREATININE 1.34*    CrCl cannot be calculated (Unknown ideal weight.).   Medical History: Past Medical History:  Diagnosis Date  . ASHD (arteriosclerotic heart disease)    stent  . Dyslipidemia   . History of stress test 09/2011   Which showed his previously documented scarring anteriorly and apically, concordant with his LAD infarction. Post-stress EF 40%  . Hx of echocardiogram    showed an EF of approximately 40% with moderate-to-severe anterior wall hypokenisis and moderate-to-severe apical wall hypokinesis. He did have mild aortic sclerosis. There was mild pulmonary hypertensin with estimated RV systolic pressure of 34 mm.  . Hyperlipidemia   . Melanoma ([place])    torso  . MI (myocardial infarction) ([place]) 2004, 2009   Large Anterior wall, at which he underwent stenting of his promixal occluded LAD with ultimate insertion of a 3.5 x 28 mm Cypher stent post dilated to 4.1 cm.    Medications:   (Not in a hospital admission)  Assessment: 50 YOM with significant cardiac history presents with back pain. Pharmacy consulted to start IV heparin for ACS. Initial trop is 0.04. He is not on any anticoagulation prior to admission. H/H and Plt wnl on admit.   Goal of Therapy:  Heparin level 0.3-0.7 units/ml Monitor platelets by anticoagulation protocol: Yes   Plan:  -Heparin 4000 units IV bolus, then start IV heparin infusion at 1300 units/hr  -F/u 6 hr HL -Monitor daily HL, CBC and s/s of bleeding  Albertina Parr,  PharmD., BCPS Clinical Pharmacist Clinical phone for 11/30/17 until 3:30pm: [phone] If after 3:30pm, please refer to [facility] for unit-specific pharmacist

## 2017-11-30 NOTE — ED Notes (Signed)
Pt c/o pain between shoulders- EKG repeated- given to Dr. Lita Mains.

## 2017-11-30 NOTE — Telephone Encounter (Signed)
LVM for pt to make an appt. KH 

## 2017-12-01 ENCOUNTER — Other Ambulatory Visit: Payer: Self-pay

## 2017-12-01 ENCOUNTER — Inpatient Hospital Stay (HOSPITAL_COMMUNITY): Payer: BLUE CROSS/BLUE SHIELD

## 2017-12-01 ENCOUNTER — Encounter (HOSPITAL_COMMUNITY): Payer: Self-pay | Admitting: Interventional Cardiology

## 2017-12-01 DIAGNOSIS — M546 Pain in thoracic spine: Secondary | ICD-10-CM

## 2017-12-01 DIAGNOSIS — I2510 Atherosclerotic heart disease of native coronary artery without angina pectoris: Secondary | ICD-10-CM

## 2017-12-01 DIAGNOSIS — I25110 Atherosclerotic heart disease of native coronary artery with unstable angina pectoris: Secondary | ICD-10-CM

## 2017-12-01 DIAGNOSIS — I200 Unstable angina: Secondary | ICD-10-CM

## 2017-12-01 DIAGNOSIS — E785 Hyperlipidemia, unspecified: Secondary | ICD-10-CM

## 2017-12-01 DIAGNOSIS — I10 Essential (primary) hypertension: Secondary | ICD-10-CM

## 2017-12-01 LAB — BASIC METABOLIC PANEL
ANION GAP: 7 (ref 5–15)
BUN: 17 mg/dL (ref 8–23)
CO2: 23 mmol/L (ref 22–32)
Calcium: 8.8 mg/dL — ABNORMAL LOW (ref 8.9–10.3)
Chloride: 108 mmol/L (ref 98–111)
Creatinine, Ser: 1.2 mg/dL (ref 0.61–1.24)
Glucose, Bld: 95 mg/dL (ref 70–99)
Potassium: 4 mmol/L (ref 3.5–5.1)
SODIUM: 138 mmol/L (ref 135–145)

## 2017-12-01 LAB — CBC
HCT: 44.4 % (ref 39.0–52.0)
Hemoglobin: 14.3 g/dL (ref 13.0–17.0)
MCH: 28.9 pg (ref 26.0–34.0)
MCHC: 32.2 g/dL (ref 30.0–36.0)
MCV: 89.7 fL (ref 80.0–100.0)
NRBC: 0 % (ref 0.0–0.2)
PLATELETS: 167 10*3/uL (ref 150–400)
RBC: 4.95 MIL/uL (ref 4.22–5.81)
RDW: 13.2 % (ref 11.5–15.5)
WBC: 8.4 10*3/uL (ref 4.0–10.5)

## 2017-12-01 LAB — ECHOCARDIOGRAM COMPLETE: WEIGHTICAEL: 4155.23 [oz_av]

## 2017-12-01 LAB — LIPID PANEL
Cholesterol: 133 mg/dL (ref 0–200)
HDL: 37 mg/dL — ABNORMAL LOW (ref >40)
LDL CALC: 60 mg/dL (ref 0–99)
Total CHOL/HDL Ratio: 3.6 RATIO
Triglycerides: 181 mg/dL — ABNORMAL HIGH (ref <150)
VLDL: 36 mg/dL (ref 0–40)

## 2017-12-01 LAB — TROPONIN I: TROPONIN I: 0.04 ng/mL — AB (ref <0.03)

## 2017-12-01 LAB — POCT ACTIVATED CLOTTING TIME: Activated Clotting Time: 296 seconds

## 2017-12-01 LAB — HIV ANTIBODY (ROUTINE TESTING W REFLEX): HIV Screen 4th Generation wRfx: NONREACTIVE

## 2017-12-01 MED ORDER — ASPIRIN 81 MG PO TBEC: 81.0000 mg | DELAYED_RELEASE_TABLET | Freq: Every day | ORAL | Status: DC

## 2017-12-01 MED ORDER — ASPIRIN 81 MG PO TABS: 81.0000 mg | ORAL_TABLET | Freq: Every day | ORAL | Status: AC

## 2017-12-01 MED ORDER — TRAMADOL HCL 50 MG PO TABS: 25.0000 mg | ORAL_TABLET | Freq: Once | ORAL | Status: DC

## 2017-12-01 MED ORDER — ATORVASTATIN CALCIUM 80 MG PO TABS: 80.0000 mg | ORAL_TABLET | Freq: Every day | ORAL | 3 refills | Status: DC

## 2017-12-01 MED ORDER — PERFLUTREN LIPID MICROSPHERE
1.0000 mL | INTRAVENOUS | Status: DC | PRN
  Administered 2017-12-01: 11:00:00 3 mL via INTRAVENOUS
  Filled 2017-12-01: qty 10

## 2017-12-01 MED ORDER — TRAMADOL HCL 50 MG PO TABS: 50.0000 mg | ORAL_TABLET | Freq: Four times a day (QID) | ORAL | 0 refills | Status: DC | PRN

## 2017-12-01 MED ORDER — TRAMADOL HCL 50 MG PO TABS
50.0000 mg | ORAL_TABLET | Freq: Once | ORAL | Status: AC
  Administered 2017-12-01: 10:00:00 50 mg via ORAL
  Filled 2017-12-01: qty 1

## 2017-12-01 NOTE — Progress Notes (Signed)
  Echocardiogram 2D Echocardiogram definity has been performed.  Darlina Sicilian M 12/01/2017, 11:35 AM

## 2017-12-01 NOTE — Telephone Encounter (Addendum)
SPOKE TO PATIENT.  PATIENT STATES HE  IS HAVING THE SAME  SHOULDER BACK PAIN AS EARLIER TODAY  -  PATIENT STATES  HE RECEIVED A MEDICATION FROM  DR Irish Lack  "RELAXER".   PATIENT STATES HE TOOK 650 MG X 2 TYLENOL @ 2:30 PM  THIS AFTERNOON   PATIENT WANTS SEE IF A PRESCRIPTION CAN BE CALLED INT PHARMACY.  RN INFORMED PATIENT WILL CONTACT DR Irish Lack FOR PRESCRIPTION.  RN SPOKE TO THR DOCTOR VERBAL ORDER GIVEN. PATIENT AWARE. PATIENT AWARE TO CONTACT   PRIMARY TO FOLLOW THE PAIN IN SHOULDER BLADE PRESCRIPTION CALLED INTO PHARMACY.  PATIENT AWARE NO REFILLS

## 2017-12-01 NOTE — Progress Notes (Signed)
CARDIAC REHAB PHASE I   PRE:  Rate/Rhythm: 78 SR  BP:  Sitting: 120/84      SaO2: 97 RA  MODE:  Ambulation: 450 ft   POST:  Rate/Rhythm: 60 SR  BP:  Sitting: 133/95    SaO2: 96 RA   Pt ambulated 414ft in hallway standby assist with slow gait. Pt c/o feeling slightly unsteady on feet, and worsening upper back pain with ambulation. RN and MD made aware. Pt educated on importance of ASA, Plavix, and NTG. Pt states his wife has his stent card. Pt given heart healthy diet. Reviewed restrictions and exercise guidelines. Will refer to CRP II [place] Rufina Falco, RN BSN 12/01/2017 9:31 AM

## 2017-12-01 NOTE — Telephone Encounter (Signed)
Follow up   Patient states that he had cath done on 11/30/2017. The patient states that he is still having pain between his shoulder blades. Please call to discuss.

## 2017-12-01 NOTE — Discharge Instructions (Signed)
Post Cardiac Catheterization: NO HEAVY LIFTING OR SEXUAL ACTIVITY X 7 DAYS. NO DRIVING X 3-5 DAYS. NO SOAKING BATHS, HOT TUBS, POOLS, ETC., X 7 DAYS.  Radial Site Care: Refer to this sheet in the next few weeks. These instructions provide you with information on caring for yourself after your procedure. Your caregiver may also give you more specific instructions. Your treatment has been planned according to current medical practices, but problems sometimes occur. Call your caregiver if you have any problems or questions after your procedure. HOME CARE INSTRUCTIONS  You may shower the day after the procedure.Remove the bandage (dressing) and gently wash the site with plain soap and water.Gently pat the site dry.   Do not apply powder or lotion to the site.   Do not submerge the affected site in water for 3 to 5 days.   Inspect the site at least twice daily.   Do not flex or bend the affected arm for 24 hours.   No lifting over 5 pounds (2.3 kg) for 5 days after your procedure.   Do not drive home if you are discharged the same day of the procedure. Have someone else drive you.  What to expect:  Any bruising will usually fade within 1 to 2 weeks.   Blood that collects in the tissue (hematoma) may be painful to the touch. It should usually decrease in size and tenderness within 1 to 2 weeks.  SEEK IMMEDIATE MEDICAL CARE IF:  You have unusual pain at the radial site.   You have redness, warmth, swelling, or pain at the radial site.   You have drainage (other than a small amount of blood on the dressing).   You have chills.   You have a fever or persistent symptoms for more than 72 hours.   You have a fever and your symptoms suddenly get worse.   Your arm becomes pale, cool, tingly, or numb.   You have heavy bleeding from the site. Hold pressure on the site.    

## 2017-12-01 NOTE — Discharge Summary (Addendum)
Discharge Summary    Brandon Frederick ID: Brandon Frederick MRN: [id number]; DOB: 1955-05-30  Admit date: 11/30/2017 Discharge date: 12/01/2017  Primary Care Provider: Denita Lung, MD  Primary Cardiologist: Shelva Majestic, MD  Primary Electrophysiologist:  None   Discharge Diagnoses    Principal Problem:   Unstable angina [facility]) Active Problems:   Hyperlipidemia with target LDL less than 70   Essential hypertension   CAD (coronary artery disease)   Allergies No Known Allergies  Diagnostic Studies/Procedures    Left Heart Catheterization 11/30/2017:  There is mild left ventricular systolic dysfunction. LVEDP 11 mm Hg.  The left ventricular ejection fraction is 45-50% by visual estimate. Anterolateral hypokinesis.  There is no aortic valve stenosis.  Prox LAD lesion is 25% stenosed.  Patent LAD stents with mild distal restenosis.  Mid LAD lesion is 40% stenosed.  Ost 1st Diag lesion is 95% stenosed. Branch of diagonal occluded with left to left collaterals.  Ost 1st Mrg lesion is 50% stenosed.  Ost Cx to Prox Cx lesion is 75% stenosed.  A drug-eluting stent was successfully placed using a STENT SYNERGY DES 3X16.  Post intervention, there is a 0% residual stenosis.   Recommend dual antiplatelet therapy with Aspirin 81mg  daily and Clopidogrel 75mg  daily long-term (beyond 12 months) because of long area of Cypher stents in the LAD.   Could consider clopidogrel monotherapy after 1 year if there are bleeding issues.   Continue aggressive secondary prevention.  _____________   History of Present Illness     Brandon Frederick is a 62 year old male with a history of anterior MI in 06/2002 with PCI/DES to LAD and last catheterization in 2009 with stent to mLAD (due to stent restenosis) and stent to PLA of RCA, hypertension, and hyperlipidemia, who presented to the [facility] ED yesterday on 11/30/2017 for evaluation of intermittent upper back pain for the past 7 days that has been  worsening over the last 2-3 days.  He was seen at an Urgent Care for the pain and was prescribed a muscle relaxer but states it did not help. Brandon Frederick describes pain as sharp stabbing pain. He denies any associated nausea, diaphoresis, or shortness of breath. Pain does not increase with palpation of movement. Pain denies any chest pain; however, pain similar to pain prior to heart attack in 2004.   Upon arrival to the [facility] ED, EKG showed sinus bradycardia, rate 57 bpm, with Q waves in anterior leads but no acute ischemic changes compared to prior tracings in 2017. I-stat troponin negative at 0.04. Chest x-ray showed no edema or consolidation. CBC unremarkable. Na 140, K 4.0, Glucose 102, SCr 1.34. CTA showed no evidence of pulmonary emboli but did show an area of moderate narrowing of the LAD as well as a 56mm nodule in the anterior aspect of the right middle lobe. Brandon Frederick was admitted for further evaluation of unstable angina.   Hospital Course     Consultants: None  Brandon Frederick was admitted for evaluation angina as stated above. Repeat troponin peaked at 0.04. Rapid response was called yesterday on 11/30/2017 at 14:22 due to worsening pain between his shoulder blades. Brandon Frederick was pale and diaphoretic with blood pressure of 62/46. Brandon Frederick was started on normal saline bolus and was taken directly to the cath lab. Left heart catheterization showed ostial 1st diagonal lesion 95% stenosed and 1st diagonal lesion 100% stenosed with left to left collaterals and ostial circumflex to proximal circumflex lesion 75% stenosed. Brandon Frederick underwent successful PCI with  DES to ostial circumflex to proximal circumflex lesion. Brandon Frederick tolerated the procedure well without any complications. Echocardiogram results pending.   - Continue dual anti-platelet therapy with Aspirin 81mg  daily and Plavix 75mg  daily long-term (beyond 12 months) given long area of Cypher stents in the LAD. - Continue aggressive secondary  prevention.  Diastolic blood pressure has been elevated her with max of 98 mmHg. Brandon Frederick hypotensive yesterday prior to cardiac catheterization but blood pressure has since improved. Most recent BP 130/95. Will continue home Toprol-XL 25mg  and Ramipril 2.5mg  twice daily at discharge.  Lipid panel this admission: total cholesterol of 133, triglycerides elevated at 181, HDL low at 37, and LDL of 60. LDL at goal of <70 given CAD. Will continue Lipitor 80mg  daily at discharge.  Brandon Frederick seen and examined this morning. He reports some upper back pain that is worse with ambulation but notes significant improvement compared to yesterday. Will give one time dose of Tramadol prior to discharge. Recommend following up with PCP for any chronic back pain.  Of note, chest CTA showed a 35mm nodule in the anterior aspect of the right middle lobe. No follow-up needed if Brandon Frederick is low-risk. Non-contrast chest CT can be considered in 12 months if Brandon Frederick is high risk. Will defer to PCP.   Outpatient follow-up has been arranged. Mediations as below. _____________  Discharge Vitals Blood pressure (!) 130/95, pulse (!) 57, temperature 97.6 F (36.4 C), temperature source Oral, resp. rate 14, weight 117.8 kg, SpO2 97 %.  Filed Weights   12/01/17 0457  Weight: 117.8 kg   General: 62 year old  male resting comfortably. Alert and in no acute distress. Pleasant and cooperative.  Head: Normocephalic and atraumatic. Eyes: Sclera clear with no signs of icterus. No xanthomas noted. Neck: Supple. No JVD.    Lungs: No increased work of breathing. Clear bilaterally to auscultation. Heart: RRR. Distinct S1 and S2. No murmurs, gallops, or rubs. Right radial cath site without hematoma.  Abdomen: Soft, non-distended, and non-tender. Bowel sounds present. MSK: Normal strength and tone for age. Extremities: No lower extremity edema. Skin: Warm and dry.  Neuro: Alert and oriented x3. No focal deficits. Psych: Normal affect.  Responds appropriately.   Labs & Radiologic Studies    CBC Recent Labs    11/30/17 0912 12/01/17 0013  WBC 7.0 8.4  HGB 16.7 14.3  HCT 51.3 44.4  MCV 91.4 89.7  PLT 190 619   Basic Metabolic Panel Recent Labs    11/30/17 0912 11/30/17 1316 12/01/17 0013  NA 140  --  138  K 4.0  --  4.0  CL 107  --  108  CO2 27  --  23  GLUCOSE 102*  --  95  BUN 17  --  17  CREATININE 1.34*  --  1.20  CALCIUM 9.6  --  8.8*  MG  --  2.0  --    Liver Function Tests Recent Labs    11/30/17 1316  AST 23  ALT 34  ALKPHOS 67  BILITOT 0.8  PROT 6.5  ALBUMIN 3.9   No results for input(s): LIPASE, AMYLASE in the last 72 hours. Cardiac Enzymes Recent Labs    11/30/17 1316 11/30/17 1826 12/01/17 0013  TROPONINI <0.03 <0.03 0.04*   BNP Invalid input(s): POCBNP D-Dimer No results for input(s): DDIMER in the last 72 hours. Hemoglobin A1C Recent Labs    11/30/17 1411  HGBA1C 5.6   Fasting Lipid Panel Recent Labs    12/01/17 0013  CHOL  133  HDL 37*  LDLCALC 60  TRIG 181*  CHOLHDL 3.6   Thyroid Function Tests Recent Labs    11/30/17 1316  TSH 3.199   _____________  Dg Chest 2 View  Result Date: 11/30/2017 CLINICAL DATA:  Chest pain EXAM: CHEST - 2 VIEW COMPARISON:  June 07, 2007 FINDINGS: There is apparent nipple shadow on the left. No edema or consolidation. Heart size and pulmonary vascularity are normal. No adenopathy. No bone lesions preop coronary stent noted in the left anterior descending coronary artery region. IMPRESSION: Apparent nipple shadow on the left; repeat study with nipple markers advised to confirm. No edema or consolidation. Stable cardiac silhouette. Electronically Signed   By: Lowella Grip III M.D.   On: 11/30/2017 09:43   Ct Angio Chest Aorta W/cm &/or Wo/cm  Result Date: 11/30/2017 CLINICAL DATA:  Mid upper back pain. EXAM: CT ANGIOGRAPHY CHEST WITH CONTRAST TECHNIQUE: Multidetector CT imaging of the chest was performed using the standard  protocol during bolus administration of intravenous contrast. Multiplanar CT image reconstructions and MIPs were obtained to evaluate the vascular anatomy. CONTRAST:  151mL ISOVUE-370 IOPAMIDOL (ISOVUE-370) INJECTION 76% COMPARISON:  Chest x-ray dated 11/30/2017 FINDINGS: Cardiovascular: No evidence of pulmonary emboli. Multiple stents in the left coronary artery. There appears to be an area of moderate narrowing of the left anterior descending coronary artery on images 67 and 68. Heart size is normal.  No pericardial effusion. Mediastinum/Nodes: No enlarged mediastinal, hilar, or axillary lymph nodes. Thyroid gland, trachea, and esophagus demonstrate no significant findings. Lungs/Pleura: There is a 4 mm nodule in the anterior aspect of the right middle lobe on image 90 of series 6. 2 mm nodule in the periphery of the right upper lobe posterolaterally on image 56 of series 6. No infiltrates or effusions. Upper Abdomen: Negative. Musculoskeletal: No chest wall abnormality. No acute or significant osseous findings. Review of the MIP images confirms the above findings. IMPRESSION: 1. No pulmonary emboli. 2. There appears to be an area of moderate narrowing of the left anterior descending coronary artery as described above. 3. 4 mm nodule in the anterior aspect of the right middle lobe. No follow-up needed if Brandon Frederick is low-risk. Non-contrast chest CT can be considered in 12 months if Brandon Frederick is high-risk. This recommendation follows the consensus statement: Guidelines for Management of Incidental Pulmonary Nodules Detected on CT Images: From the Fleischner Society 2017; Radiology 2017; 284:228-243. Electronically Signed   By: Lorriane Shire M.D.   On: 11/30/2017 13:11   Disposition   Brandon Frederick is being discharged home today in good condition.  Follow-up Plans & Appointments    Follow-up Information    [facility] Follow up.   Specialty:  Cardiology Why:  You have a scheduled hospital follow-up  visit with Almyra Deforest, PA-C, on Thursday 12/17/2017 at 8:30am. Please arrive 15 minutes early for check in. Contact information: [address] [place] [phone]         Discharge Instructions    Amb Referral to Cardiac Rehabilitation   Complete by:  As directed    Diagnosis:  Coronary Stents   Diet - low sodium heart healthy   Complete by:  As directed    Increase activity slowly   Complete by:  As directed       Discharge Medications   Allergies as of 12/01/2017   No Known Allergies     Medication List    TAKE these medications   aspirin 81 MG tablet  Take 1 tablet (81 mg total) by mouth daily. What changed:  when to take this   atorvastatin 80 MG tablet Commonly known as:  LIPITOR Take 1 tablet (80 mg total) by mouth daily at 6 PM. What changed:    medication strength  how much to take  when to take this   clopidogrel 75 MG tablet Commonly known as:  PLAVIX Take 1 tablet (75 mg total) by mouth daily.   cyclobenzaprine 10 MG tablet Commonly known as:  FLEXERIL Take 10 mg by mouth 3 (three) times daily.   diclofenac 25 MG EC tablet Commonly known as:  VOLTAREN Take 25 mg by mouth 3 (three) times daily.   finasteride 5 MG tablet Commonly known as:  PROSCAR Take 1 tablet (5 mg total) by mouth daily.   metoprolol succinate 25 MG 24 hr tablet Commonly known as:  TOPROL-XL Take 1 tablet (25 mg total) by mouth daily.   nitroGLYCERIN 0.4 MG SL tablet Commonly known as:  NITROSTAT Place 1 tablet (0.4 mg total) under the tongue every 5 (five) minutes as needed for chest pain.   ramipril 2.5 MG capsule Commonly known as:  ALTACE Take 1 capsule (2.5 mg total) by mouth 2 (two) times daily.         Outstanding Labs/Studies   Echocardiogram results pending.  Duration of Discharge Encounter   Greater than 30 minutes including physician time.  Signed, Darreld Mclean, PA-C 12/01/2017, 12:09 PM   I have  examined the Brandon Frederick and reviewed assessment and plan and discussed with Brandon Frederick.  Agree with above as stated.   GEN: Well nourished, well developed, in no acute distress  HEENT: normal  Neck: no JVD, carotid bruits, or masses Cardiac: RRR; no murmurs, rubs, or gallops,no edema  Respiratory:  clear to auscultation bilaterally, normal work of breathing GI: soft, nontender, nondistended,  MS: no deformity or atrophy  Skin: warm and dry, no rash Neuro:  Strength and sensation are intact Psych: euthymic mood, full affect  Stable right wrist site.  Doing well post PCI.  He has some mild upper back pain which appears to be more musculoskeletal.  There is a point on the left scapula that is painful with palpation.  OK to try Ultram.  Continue aggressive secondary prevention.  He has been on DAPT with asa and plavix for a long time.  COntinue DAPT for now.   Jettie Booze, MD

## 2017-12-02 ENCOUNTER — Ambulatory Visit (INDEPENDENT_AMBULATORY_CARE_PROVIDER_SITE_OTHER): Payer: BLUE CROSS/BLUE SHIELD | Admitting: Family Medicine

## 2017-12-02 ENCOUNTER — Encounter: Payer: Self-pay | Admitting: Family Medicine

## 2017-12-02 ENCOUNTER — Ambulatory Visit
Admission: RE | Admit: 2017-12-02 | Discharge: 2017-12-02 | Disposition: A | Discharge status: Home / Self Care | Payer: BLUE CROSS/BLUE SHIELD | Source: Ambulatory Visit | Attending: Family Medicine | Admitting: Family Medicine

## 2017-12-02 ENCOUNTER — Telehealth: Payer: Self-pay | Admitting: Cardiovascular Disease

## 2017-12-02 VITALS — BP 128/82 | HR 60 | Temp 97.8°F | Wt 256.0 lb

## 2017-12-02 DIAGNOSIS — M549 Dorsalgia, unspecified: Secondary | ICD-10-CM

## 2017-12-02 DIAGNOSIS — I2510 Atherosclerotic heart disease of native coronary artery without angina pectoris: Secondary | ICD-10-CM | POA: Diagnosis not present

## 2017-12-02 DIAGNOSIS — M47814 Spondylosis without myelopathy or radiculopathy, thoracic region: Secondary | ICD-10-CM | POA: Diagnosis not present

## 2017-12-02 MED ORDER — ATORVASTATIN CALCIUM 80 MG PO TABS: 80.0000 mg | ORAL_TABLET | Freq: Every day | ORAL | 3 refills | Status: DC

## 2017-12-02 MED ORDER — TRAMADOL HCL 50 MG PO TABS: 50.0000 mg | ORAL_TABLET | Freq: Four times a day (QID) | ORAL | 0 refills | Status: DC | PRN

## 2017-12-02 NOTE — Patient Instructions (Signed)
Take 2 Tylenol 4 times per day for pain and to use a muscle relaxer also use the tramadol

## 2017-12-02 NOTE — Telephone Encounter (Signed)
New script sent to [email or web address] at pt's request ./cy

## 2017-12-02 NOTE — Progress Notes (Signed)
   Subjective:    Patient ID: Brandon Frederick, male    DOB: [birth year], 62 y.o.   MRN: [id number]  HPI He is here for follow-up on continued difficulty with it thoracic pain.  He describes the pain is the same one he had when he had previous heart trouble.  He was recently discharged from the hospital and had a very extensive work-up for his unstable angina and coronary disease.  See discharge summary from yesterday.  He is here because he continues have difficulty with the thoracic pain.  The pain is not worse with movement, breathing.  There is no referral of the pain into the lateral aspect of his chest.  He has no numbness tingling or weakness.   Review of Systems     Objective:   Physical Exam Full motion of the upper back.  No palpable tenderness to palpation.  Normal sensation.      Assessment & Plan:  ASHD (arteriosclerotic heart disease)  Upper back pain - Plan: DG Thoracic Spine W/Swimmers, traMADol (ULTRAM) 50 MG tablet, DISCONTINUED: traMADol (ULTRAM) 50 MG tablet I explained that this could be something in the thoracic spine area however very unlikely.  I will get an x-ray and take a better look at that.  Explained that this could also be residual pain from his underlying heart disease as this was the same pain that he describes with 2 previous episodes of cardiac related pain.  I will give him tramadol he is also to take Tylenol.  If he continues have difficulty with this, further evaluation including MRI might be needed.

## 2017-12-02 NOTE — Telephone Encounter (Signed)
New message    Pt c/o medication issue:  1. Name of Medication: atorvastatin  2. How are you currently taking this medication (dosage and times per day)? 80 mg  3. Are you having a reaction (difficulty breathing--STAT)?   4. What is your medication issue? Pt has questions about this medication since it was changed.

## 2017-12-07 ENCOUNTER — Encounter: Payer: Self-pay | Admitting: Family Medicine

## 2017-12-07 ENCOUNTER — Other Ambulatory Visit: Payer: Self-pay

## 2017-12-07 DIAGNOSIS — M549 Dorsalgia, unspecified: Secondary | ICD-10-CM

## 2017-12-09 ENCOUNTER — Ambulatory Visit (INDEPENDENT_AMBULATORY_CARE_PROVIDER_SITE_OTHER): Payer: BLUE CROSS/BLUE SHIELD | Admitting: Family Medicine

## 2017-12-09 ENCOUNTER — Encounter (INDEPENDENT_AMBULATORY_CARE_PROVIDER_SITE_OTHER): Payer: Self-pay | Admitting: Family Medicine

## 2017-12-09 DIAGNOSIS — M546 Pain in thoracic spine: Secondary | ICD-10-CM | POA: Diagnosis not present

## 2017-12-09 NOTE — Progress Notes (Signed)
Office Visit Note   Patient: Brandon Frederick           Date of Birth: 1955/09/14           MRN: [id number] Visit Date: 12/09/2017 Requested by: Denita Lung, MD [address] [place], [place] [address] PCP: Denita Lung, MD  Subjective: Chief Complaint  Patient presents with  . tight feeling between shoulder blades off & on    HPI: He is a 62 year old seen at the request of Dr. Redmond School for left sided thoracic pain.  He has a history of cardiac disease status post stenting.  In the past, he had an episode of similar thoracic pain and it ended up being cardiac related.  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  Recently he started having pain again after doing some work, but this time the pain was sharper than usual and it did not go away with rest.  He went to an urgent care on the weekend and EKG was abnormal but not worrisome enough to go to the ER.  He was still having symptoms on Monday so he met his cardiologist in the ER and had catheterization again with stenting.  Unfortunately it has not changed his thoracic pain.  He tried anti-inflammatories and muscle relaxants as well as Tylenol with minimal improvement.  Pain does not radiate, no weakness or numbness in his extremities.  He has been on cholesterol medicine for many years but recently his dosage was increased after his cardiac event.                ROS: He has hypertension and BPH, history of melanoma.  Other systems were negative.  Objective: Vital Signs: There were no vitals taken for this visit.  Physical Exam:  Back: Upper extremity strength and reflexes are normal.  No tenderness along the thoracic spinous processes.  He has a left-sided rhomboid area trigger point that seems to reproduce his pain.  Imaging: No new x-rays done today.  Previous thoracic x-rays and CT scan were  reviewed showing degenerative disc disease but no other significant abnormality.    Assessment & Plan: 1.  Probable myofascial left thoracic pain -Deep tissue massage using a tennis ball.  We will empirically treat with vitamin D3 and coenzyme Q 10.  Consider checking vitamin D levels in the future if pain persists.   - Think he would benefit from physical therapy myofascial release techniques. -MRI scan if he fails conservative management.   Follow-Up Instructions: No follow-ups on file.      Procedures: No procedures performed  No notes on file    PMFS History: Patient Active Problem List   Diagnosis Date Noted  . CAD (coronary artery disease) 12/01/2017  . Unstable angina ([place]) 11/30/2017  . Benign prostatic hyperplasia without lower urinary tract symptoms 09/09/2017  . Mild obesity 09/26/2014  . Essential hypertension 02/26/2013  . Hyperlipidemia with target LDL less than 70 08/23/2012  . Old MI (myocardial infarction) 08/23/2012  . History of melanoma 07/28/2011  . ASHD (arteriosclerotic heart disease) 07/28/2011   Past Medical History:  Diagnosis Date  . ASHD (arteriosclerotic heart disease)    stent  . Dyslipidemia   . History of stress test 09/2011   Which showed his previously documented scarring anteriorly and apically, concordant with his LAD infarction. Post-stress EF 40%  .  Hx of echocardiogram    showed an EF of approximately 40% with moderate-to-severe anterior wall hypokenisis and moderate-to-severe apical wall hypokinesis. He did have mild aortic sclerosis. There was mild pulmonary hypertensin with estimated RV systolic pressure of 34 mm.  . Hyperlipidemia   . Melanoma ([place])    torso  . MI (myocardial infarction) ([place]) 2004, 2009   Large Anterior wall, at which he underwent stenting of his promixal occluded LAD with ultimate insertion of a 3.5 x 28 mm Cypher stent post dilated to 4.1 cm.    Family History  Problem Relation Age of Onset  . Heart  disease Father        5 way bypass  . Colon cancer Maternal Grandfather        dx in his 9's  . Crohn's disease Brother     Past Surgical History:  Procedure Laterality Date  . CARDIAC CATHETERIZATION  06/2007    revealed a progressive disease in the mid LAD, and he underwent insertion of a new mid-LAD stents as well stenting of his PLA branch of his right coronary artery with a 2.75 x 30 mm Cypher stent post dilated 3.25 mm. He did also have some mild additional concomitant CAD.  Marland Kitchen COLONOSCOPY    . CORONARY ANGIOPLASTY WITH STENT PLACEMENT    . CORONARY STENT INTERVENTION N/A 11/30/2017   Procedure: CORONARY STENT INTERVENTION;  Surgeon: Jettie Booze, MD;  Location: [place] [facility];  Service: Cardiovascular;  Laterality: N/A;  . LEFT HEART CATH AND CORONARY ANGIOGRAPHY N/A 11/30/2017   Procedure: LEFT HEART CATH AND CORONARY ANGIOGRAPHY;  Surgeon: Jettie Booze, MD;  Location: [place] [facility];  Service: Cardiovascular;  Laterality: N/A;   Social History   Occupational History  . Occupation: retired [employer]  Tobacco Use  . Smoking status: Never Smoker  . Smokeless tobacco: Never Used  Substance and Sexual Activity  . Alcohol use: Yes    Alcohol/week: 0.0 standard drinks    Comment: occ.  . Drug use: No  . Sexual activity: Yes

## 2017-12-16 DIAGNOSIS — M546 Pain in thoracic spine: Secondary | ICD-10-CM | POA: Diagnosis not present

## 2017-12-17 ENCOUNTER — Ambulatory Visit: Payer: BLUE CROSS/BLUE SHIELD | Admitting: Physician Assistant

## 2017-12-17 ENCOUNTER — Telehealth (HOSPITAL_COMMUNITY): Payer: Self-pay

## 2017-12-17 NOTE — Telephone Encounter (Signed)
Pt insurance is active and benefits verified through [place]. Co-pay $0.00, DED $6,750.00/$4,640.06 met, out of pocket $6,750.00/$4,640.06 met, co-insurance 30%. No pre-authorization required. Passport, 12/17/17 @ 9:35AM, REF# [id number]

## 2017-12-18 ENCOUNTER — Encounter: Payer: Self-pay | Admitting: *Deleted

## 2017-12-21 DIAGNOSIS — M546 Pain in thoracic spine: Secondary | ICD-10-CM | POA: Diagnosis not present

## 2017-12-22 NOTE — Telephone Encounter (Signed)
Attempted to call patient in regards to Cardiac Rehab - LM on VM 

## 2017-12-23 ENCOUNTER — Encounter: Payer: Self-pay | Admitting: Physician Assistant

## 2017-12-23 ENCOUNTER — Ambulatory Visit (INDEPENDENT_AMBULATORY_CARE_PROVIDER_SITE_OTHER): Payer: BLUE CROSS/BLUE SHIELD | Admitting: Physician Assistant

## 2017-12-23 VITALS — BP 110/73 | HR 85 | Ht 75.0 in | Wt 260.0 lb

## 2017-12-23 DIAGNOSIS — IMO0001 Reserved for inherently not codable concepts without codable children: Secondary | ICD-10-CM

## 2017-12-23 DIAGNOSIS — I2510 Atherosclerotic heart disease of native coronary artery without angina pectoris: Secondary | ICD-10-CM

## 2017-12-23 DIAGNOSIS — E785 Hyperlipidemia, unspecified: Secondary | ICD-10-CM | POA: Diagnosis not present

## 2017-12-23 DIAGNOSIS — I10 Essential (primary) hypertension: Secondary | ICD-10-CM

## 2017-12-23 DIAGNOSIS — Z79899 Other long term (current) drug therapy: Secondary | ICD-10-CM

## 2017-12-23 DIAGNOSIS — R911 Solitary pulmonary nodule: Secondary | ICD-10-CM

## 2017-12-23 DIAGNOSIS — I77810 Thoracic aortic ectasia: Secondary | ICD-10-CM

## 2017-12-23 NOTE — Patient Instructions (Signed)
Medication Instructions:  Your Physician recommend you continue on your current medication as directed.    If you need a refill on your cardiac medications before your next appointment, please call your pharmacy.   Lab work: Your physician recommends that you return for lab work in 2 months (Fasting lipids, BMP)  If you have labs (blood work) drawn today and your tests are completely normal, you will receive your results only by: Marland Kitchen MyChart Message (if you have MyChart) OR . A paper copy in the mail If you have any lab test that is abnormal or we need to change your treatment, we will call you to review the results.  Testing/Procedures: None  Follow-Up: At [facility], you and your health needs are our priority.  As part of our continuing mission to provide you with exceptional heart care, we have created designated Provider Care Teams.  These Care Teams include your primary Cardiologist (physician) and Advanced Practice Providers (APPs -  Physician Assistants and Nurse Practitioners) who all work together to provide you with the care you need, when you need it. You will need a follow up appointment in 3 months.  Please call our office 2 months in advance to schedule this appointment.  You may see Shelva Majestic, MD or one of the following Advanced Practice Providers on your designated Care Team: [place], [place] . Fabian Sharp, PA-C

## 2017-12-23 NOTE — Progress Notes (Signed)
Cardiology Office Note    Date:  12/25/2017   ID:  Brandon Frederick 1955/05/24, MRN [id number]  PCP:  Denita Lung, MD  Cardiologist: Dr. Claiborne Billings  Chief Complaint  Patient presents with  . Fatigue    seen for Dr. Claiborne Billings.     History of Present Illness:  Brandon Frederick is a 62 y.o. male with past medical history of CAD, HTN, HLD and history of melanoma.  Patient had anterior MI in June 2004 treated with PCI.  He also had PCI to LAD in 2009 with an additional stent to PLA of RCA.  Previous echocardiogram in 2011 showed EF 40% mild aortic valve atherosclerosis.  Myoview in 2013 showed distal apical anterior scar without ischemia.  Patient recently presented to the hospital on 11/30/2017 with upper back pain.  Troponin was borderline elevated at 0.04.  Given similarity of his presenting symptom compared to the previous MI, the decision was made to proceed with cardiac catheterization.  Cardiac catheterization performed on 11/30/2017 showed EF 45 to 50%, 25% proximal LAD, 40% mid LAD, patent LAD stent with mild distal restenosis, 95% ostial D1 with left to left collaterals, 50% ostial OM 2, 75% ostial left circumflex lesion treated with a 3 x 16 mm Synergy DES.  Echocardiogram obtained on the following day showed EF 45 to 50%, akinesis of the apical inferior and apical myocardium, grade 2 DD, moderate focal basal hypertrophy, mildly dilated aortic root measuring 38 mm.  During the hospitalization, chest CT showed a 4 mm nodule in the anterior aspect of the right middle lobe, no follow-up needed if the patient is low risk, noncontrast CT of the chest clinic can be considered in 12 months if the patient is high risk.  Will defer this to primary care provider.  He also had thoracic spine x-ray obtained on 11/27 which did not show any significant osseous abnormality but does show bulky degenerative anterior endplate spurring from Y8-M57 with preserved thoracic disc spaces.   Patient presents today  for cardiology office visit, he denies any chest pain.  He still occasionally have some back pain however this was felt to be related to a muscle issue on the left side of his back.  He has been compliant with aspirin and Plavix.  EKG continued to show sinus rhythm, poor R wave progression anterior leads along with T wave inversion.  He will need fasting lipid panel and LFTs in 2 months and follow-up with Dr. Claiborne Billings in 3 months.  Overall he is doing well after the recent procedure.   Past Medical History:  Diagnosis Date  . ASHD (arteriosclerotic heart disease)    stent  . Dyslipidemia   . History of stress test 09/2011   Which showed his previously documented scarring anteriorly and apically, concordant with his LAD infarction. Post-stress EF 40%  . Hx of echocardiogram    showed an EF of approximately 40% with moderate-to-severe anterior wall hypokenisis and moderate-to-severe apical wall hypokinesis. He did have mild aortic sclerosis. There was mild pulmonary hypertensin with estimated RV systolic pressure of 34 mm.  . Hyperlipidemia   . Melanoma ([place])    torso  . MI (myocardial infarction) ([place]) 2004, 2009   Large Anterior wall, at which he underwent stenting of his promixal occluded LAD with ultimate insertion of a 3.5 x 28 mm Cypher stent post dilated to 4.1 cm.    Past Surgical History:  Procedure Laterality Date  . CARDIAC CATHETERIZATION  06/2007  revealed a progressive disease in the mid LAD, and he underwent insertion of a new mid-LAD stents as well stenting of his PLA branch of his right coronary artery with a 2.75 x 30 mm Cypher stent post dilated 3.25 mm. He did also have some mild additional concomitant CAD.  Marland Kitchen COLONOSCOPY    . CORONARY ANGIOPLASTY WITH STENT PLACEMENT    . CORONARY STENT INTERVENTION N/A 11/30/2017   Procedure: CORONARY STENT INTERVENTION;  Surgeon: Jettie Booze, MD;  Location: [facility];  Service: Cardiovascular;  Laterality: N/A;  . LEFT  HEART CATH AND CORONARY ANGIOGRAPHY N/A 11/30/2017   Procedure: LEFT HEART CATH AND CORONARY ANGIOGRAPHY;  Surgeon: Jettie Booze, MD;  Location: [facility];  Service: Cardiovascular;  Laterality: N/A;    Current Medications: Outpatient Medications Prior to Visit  Medication Sig Dispense Refill  . aspirin 81 MG tablet Take 1 tablet (81 mg total) by mouth daily. 30 tablet   . atorvastatin (LIPITOR) 80 MG tablet Take 1 tablet (80 mg total) by mouth daily at 6 PM. 90 tablet 3  . clopidogrel (PLAVIX) 75 MG tablet Take 1 tablet (75 mg total) by mouth daily. 90 tablet 3  . finasteride (PROSCAR) 5 MG tablet Take 1 tablet (5 mg total) by mouth daily. 90 tablet 3  . metoprolol succinate (TOPROL-XL) 25 MG 24 hr tablet Take 1 tablet (25 mg total) by mouth daily. 90 tablet 3  . nitroGLYCERIN (NITROSTAT) 0.4 MG SL tablet Place 1 tablet (0.4 mg total) under the tongue every 5 (five) minutes as needed for chest pain. 30 tablet 3  . ramipril (ALTACE) 2.5 MG capsule Take 1 capsule (2.5 mg total) by mouth 2 (two) times daily. 180 capsule 3  . cyclobenzaprine (FLEXERIL) 10 MG tablet Take 10 mg by mouth 3 (three) times daily.  0  . diclofenac (VOLTAREN) 25 MG EC tablet Take 25 mg by mouth 3 (three) times daily.  0  . traMADol (ULTRAM) 50 MG tablet Take 1 tablet (50 mg total) by mouth every 6 (six) hours as needed. (Patient not taking: Reported on 12/23/2017) 20 tablet 0   No facility-administered medications prior to visit.      Allergies:   Patient has no known allergies.   Social History   Socioeconomic History  . Marital status: Married    Spouse name: Not on file  . Number of children: 0  . Years of education: Not on file  . Highest education level: Not on file  Occupational History  . Occupation: retired [employer]  Social Needs  . Financial resource strain: Not on file  . Food insecurity:    Worry: Not on file    Inability: Not on file  . Transportation needs:    Medical: Not on  file    Non-medical: Not on file  Tobacco Use  . Smoking status: Never Smoker  . Smokeless tobacco: Never Used  Substance and Sexual Activity  . Alcohol use: Yes    Alcohol/week: 0.0 standard drinks    Comment: occ.  . Drug use: No  . Sexual activity: Yes  Lifestyle  . Physical activity:    Days per week: Not on file    Minutes per session: Not on file  . Stress: Not on file  Relationships  . Social connections:    Talks on phone: Not on file    Gets together: Not on file    Attends religious service: Not on file    Active member  of club or organization: Not on file    Attends meetings of clubs or organizations: Not on file    Relationship status: Not on file  Other Topics Concern  . Not on file  Social History Narrative  . Not on file     Family History:  The patient's family history includes Colon cancer in his maternal grandfather; Crohn's disease in his brother; Heart disease in his father.   ROS:   Please see the history of present illness.    ROS All other systems reviewed and are negative.   PHYSICAL EXAM:   VS:  BP 110/73   Pulse 85   Ht 6\' 3"  (1.905 m)   Wt 260 lb (117.9 kg)   BMI 32.50 kg/m    GEN: Well nourished, well developed, in no acute distress  HEENT: normal  Neck: no JVD, carotid bruits, or masses Cardiac: RRR; no murmurs, rubs, or gallops,no edema  Respiratory:  clear to auscultation bilaterally, normal work of breathing GI: soft, nontender, nondistended, + BS MS: no deformity or atrophy  Skin: warm and dry, no rash Neuro:  Alert and Oriented x 3, Strength and sensation are intact Psych: euthymic mood, full affect  Wt Readings from Last 3 Encounters:  12/23/17 260 lb (117.9 kg)  12/02/17 256 lb (116.1 kg)  12/01/17 259 lb 11.2 oz (117.8 kg)      Studies/Labs Reviewed:   EKG:  EKG is ordered today.  The ekg ordered today demonstrates NSR with q wave in anterior leads, TWI in V1-V3  Recent Labs: 11/30/2017: ALT 34; Magnesium 2.0; TSH  3.199 12/01/2017: BUN 17; Creatinine, Ser 1.20; Hemoglobin 14.3; Platelets 167; Potassium 4.0; Sodium 138   Lipid Panel    Component Value Date/Time   CHOL 133 12/01/2017 0013   CHOL 142 12/02/2016 0913   TRIG 181 (H) 12/01/2017 0013   HDL 37 (L) 12/01/2017 0013   HDL 44 12/02/2016 0913   CHOLHDL 3.6 12/01/2017 0013   VLDL 36 12/01/2017 0013   LDLCALC 60 12/01/2017 0013   LDLCALC 54 12/02/2016 0913    Additional studies/ records that were reviewed today include:   CTA of chest 11/30/2017 IMPRESSION: 1. No pulmonary emboli. 2. There appears to be an area of moderate narrowing of the left anterior descending coronary artery as described above. 3. 4 mm nodule in the anterior aspect of the right middle lobe. No follow-up needed if patient is low-risk. Non-contrast chest CT can be considered in 12 months if patient is high-risk. This recommendation follows the consensus statement: Guidelines for Management of Incidental Pulmonary Nodules Detected on CT Images: From the Fleischner Society 2017; Radiology 2017; 284:228-243.    Cath 11/30/2017  There is mild left ventricular systolic dysfunction. LVEDP 11 mm Hg.  The left ventricular ejection fraction is 45-50% by visual estimate. Anterolateral hypokinesis.  There is no aortic valve stenosis.  Prox LAD lesion is 25% stenosed.  Patent LAD stents with mild distal restenosis.  Mid LAD lesion is 40% stenosed.  Ost 1st Diag lesion is 95% stenosed. Branch of diagonal occluded with left to left collaterals.  Ost 1st Mrg lesion is 50% stenosed.  Ost Cx to Prox Cx lesion is 75% stenosed.  A drug-eluting stent was successfully placed using a STENT SYNERGY DES 3X16.  Post intervention, there is a 0% residual stenosis.  Recommend dual antiplatelet therapy with Aspirin 81mg  daily and Clopidogrel 75mg  daily long-term (beyond 12 months) because of long area of Cypher stents in the LAD. Marland Kitchen  Could consider clopidogrel monotherapy  after 1 year if there are bleeding issues.   Continue aggressive secondary prevention.     Echo 12/01/2017 LV EF: 45% -   50% Study Conclusions  - Left ventricle: The cavity size was mildly dilated. There was   moderate focal basal hypertrophy. Systolic function was mildly   reduced. The estimated ejection fraction was in the range of 45%   to 50%. There is akinesis of the apicalinferior and apical   myocardium. Features are consistent with a pseudonormal left   ventricular filling pattern, with concomitant abnormal relaxation   and increased filling pressure (grade 2 diastolic dysfunction). - Aortic valve: Trileaflet; normal thickness, mildly calcified   leaflets. - Aorta: Aortic root dimension: 38 mm (ED). - Aortic root: The aortic root was mildly dilated. - Left atrium: The atrium was mildly dilated.   ASSESSMENT:    1. Coronary artery disease involving native coronary artery of native heart without angina pectoris   2. Medication management   3. Essential hypertension   4. Hyperlipidemia LDL goal <70   5. Dilated aortic root (HCC)   6. Lung nodule < 6cm on CT      PLAN:  In order of problems listed above:  1. CAD: Recently underwent DES to left circumflex artery.  Continue aspirin and Plavix.  He denies any further chest discomfort.  He does have a back pain which was attributed to musculoskeletal issue.  2. Hypertension: Blood pressure well controlled on current therapy  3. Hyperlipidemia: On Lipitor 80 mg daily.  He will need fasting lipid panel and LFTs in 2 months  4. Dilated aortic root: Measuring 3.8 cm on the recent echocardiogram.  5. Lung nodule: Seen on recent CT, will defer to primary care provider to follow-up.    Medication Adjustments/Labs and Tests Ordered: Current medicines are reviewed at length with the patient today.  Concerns regarding medicines are outlined above.  Medication changes, Labs and Tests ordered today are listed in the Patient  Instructions below. Patient Instructions  Medication Instructions:  Your Physician recommend you continue on your current medication as directed.    If you need a refill on your cardiac medications before your next appointment, please call your pharmacy.   Lab work: Your physician recommends that you return for lab work in 2 months (Fasting lipids, BMP)  If you have labs (blood work) drawn today and your tests are completely normal, you will receive your results only by: Marland Kitchen MyChart Message (if you have MyChart) OR . A paper copy in the mail If you have any lab test that is abnormal or we need to change your treatment, we will call you to review the results.  Testing/Procedures: None  Follow-Up: At [facility], you and your health needs are our priority.  As part of our continuing mission to provide you with exceptional heart care, we have created designated Provider Care Teams.  These Care Teams include your primary Cardiologist (physician) and Advanced Practice Providers (APPs -  Physician Assistants and Nurse Practitioners) who all work together to provide you with the care you need, when you need it. You will need a follow up appointment in 3 months.  Please call our office 2 months in advance to schedule this appointment.  You may see Shelva Majestic, MD or one of the following Advanced Practice Providers on your designated Care Team: [place], [place] . Fabian Sharp, PA-C         Signed, Almyra Deforest, [place]  12/25/2017 11:26 PM    [address], [place], [place]  [address] Phone: [phone]; Fax: [phone]

## 2017-12-25 ENCOUNTER — Encounter: Payer: Self-pay | Admitting: Physician Assistant

## 2017-12-25 DIAGNOSIS — M546 Pain in thoracic spine: Secondary | ICD-10-CM | POA: Diagnosis not present

## 2018-01-06 ENCOUNTER — Encounter (INDEPENDENT_AMBULATORY_CARE_PROVIDER_SITE_OTHER): Payer: Self-pay | Admitting: Family Medicine

## 2018-01-08 ENCOUNTER — Telehealth (HOSPITAL_COMMUNITY): Payer: Self-pay

## 2018-01-08 ENCOUNTER — Ambulatory Visit (INDEPENDENT_AMBULATORY_CARE_PROVIDER_SITE_OTHER): Payer: BLUE CROSS/BLUE SHIELD | Admitting: Family Medicine

## 2018-01-08 ENCOUNTER — Encounter (INDEPENDENT_AMBULATORY_CARE_PROVIDER_SITE_OTHER): Payer: Self-pay | Admitting: Family Medicine

## 2018-01-08 DIAGNOSIS — M65312 Trigger thumb, left thumb: Secondary | ICD-10-CM | POA: Diagnosis not present

## 2018-01-08 NOTE — Progress Notes (Signed)
Office Visit Note   Patient: Brandon Frederick           Date of Birth: 23-Jan-1955           MRN: [id number] Visit Date: 01/08/2018 Requested by: Denita Lung, MD [place], [place] [address] PCP: Denita Lung, MD  Subjective: Chief Complaint  Patient presents with  . Left Thumb - Pain    Pain x "a long time" and worsening. Sticks.  Requests cortisone injection, as it has worked for other trigger fingers in the past.    HPI: He is a 63 year old right-hand-dominant male with left trigger thumb.  History of left third and fourth trigger fingers years ago treated successfully with cortisone injection.  His right hand has not been a problem for him.  A few weeks ago his left thumb started triggering and it continues to do so despite using a splint and ice.              ROS: Noncontributory  Objective: Vital Signs: There were no vitals taken for this visit.  Physical Exam:  Left thumb: Tender nodule at the A1 pulley with triggering on active and passive flexion of the IP joint.  Imaging: None today.  Assessment & Plan: 1.  Left trigger thumb -We discussed treatment options and he wants to try an injection.  Follow-up as needed.   Follow-Up Instructions: No follow-ups on file.      Procedures: Left trigger thumb injection: After sterile prep with Betadine, injected 2 cc 1% lidocaine without epinephrine and 40 mg methylprednisolone into the tender nodule at the A1 pulley.    PMFS History: Patient Active Problem List   Diagnosis Date Noted  . CAD (coronary artery disease) 12/01/2017  . Unstable angina ([place]) 11/30/2017  . Benign prostatic hyperplasia without lower urinary tract symptoms 09/09/2017  . Mild obesity 09/26/2014  . Essential hypertension 02/26/2013  . Hyperlipidemia with target LDL less than 70 08/23/2012  . Old MI (myocardial infarction) 08/23/2012  . History of melanoma 07/28/2011  . ASHD (arteriosclerotic heart disease) 07/28/2011     Past Medical History:  Diagnosis Date  . ASHD (arteriosclerotic heart disease)    stent  . Dyslipidemia   . History of stress test 09/2011   Which showed his previously documented scarring anteriorly and apically, concordant with his LAD infarction. Post-stress EF 40%  . Hx of echocardiogram    showed an EF of approximately 40% with moderate-to-severe anterior wall hypokenisis and moderate-to-severe apical wall hypokinesis. He did have mild aortic sclerosis. There was mild pulmonary hypertensin with estimated RV systolic pressure of 34 mm.  . Hyperlipidemia   . Melanoma ([place])    torso  . MI (myocardial infarction) ([place]) 2004, 2009   Large Anterior wall, at which he underwent stenting of his promixal occluded LAD with ultimate insertion of a 3.5 x 28 mm Cypher stent post dilated to 4.1 cm.    Family History  Problem Relation Age of Onset  . Heart disease Father        5 way bypass  . Colon cancer Maternal Grandfather        dx in his 73's  . Crohn's disease Brother     Past Surgical History:  Procedure Laterality Date  . CARDIAC CATHETERIZATION  06/2007    revealed a progressive disease in the mid LAD, and he underwent insertion of a new mid-LAD stents as well stenting of his PLA branch of his right coronary artery with a  2.75 x 30 mm Cypher stent post dilated 3.25 mm. He did also have some mild additional concomitant CAD.  Marland Kitchen COLONOSCOPY    . CORONARY ANGIOPLASTY WITH STENT PLACEMENT    . CORONARY STENT INTERVENTION N/A 11/30/2017   Procedure: CORONARY STENT INTERVENTION;  Surgeon: Jettie Booze, MD;  Location: [facility];  Service: Cardiovascular;  Laterality: N/A;  . LEFT HEART CATH AND CORONARY ANGIOGRAPHY N/A 11/30/2017   Procedure: LEFT HEART CATH AND CORONARY ANGIOGRAPHY;  Surgeon: Jettie Booze, MD;  Location: [facility];  Service: Cardiovascular;  Laterality: N/A;   Social History   Occupational History  . Occupation: retired [employer]   Tobacco Use  . Smoking status: Never Smoker  . Smokeless tobacco: Never Used  Substance and Sexual Activity  . Alcohol use: Yes    Alcohol/week: 0.0 standard drinks    Comment: occ.  . Drug use: No  . Sexual activity: Yes

## 2018-01-08 NOTE — Telephone Encounter (Signed)
Called patient to see if he is interested in the Cardiac Rehab Program. Patient stated not at this time. ° °Closed referral °

## 2018-02-15 DIAGNOSIS — Z79899 Other long term (current) drug therapy: Secondary | ICD-10-CM | POA: Diagnosis not present

## 2018-02-15 LAB — HEPATIC FUNCTION PANEL
ALBUMIN: 4.6 g/dL (ref 3.8–4.8)
ALT: 31 IU/L (ref 0–44)
AST: 18 IU/L (ref 0–40)
Alkaline Phosphatase: 86 IU/L (ref 39–117)
Bilirubin Total: 0.7 mg/dL (ref 0.0–1.2)
Bilirubin, Direct: 0.19 mg/dL (ref 0.00–0.40)
Total Protein: 7 g/dL (ref 6.0–8.5)

## 2018-02-15 LAB — LIPID PANEL
CHOLESTEROL TOTAL: 137 mg/dL (ref 100–199)
Chol/HDL Ratio: 3.1 ratio (ref 0.0–5.0)
HDL: 44 mg/dL (ref >39)
LDL CALC: 58 mg/dL (ref 0–99)
Triglycerides: 177 mg/dL — ABNORMAL HIGH (ref 0–149)
VLDL CHOLESTEROL CAL: 35 mg/dL (ref 5–40)

## 2018-02-17 NOTE — Progress Notes (Signed)
The patient has been notified of the result and verbalized understanding.  All questions (if any) were answered. Jacqulynn Cadet, CMA 02/17/2018 1:28 PM

## 2018-02-18 DIAGNOSIS — L570 Actinic keratosis: Secondary | ICD-10-CM | POA: Diagnosis not present

## 2018-02-18 DIAGNOSIS — Z85820 Personal history of malignant melanoma of skin: Secondary | ICD-10-CM | POA: Diagnosis not present

## 2018-02-18 DIAGNOSIS — L739 Follicular disorder, unspecified: Secondary | ICD-10-CM | POA: Diagnosis not present

## 2018-02-18 DIAGNOSIS — D0462 Carcinoma in situ of skin of left upper limb, including shoulder: Secondary | ICD-10-CM | POA: Diagnosis not present

## 2018-02-18 DIAGNOSIS — D485 Neoplasm of uncertain behavior of skin: Secondary | ICD-10-CM | POA: Diagnosis not present

## 2018-02-18 DIAGNOSIS — L819 Disorder of pigmentation, unspecified: Secondary | ICD-10-CM | POA: Diagnosis not present

## 2018-02-25 DIAGNOSIS — C44629 Squamous cell carcinoma of skin of left upper limb, including shoulder: Secondary | ICD-10-CM | POA: Diagnosis not present

## 2018-03-01 ENCOUNTER — Telehealth: Payer: Self-pay | Admitting: Cardiovascular Disease

## 2018-03-01 NOTE — Telephone Encounter (Signed)
Spoke with Dr.Kelly(DOD) he states as long as the patient did not have valve replacement and should not need premedication.

## 2018-03-01 NOTE — Telephone Encounter (Signed)
1. What dental office are you calling from? Dr Lenard Simmer   2. What is your office phone number? [phone]   3. What is your fax number?[phone]  4. What type of procedure is the patient having performed? Cleaning   5. What date is procedure scheduled or is the patient there now? 03-01-18  (if the patient is at the dentist's office question goes to their cardiologist if he/she is in the office.  If not, question should go to the DOD).   In The chair  6. What is your question (ex. Antibiotics prior to procedure, holding medication-we need to know how 7.  long dentist wants pt to hold med)?  Does pt need to be pre medicated?

## 2018-03-25 ENCOUNTER — Telehealth: Payer: Self-pay | Admitting: Cardiovascular Disease

## 2018-03-25 NOTE — Telephone Encounter (Signed)
Attempted to contact patient regarding appointment for next week, LVM advising a call back and call back number.

## 2018-03-25 NOTE — Telephone Encounter (Signed)
Per pt call - stated he is returning this office and would like to know if he should keep his appointment on 03/30/18 8:20a.    He would like a call back please.

## 2018-03-26 ENCOUNTER — Telehealth: Payer: Self-pay

## 2018-03-26 NOTE — Telephone Encounter (Signed)
Attempted to contact patient, regarding upcoming appointment for 3/24

## 2018-03-26 NOTE — Telephone Encounter (Signed)
   Called patient, LVM advising patient to call back to discuss possibly moving appointment.

## 2018-03-29 NOTE — Telephone Encounter (Signed)
Have attempted multiple times to contact patient regarding appointment for tomorrow.   Patient has no answered.

## 2018-03-30 ENCOUNTER — Ambulatory Visit: Payer: BLUE CROSS/BLUE SHIELD | Admitting: Cardiovascular Disease

## 2018-05-25 ENCOUNTER — Other Ambulatory Visit: Payer: Self-pay | Admitting: Family Medicine

## 2018-05-25 DIAGNOSIS — N400 Enlarged prostate without lower urinary tract symptoms: Secondary | ICD-10-CM

## 2018-05-25 NOTE — Telephone Encounter (Signed)
Is this okay to refill? 

## 2018-06-23 ENCOUNTER — Telehealth: Payer: Self-pay | Admitting: Physician Assistant

## 2018-06-23 NOTE — Telephone Encounter (Signed)
smartphone/ consent/ my chart/ pre reg completed °

## 2018-06-25 ENCOUNTER — Telehealth: Payer: Self-pay | Admitting: Physician Assistant

## 2018-06-25 NOTE — Telephone Encounter (Signed)
LVM for patient to confirm appt on 06-28-18 °

## 2018-06-27 NOTE — Progress Notes (Signed)
Cardiology Office Note:    Date:  06/28/2018   ID:  Brandon Frederick, DOB [birth year], MRN [id number]  PCP:  Denita Lung, MD  Cardiologist:  Shelva Majestic, MD   Referring MD: Denita Lung, MD   Chief Complaint  Patient presents with  . Follow-up    CAD    History of Present Illness:    Brandon Frederick is a 63 y.o. male with a hx of CAD, HTN, and HLD. He had an anterior MI 06/2002 treated with PCI, 2009 PCI to LAD and stent to PLA of RCA. Echo 2011 with reduced EF of 40%, myoview 2013 negative for reversible ischemia but with scar. Recent heart cath 11/30/17 with patent LAD stent with mild distal restenosis, 95% ostial D1 with left to left collaterals, 75% ostial left Cx treated with DES. Echo with EF was 45-50%, grade 2 DD, and midlly dilated aortic root (38 mm).  He was last seen in clinic by Almyra Deforest, [facility], on 12/23/17. He is maintained on ASA and plavix.   He presents today for 6 month follow up. He has no complaints. No chest pain, SOB, swelling, palpitations, or syncope. No bleeding problems on ASA and plavix. Last lipid profile was at goal.    Past Medical History:  Diagnosis Date  . ASHD (arteriosclerotic heart disease)    stent  . Dyslipidemia   . History of stress test 09/2011   Which showed his previously documented scarring anteriorly and apically, concordant with his LAD infarction. Post-stress EF 40%  . Hx of echocardiogram    showed an EF of approximately 40% with moderate-to-severe anterior wall hypokenisis and moderate-to-severe apical wall hypokinesis. He did have mild aortic sclerosis. There was mild pulmonary hypertensin with estimated RV systolic pressure of 34 mm.  . Hyperlipidemia   . Melanoma ([place])    torso  . MI (myocardial infarction) ([place]) 2004, 2009   Large Anterior wall, at which he underwent stenting of his promixal occluded LAD with ultimate insertion of a 3.5 x 28 mm Cypher stent post dilated to 4.1 cm.    Past Surgical History:  Procedure  Laterality Date  . CARDIAC CATHETERIZATION  06/2007    revealed a progressive disease in the mid LAD, and he underwent insertion of a new mid-LAD stents as well stenting of his PLA branch of his right coronary artery with a 2.75 x 30 mm Cypher stent post dilated 3.25 mm. He did also have some mild additional concomitant CAD.  Marland Kitchen COLONOSCOPY    . CORONARY ANGIOPLASTY WITH STENT PLACEMENT    . CORONARY STENT INTERVENTION N/A 11/30/2017   Procedure: CORONARY STENT INTERVENTION;  Surgeon: Jettie Booze, MD;  Location: [facility];  Service: Cardiovascular;  Laterality: N/A;  . LEFT HEART CATH AND CORONARY ANGIOGRAPHY N/A 11/30/2017   Procedure: LEFT HEART CATH AND CORONARY ANGIOGRAPHY;  Surgeon: Jettie Booze, MD;  Location: [facility];  Service: Cardiovascular;  Laterality: N/A;    Current Medications: Current Meds  Medication Sig  . aspirin 81 MG tablet Take 1 tablet (81 mg total) by mouth daily.  Marland Kitchen atorvastatin (LIPITOR) 80 MG tablet Take 1 tablet (80 mg total) by mouth daily at 6 PM.  . clopidogrel (PLAVIX) 75 MG tablet Take 1 tablet (75 mg total) by mouth daily.  . finasteride (PROSCAR) 5 MG tablet Take 1 tablet by mouth daily  . metoprolol succinate (TOPROL-XL) 25 MG 24 hr tablet Take 1 tablet (25 mg total) by mouth  daily.  . nitroGLYCERIN (NITROSTAT) 0.4 MG SL tablet Place 1 tablet (0.4 mg total) under the tongue every 5 (five) minutes as needed for chest pain.  . ramipril (ALTACE) 2.5 MG capsule Take 1 capsule (2.5 mg total) by mouth 2 (two) times daily.     Allergies:   Patient has no known allergies.   Social History   Socioeconomic History  . Marital status: Married    Spouse name: Not on file  . Number of children: 0  . Years of education: Not on file  . Highest education level: Not on file  Occupational History  . Occupation: retired [employer]  Social Needs  . Financial resource strain: Not on file  . Food insecurity    Worry: Not on file     Inability: Not on file  . Transportation needs    Medical: Not on file    Non-medical: Not on file  Tobacco Use  . Smoking status: Never Smoker  . Smokeless tobacco: Never Used  Substance and Sexual Activity  . Alcohol use: Yes    Alcohol/week: 0.0 standard drinks    Comment: occ.  . Drug use: No  . Sexual activity: Yes  Lifestyle  . Physical activity    Days per week: Not on file    Minutes per session: Not on file  . Stress: Not on file  Relationships  . Social Herbalist on phone: Not on file    Gets together: Not on file    Attends religious service: Not on file    Active member of club or organization: Not on file    Attends meetings of clubs or organizations: Not on file    Relationship status: Not on file  Other Topics Concern  . Not on file  Social History Narrative  . Not on file     Family History: The patient's family history includes Colon cancer in his maternal grandfather; Crohn's disease in his brother; Heart disease in his father.  ROS:   Please see the history of present illness.     All other systems reviewed and are negative.  EKGs/Labs/Other Studies Reviewed:    The following studies were reviewed today:  Left heart cath 11/30/17:  There is mild left ventricular systolic dysfunction. LVEDP 11 mm Hg.  The left ventricular ejection fraction is 45-50% by visual estimate. Anterolateral hypokinesis.  There is no aortic valve stenosis.  Prox LAD lesion is 25% stenosed.  Patent LAD stents with mild distal restenosis.  Mid LAD lesion is 40% stenosed.  Ost 1st Diag lesion is 95% stenosed. Branch of diagonal occluded with left to left collaterals.  Ost 1st Mrg lesion is 50% stenosed.  Ost Cx to Prox Cx lesion is 75% stenosed.  A drug-eluting stent was successfully placed using a STENT SYNERGY DES 3X16.  Post intervention, there is a 0% residual stenosis.   Recommend dual antiplatelet therapy with Aspirin 81mg  daily and  Clopidogrel 75mg  daily long-term (beyond 12 months) because of long area of Cypher stents in the LAD.   Could consider clopidogrel monotherapy after 1 year if there are bleeding issues.   Continue aggressive secondary prevention.    Echo 12/01/17: Study Conclusions  - Left ventricle: The cavity size was mildly dilated. There was   moderate focal basal hypertrophy. Systolic function was mildly   reduced. The estimated ejection fraction was in the range of 45%   to 50%. There is akinesis of the apicalinferior and apical   myocardium.  Features are consistent with a pseudonormal left   ventricular filling pattern, with concomitant abnormal relaxation   and increased filling pressure (grade 2 diastolic dysfunction). - Aortic valve: Trileaflet; normal thickness, mildly calcified   leaflets. - Aorta: Aortic root dimension: 38 mm (ED). - Aortic root: The aortic root was mildly dilated. - Left atrium: The atrium was mildly dilated.    EKG:  EKG is ordered today.  The ekg ordered today demonstrates sinus rhythm, HR 58, anteroseptal infarct, similar to prior tracings.  Recent Labs: 11/30/2017: Magnesium 2.0; TSH 3.199 12/01/2017: BUN 17; Creatinine, Ser 1.20; Hemoglobin 14.3; Platelets 167; Potassium 4.0; Sodium 138 02/15/2018: ALT 31  Recent Lipid Panel    Component Value Date/Time   CHOL 137 02/15/2018 0844   TRIG 177 (H) 02/15/2018 0844   HDL 44 02/15/2018 0844   CHOLHDL 3.1 02/15/2018 0844   CHOLHDL 3.6 12/01/2017 0013   VLDL 36 12/01/2017 0013   LDLCALC 58 02/15/2018 0844    Physical Exam:    VS:  BP 100/70   Pulse (!) 58   Temp (!) 97.5 F (36.4 C)   Ht 6\' 3"  (1.905 m)   Wt 249 lb 12.8 oz (113.3 kg)   SpO2 93%   BMI 31.22 kg/m     Wt Readings from Last 3 Encounters:  06/28/18 249 lb 12.8 oz (113.3 kg)  12/23/17 260 lb (117.9 kg)  12/02/17 256 lb (116.1 kg)     GEN:  Well nourished, well developed in no acute distress HEENT: Normal NECK: No JVD; No carotid  bruits CARDIAC: RRR, no murmurs, rubs, gallops RESPIRATORY:  Clear to auscultation without rales, wheezing or rhonchi  ABDOMEN: Soft, non-tender, non-distended MUSCULOSKELETAL:  No edema; No deformity  SKIN: Warm and dry NEUROLOGIC:  Alert and oriented x 3 PSYCHIATRIC:  Normal affect   ASSESSMENT:    1. Coronary artery disease involving native coronary artery of native heart without angina pectoris   2. Essential hypertension   3. Hyperlipidemia with target LDL less than 70    PLAN:    In order of problems listed above:  CAD DES to Cx 11/30/17 ASA and plavix, toprol, ramipril. No bleeding problems. No anginal symptoms. 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    Hypertension Medications as above. Pressures have been well-controlled, 120/80 at home. No medication changes.    Hyperlipidemia 12/01/2017: VLDL 36 02/15/2018: Cholesterol, Total 137; HDL 44; LDL Calculated 58; Triglycerides 177 Continue 80 mg lipitor.   Follow up with Dr. Claiborne Billings in 6 months, decide about DAPT at that time.    Medication Adjustments/Labs and Tests Ordered: Current medicines are reviewed at length with the patient today.  Concerns regarding medicines are outlined above.  Orders Placed This Encounter  Procedures  . EKG 12-Lead   No orders of the defined types were placed in this encounter.   Signed, Ledora Bottcher, [place]  06/28/2018 11:36 AM     [facility]

## 2018-06-28 ENCOUNTER — Telehealth: Payer: BLUE CROSS/BLUE SHIELD | Admitting: Cardiovascular Disease

## 2018-06-28 ENCOUNTER — Encounter: Payer: Self-pay | Admitting: Physician Assistant

## 2018-06-28 ENCOUNTER — Ambulatory Visit (INDEPENDENT_AMBULATORY_CARE_PROVIDER_SITE_OTHER): Payer: BC Managed Care – PPO | Admitting: Physician Assistant

## 2018-06-28 ENCOUNTER — Other Ambulatory Visit: Payer: Self-pay

## 2018-06-28 VITALS — BP 100/70 | HR 58 | Temp 97.5°F | Ht 75.0 in | Wt 249.8 lb

## 2018-06-28 DIAGNOSIS — I10 Essential (primary) hypertension: Secondary | ICD-10-CM | POA: Diagnosis not present

## 2018-06-28 DIAGNOSIS — E785 Hyperlipidemia, unspecified: Secondary | ICD-10-CM

## 2018-06-28 DIAGNOSIS — I2510 Atherosclerotic heart disease of native coronary artery without angina pectoris: Secondary | ICD-10-CM | POA: Diagnosis not present

## 2018-06-28 NOTE — Patient Instructions (Signed)
Medication Instructions:  Your physician recommends that you continue on your current medications as directed. Please refer to the Current Medication list given to you today.  If you need a refill on your cardiac medications before your next appointment, please call your pharmacy.   Follow-Up: At [facility], you and your health needs are our priority.  As part of our continuing mission to provide you with exceptional heart care, we have created designated Provider Care Teams.  These Care Teams include your primary Cardiologist (physician) and Advanced Practice Providers (APPs -  Physician Assistants and Nurse Practitioners) who all work together to provide you with the care you need, when you need it. You will need a follow up appointment in 6 months.  Please call our office 2 months in advance to schedule this appointment.  You may see Shelva Majestic, MD or one of the following Advanced Practice Providers on your designated Care Team: [place], [place] . Fabian Sharp, PA-C  Any Other Special Instructions Will Be Listed Below (If Applicable). None

## 2018-08-17 DIAGNOSIS — L821 Other seborrheic keratosis: Secondary | ICD-10-CM | POA: Diagnosis not present

## 2018-08-17 DIAGNOSIS — L570 Actinic keratosis: Secondary | ICD-10-CM | POA: Diagnosis not present

## 2018-08-17 DIAGNOSIS — Z85828 Personal history of other malignant neoplasm of skin: Secondary | ICD-10-CM | POA: Diagnosis not present

## 2018-08-17 DIAGNOSIS — D485 Neoplasm of uncertain behavior of skin: Secondary | ICD-10-CM | POA: Diagnosis not present

## 2018-08-18 DIAGNOSIS — M546 Pain in thoracic spine: Secondary | ICD-10-CM | POA: Diagnosis not present

## 2018-08-23 DIAGNOSIS — M546 Pain in thoracic spine: Secondary | ICD-10-CM | POA: Diagnosis not present

## 2018-11-04 DIAGNOSIS — M546 Pain in thoracic spine: Secondary | ICD-10-CM | POA: Diagnosis not present

## 2018-11-23 DIAGNOSIS — H52223 Regular astigmatism, bilateral: Secondary | ICD-10-CM | POA: Diagnosis not present

## 2018-11-23 DIAGNOSIS — H524 Presbyopia: Secondary | ICD-10-CM | POA: Diagnosis not present

## 2018-11-23 DIAGNOSIS — H1045 Other chronic allergic conjunctivitis: Secondary | ICD-10-CM | POA: Diagnosis not present

## 2018-11-23 DIAGNOSIS — H2513 Age-related nuclear cataract, bilateral: Secondary | ICD-10-CM | POA: Diagnosis not present

## 2018-11-23 DIAGNOSIS — Z9889 Other specified postprocedural states: Secondary | ICD-10-CM | POA: Diagnosis not present

## 2018-11-23 DIAGNOSIS — H5203 Hypermetropia, bilateral: Secondary | ICD-10-CM | POA: Diagnosis not present

## 2018-11-23 DIAGNOSIS — H04123 Dry eye syndrome of bilateral lacrimal glands: Secondary | ICD-10-CM | POA: Diagnosis not present

## 2018-12-13 ENCOUNTER — Telehealth: Payer: Self-pay

## 2018-12-13 NOTE — Telephone Encounter (Signed)
Received fax from [facility] for a refill on Finasteride, Clopidogrel, Metoprolol, Ramipril, last apt. 12/02/17.

## 2018-12-14 ENCOUNTER — Other Ambulatory Visit: Payer: Self-pay | Admitting: *Deleted

## 2018-12-14 ENCOUNTER — Other Ambulatory Visit: Payer: Self-pay

## 2018-12-14 DIAGNOSIS — I10 Essential (primary) hypertension: Secondary | ICD-10-CM

## 2018-12-14 DIAGNOSIS — I2510 Atherosclerotic heart disease of native coronary artery without angina pectoris: Secondary | ICD-10-CM

## 2018-12-14 DIAGNOSIS — N400 Enlarged prostate without lower urinary tract symptoms: Secondary | ICD-10-CM

## 2018-12-14 MED ORDER — METOPROLOL SUCCINATE ER 25 MG PO TB24: 25.0000 mg | ORAL_TABLET | Freq: Every day | ORAL | 0 refills | Status: DC

## 2018-12-14 MED ORDER — RAMIPRIL 2.5 MG PO CAPS: 2.5000 mg | ORAL_CAPSULE | Freq: Two times a day (BID) | ORAL | 0 refills | Status: DC

## 2018-12-14 MED ORDER — CLOPIDOGREL BISULFATE 75 MG PO TABS: 75.0000 mg | ORAL_TABLET | Freq: Every day | ORAL | 0 refills | Status: DC

## 2018-12-14 MED ORDER — FINASTERIDE 5 MG PO TABS: 5.0000 mg | ORAL_TABLET | Freq: Every day | ORAL | 0 refills | Status: DC

## 2018-12-14 MED ORDER — ATORVASTATIN CALCIUM 80 MG PO TABS: 80.0000 mg | ORAL_TABLET | Freq: Every day | ORAL | 1 refills | Status: DC

## 2018-12-14 NOTE — Telephone Encounter (Signed)
Rx has been sent to the pharmacy electronically. ° °

## 2018-12-14 NOTE — Telephone Encounter (Signed)
appt made and med sent in to cover until appt. [place]

## 2019-01-14 ENCOUNTER — Ambulatory Visit: Payer: BC Managed Care – PPO | Admitting: Physician Assistant

## 2019-02-09 ENCOUNTER — Encounter: Payer: Self-pay | Admitting: Family Medicine

## 2019-02-09 DIAGNOSIS — Z0000 Encounter for general adult medical examination without abnormal findings: Secondary | ICD-10-CM

## 2019-02-09 NOTE — Telephone Encounter (Signed)
Your patient 

## 2019-02-11 ENCOUNTER — Other Ambulatory Visit: Payer: Self-pay

## 2019-02-11 DIAGNOSIS — Z0000 Encounter for general adult medical examination without abnormal findings: Secondary | ICD-10-CM

## 2019-02-12 LAB — COMPREHENSIVE METABOLIC PANEL
ALT: 21 IU/L (ref 0–44)
AST: 21 IU/L (ref 0–40)
Albumin/Globulin Ratio: 2.1 (ref 1.2–2.2)
Albumin: 4.7 g/dL (ref 3.8–4.8)
Alkaline Phosphatase: 88 IU/L (ref 39–117)
BUN/Creatinine Ratio: 12 (ref 10–24)
BUN: 13 mg/dL (ref 8–27)
Bilirubin Total: 1 mg/dL (ref 0.0–1.2)
CO2: 22 mmol/L (ref 20–29)
Calcium: 9.7 mg/dL (ref 8.6–10.2)
Chloride: 105 mmol/L (ref 96–106)
Creatinine, Ser: 1.08 mg/dL (ref 0.76–1.27)
GFR calc Af Amer: 83 mL/min/{1.73_m2} (ref >59)
GFR calc non Af Amer: 72 mL/min/{1.73_m2} (ref >59)
Globulin, Total: 2.2 g/dL (ref 1.5–4.5)
Glucose: 100 mg/dL — ABNORMAL HIGH (ref 65–99)
Potassium: 4.8 mmol/L (ref 3.5–5.2)
Sodium: 143 mmol/L (ref 134–144)
Total Protein: 6.9 g/dL (ref 6.0–8.5)

## 2019-02-12 LAB — LIPID PANEL
Chol/HDL Ratio: 2.9 ratio (ref 0.0–5.0)
Cholesterol, Total: 141 mg/dL (ref 100–199)
HDL: 48 mg/dL (ref >39)
LDL Chol Calc (NIH): 74 mg/dL (ref 0–99)
Triglycerides: 105 mg/dL (ref 0–149)
VLDL Cholesterol Cal: 19 mg/dL (ref 5–40)

## 2019-02-12 LAB — CBC WITH DIFFERENTIAL/PLATELET
Basophils Absolute: 0.1 10*3/uL (ref 0.0–0.2)
Basos: 2 %
EOS (ABSOLUTE): 0.4 10*3/uL (ref 0.0–0.4)
Eos: 6 %
Hematocrit: 45.2 % (ref 37.5–51.0)
Hemoglobin: 15.7 g/dL (ref 13.0–17.7)
Immature Grans (Abs): 0 10*3/uL (ref 0.0–0.1)
Immature Granulocytes: 0 %
Lymphocytes Absolute: 1.4 10*3/uL (ref 0.7–3.1)
Lymphs: 20 %
MCH: 31.4 pg (ref 26.6–33.0)
MCHC: 34.7 g/dL (ref 31.5–35.7)
MCV: 90 fL (ref 79–97)
Monocytes Absolute: 0.9 10*3/uL (ref 0.1–0.9)
Monocytes: 12 %
Neutrophils Absolute: 4.4 10*3/uL (ref 1.4–7.0)
Neutrophils: 60 %
Platelets: 177 10*3/uL (ref 150–450)
RBC: 5 x10E6/uL (ref 4.14–5.80)
RDW: 13.5 % (ref 11.6–15.4)
WBC: 7.2 10*3/uL (ref 3.4–10.8)

## 2019-02-16 ENCOUNTER — Ambulatory Visit (INDEPENDENT_AMBULATORY_CARE_PROVIDER_SITE_OTHER): Payer: Medicare Other | Admitting: Family Medicine

## 2019-02-16 ENCOUNTER — Encounter: Payer: Self-pay | Admitting: Family Medicine

## 2019-02-16 ENCOUNTER — Other Ambulatory Visit: Payer: Self-pay

## 2019-02-16 VITALS — BP 118/82 | HR 57 | Temp 97.1°F | Ht 74.25 in | Wt 250.6 lb

## 2019-02-16 DIAGNOSIS — I2510 Atherosclerotic heart disease of native coronary artery without angina pectoris: Secondary | ICD-10-CM | POA: Diagnosis not present

## 2019-02-16 DIAGNOSIS — E669 Obesity, unspecified: Secondary | ICD-10-CM

## 2019-02-16 DIAGNOSIS — N400 Enlarged prostate without lower urinary tract symptoms: Secondary | ICD-10-CM

## 2019-02-16 DIAGNOSIS — Z23 Encounter for immunization: Secondary | ICD-10-CM

## 2019-02-16 DIAGNOSIS — N529 Male erectile dysfunction, unspecified: Secondary | ICD-10-CM

## 2019-02-16 DIAGNOSIS — E785 Hyperlipidemia, unspecified: Secondary | ICD-10-CM | POA: Diagnosis not present

## 2019-02-16 DIAGNOSIS — I10 Essential (primary) hypertension: Secondary | ICD-10-CM

## 2019-02-16 DIAGNOSIS — Z85820 Personal history of malignant melanoma of skin: Secondary | ICD-10-CM

## 2019-02-16 MED ORDER — ATORVASTATIN CALCIUM 80 MG PO TABS: 80.0000 mg | ORAL_TABLET | Freq: Every day | ORAL | 3 refills | Status: DC

## 2019-02-16 MED ORDER — FINASTERIDE 5 MG PO TABS: 5.0000 mg | ORAL_TABLET | Freq: Every day | ORAL | 3 refills | Status: DC

## 2019-02-16 MED ORDER — RAMIPRIL 2.5 MG PO CAPS: 2.5000 mg | ORAL_CAPSULE | Freq: Two times a day (BID) | ORAL | 3 refills | Status: DC

## 2019-02-16 MED ORDER — METOPROLOL SUCCINATE ER 25 MG PO TB24: 25.0000 mg | ORAL_TABLET | Freq: Every day | ORAL | 3 refills | Status: DC

## 2019-02-16 MED ORDER — CLOPIDOGREL BISULFATE 75 MG PO TABS: 75.0000 mg | ORAL_TABLET | Freq: Every day | ORAL | 3 refills | Status: DC

## 2019-02-16 MED ORDER — TADALAFIL 20 MG PO TABS: 20.0000 mg | ORAL_TABLET | Freq: Every day | ORAL | 1 refills | Status: DC | PRN

## 2019-02-16 NOTE — Progress Notes (Signed)
Brandon Frederick is a 64 y.o. male who presents for annual wellness visit and follow-up on chronic medical conditions.  He has no particular concerns or complaints.  He follows up regularly with cardiology.  He has had no difficulty with chest pain, shortness of breath, PND or DOE.  He has not had to use nitroglycerin.  He continues on Plavix and aspirin.Marland Kitchen  He is also taking metoprolol and Altace and having no difficulty with that.  He does have a history of BPH and is taking finasteride.  It is help with his urinary symptoms.  He would like medication for his underlying ED.  He tried Viagra in the past and did have difficulty with headaches.  He does have a history of melanoma does follow-up with dermatology.   Immunizations and Health Maintenance Immunization History  Administered Date(s) Administered  . Influenza Inj Mdck Quad Pf 11/14/2016  . Influenza Inj Mdck Quad With Preservative 11/05/2018  . Influenza Split 11/20/2008, 10/25/2012  . Influenza,inj,Quad PF,6+ Mos 09/26/2013, 09/13/2014, 09/09/2017  . Influenza-Unspecified 09/25/2015, 11/14/2016  . Tdap 01/06/2005, 04/08/2016  . Zoster 09/13/2014  . Zoster Recombinat (Shingrix) 04/08/2016, 07/15/2016   Health Maintenance Due  Topic Date Due  . PNA vac Low Risk Adult (1 of 2 - PCV13) 12/20/2018    Last colonoscopy: 08/08/15 Last PSA: N/A Dentist: Q six months Ophtho: 12/2018 Exercise: qd   Other doctors caring for patient include: Dr. Claiborne Billings cardio,   Advanced Directives: NOT ON FILE copy asked for Does Patient Have a Medical Advance Directive?: Yes Does patient want to make changes to medical advance directive?: No - Patient declined  Depression screen:  See questionnaire below.     Depression screen [facility] 2/9 02/16/2019 04/08/2016 09/13/2014  Decreased Interest 0 0 0  Down, Depressed, Hopeless 0 0 0  PHQ - 2 Score 0 0 0    Fall Screen: See Questionaire below.   Fall Risk  02/16/2019 04/08/2016 09/13/2014  Falls in the past year? 0  No No    ADL screen:  See questionnaire below.  Functional Status Survey: Is the patient deaf or have difficulty hearing?: No Does the patient have difficulty seeing, even when wearing glasses/contacts?: No Does the patient have difficulty concentrating, remembering, or making decisions?: No Does the patient have difficulty walking or climbing stairs?: No Does the patient have difficulty dressing or bathing?: No Does the patient have difficulty doing errands alone such as visiting a doctor's office or shopping?: No   Review of Systems  Constitutional: -, -unexpected weight change, -anorexia, -fatigue Allergy: -sneezing, -itching, -congestion Dermatology: denies changing moles, rash, lumps ENT: -runny nose, -ear pain, -sore throat,  Cardiology:  -chest pain, -palpitations, -orthopnea, Respiratory: -cough, -shortness of breath, -dyspnea on exertion, -wheezing,  Gastroenterology: -abdominal pain, -nausea, -vomiting, -diarrhea, -constipation, -dysphagia Hematology: -bleeding or bruising problems Musculoskeletal: -arthralgias, -myalgias, -joint swelling, -back pain, - Ophthalmology: -vision changes,  Urology: -dysuria, -difficulty urinating,  -urinary frequency, -urgency, incontinence Neurology: -, -numbness, , -memory loss, -falls, -dizziness    PHYSICAL EXAM:  BP 118/82 (BP Location: Left Arm, Patient Position: Sitting)   Pulse (!) 57   Temp (!) 97.1 F (36.2 C)   Ht 6' 2.25" (1.886 m)   Wt 250 lb 9.6 oz (113.7 kg)   SpO2 97%   BMI 31.96 kg/m   General Appearance: Alert, cooperative, no distress, appears stated age Head: Normocephalic, without obvious abnormality, atraumatic Eyes: PERRL, conjunctiva/corneas clear, EOM's intact, Ears: Normal TM's and external ear canals Nose: Nares normal, mucosa  normal, no drainage or sinus   tenderness Throat: Lips, mucosa, and tongue normal; teeth and gums normal Neck: Supple, no lymphadenopathy, thyroid:no  enlargement/tenderness/nodules; no carotid bruit or JVD Lungs: Clear to auscultation bilaterally without wheezes, rales or ronchi; respirations unlabored Heart: Regular rate and rhythm, S1 and S2 normal, no murmur, rub or gallop Abdomen: Soft, non-tender, nondistended, normoactive bowel sounds, no masses, no hepatosplenomegaly Skin: Skin color, texture, turgor normal, no rashes or lesions Lymph nodes: Cervical, supraclavicular, and axillary nodes normal Neurologic: CNII-XII intact, normal strength, sensation and gait; reflexes 2+ and symmetric throughout   Psych: Normal mood, affect, hygiene and grooming  ASSESSMENT/PLAN: ASHD (arteriosclerotic heart disease) - Plan: clopidogrel (PLAVIX) 75 MG tablet, metoprolol succinate (TOPROL-XL) 25 MG 24 hr tablet, ramipril (ALTACE) 2.5 MG capsule  Need for pneumococcal vaccination - Plan: Pneumococcal conjugate vaccine 13-valent  Benign prostatic hyperplasia without lower urinary tract symptoms - Plan: finasteride (PROSCAR) 5 MG tablet  Hyperlipidemia with target LDL less than 70 - Plan: atorvastatin (LIPITOR) 80 MG tablet  Essential hypertension - Plan: metoprolol succinate (TOPROL-XL) 25 MG 24 hr tablet, ramipril (ALTACE) 2.5 MG capsule  History of melanoma  Erectile dysfunction, unspecified erectile dysfunction type - Plan: tadalafil (CIALIS) 20 MG tablet  Mild obesity  Encouraged him to become more physically active.  Continue on his present medication regimen.  He will let me know how he does taking the Cialis.   recommended at least 30 minutes of aerobic activity at least 5 days/week; . Immunization recommendations discussed.  Colonoscopy recommendations reviewed.   Medicare Attestation I have personally reviewed: The patient's medical and social history Their use of alcohol, tobacco or illicit drugs Their current medications and supplements The patient's functional ability including ADLs,fall risks, home safety risks, cognitive, and  hearing and visual impairment Diet and physical activities Evidence for depression or mood disorders  The patient's weight, height, and BMI have been recorded in the chart.  I have made referrals, counseling, and provided education to the patient based on review of the above and I have provided the patient with a written personalized care plan for preventive services.     Jill Alexanders, MD   02/16/2019

## 2019-02-16 NOTE — Patient Instructions (Signed)
  Brandon Frederick , Thank you for taking time to come for your Medicare Wellness Visit. I appreciate your ongoing commitment to your health goals. Please review the following plan we discussed and let me know if I can assist you in the future.   These are the goals we discussed: Continue to take good care of yourself This is a list of the screening recommended for you and due dates:  Health Maintenance  Topic Date Due  . Pneumonia vaccines (2 of 2 - PPSV23) 02/16/2020  . Colon Cancer Screening  08/07/2025  . Tetanus Vaccine  04/09/2026  . Flu Shot  Completed  .  Hepatitis C: One time screening is recommended by Center for Disease Control  (CDC) for  adults born from [birth year] through 1967.   Completed  . HIV Screening  Completed

## 2019-02-26 ENCOUNTER — Other Ambulatory Visit: Payer: Self-pay | Admitting: Family Medicine

## 2019-02-26 DIAGNOSIS — I2510 Atherosclerotic heart disease of native coronary artery without angina pectoris: Secondary | ICD-10-CM

## 2019-02-26 DIAGNOSIS — N400 Enlarged prostate without lower urinary tract symptoms: Secondary | ICD-10-CM

## 2019-02-26 DIAGNOSIS — I10 Essential (primary) hypertension: Secondary | ICD-10-CM

## 2019-03-10 ENCOUNTER — Ambulatory Visit: Payer: Medicare Other | Attending: Internal Medicine

## 2019-03-10 DIAGNOSIS — Z23 Encounter for immunization: Secondary | ICD-10-CM | POA: Insufficient documentation

## 2019-03-10 NOTE — Progress Notes (Signed)
   Covid-19 Vaccination Clinic  Name:  HORTON RHODES    MRN: [id number] DOB: 09-21-1955  03/10/2019  Mr. Vivenzio was observed post Covid-19 immunization for 15 minutes without incident. He was provided with Vaccine Information Sheet and instruction to access the V-Safe system.   Mr. Lawhun was instructed to call 911 with any severe reactions post vaccine: Marland Kitchen Difficulty breathing  . Swelling of face and throat  . A fast heartbeat  . A bad rash all over body  . Dizziness and weakness

## 2019-04-06 ENCOUNTER — Ambulatory Visit: Payer: Medicare Other | Attending: Internal Medicine

## 2019-04-06 DIAGNOSIS — Z23 Encounter for immunization: Secondary | ICD-10-CM

## 2019-04-06 NOTE — Progress Notes (Signed)
   Covid-19 Vaccination Clinic  Name:  Brandon Frederick    MRN: [id number] DOB: 1955-08-16  04/06/2019  [name] was observed post Covid-19 immunization for 15 minutes without incident. He was provided with Vaccine Information Sheet and instruction to access the V-Safe system.   [name] was instructed to call 911 with any severe reactions post vaccine: Marland Kitchen Difficulty breathing  . Swelling of face and throat  . A fast heartbeat  . A bad rash all over body  . Dizziness and weakness   Immunizations Administered    Name Date Dose VIS Date Route   Pfizer COVID-19 Vaccine 04/06/2019  9:03 AM 0.3 mL 12/17/2018 Intramuscular   Manufacturer: [place]   Lot: [id number]   [place]: [id number]

## 2019-05-19 ENCOUNTER — Other Ambulatory Visit: Payer: Self-pay | Admitting: Family Medicine

## 2019-05-19 ENCOUNTER — Other Ambulatory Visit: Payer: Self-pay | Admitting: Cardiovascular Disease

## 2019-05-19 DIAGNOSIS — I2510 Atherosclerotic heart disease of native coronary artery without angina pectoris: Secondary | ICD-10-CM

## 2019-05-19 DIAGNOSIS — E785 Hyperlipidemia, unspecified: Secondary | ICD-10-CM

## 2019-05-19 DIAGNOSIS — I10 Essential (primary) hypertension: Secondary | ICD-10-CM

## 2019-05-19 DIAGNOSIS — N400 Enlarged prostate without lower urinary tract symptoms: Secondary | ICD-10-CM

## 2019-05-20 ENCOUNTER — Other Ambulatory Visit: Payer: Self-pay | Admitting: Family Medicine

## 2019-05-20 DIAGNOSIS — I10 Essential (primary) hypertension: Secondary | ICD-10-CM

## 2019-05-20 DIAGNOSIS — I2510 Atherosclerotic heart disease of native coronary artery without angina pectoris: Secondary | ICD-10-CM

## 2019-07-31 ENCOUNTER — Other Ambulatory Visit: Payer: Self-pay | Admitting: Family Medicine

## 2019-07-31 DIAGNOSIS — N400 Enlarged prostate without lower urinary tract symptoms: Secondary | ICD-10-CM

## 2019-07-31 DIAGNOSIS — I2510 Atherosclerotic heart disease of native coronary artery without angina pectoris: Secondary | ICD-10-CM

## 2019-07-31 DIAGNOSIS — I10 Essential (primary) hypertension: Secondary | ICD-10-CM

## 2019-08-24 ENCOUNTER — Telehealth: Payer: Self-pay | Admitting: Podiatry

## 2019-08-24 NOTE — Telephone Encounter (Signed)
Dr. Milinda Pointer did sx on my foot a couple of years ago. I have some questions about that sx. If you would please give me a call back. Thanks.

## 2019-08-30 NOTE — Telephone Encounter (Signed)
Patient wanted to update his [facility] chart with new allergies - patient has an intolerance to a couple pain meds and noted into his record

## 2019-10-27 ENCOUNTER — Other Ambulatory Visit (INDEPENDENT_AMBULATORY_CARE_PROVIDER_SITE_OTHER): Payer: Medicare Other

## 2019-10-27 ENCOUNTER — Other Ambulatory Visit: Payer: Self-pay

## 2019-10-27 DIAGNOSIS — Z23 Encounter for immunization: Secondary | ICD-10-CM | POA: Diagnosis not present

## 2019-11-03 ENCOUNTER — Other Ambulatory Visit: Payer: Self-pay | Admitting: Family Medicine

## 2019-11-03 ENCOUNTER — Other Ambulatory Visit: Payer: Self-pay | Admitting: Cardiovascular Disease

## 2019-11-03 DIAGNOSIS — E785 Hyperlipidemia, unspecified: Secondary | ICD-10-CM

## 2019-11-03 DIAGNOSIS — N400 Enlarged prostate without lower urinary tract symptoms: Secondary | ICD-10-CM

## 2019-11-03 DIAGNOSIS — I2510 Atherosclerotic heart disease of native coronary artery without angina pectoris: Secondary | ICD-10-CM

## 2019-11-03 DIAGNOSIS — I10 Essential (primary) hypertension: Secondary | ICD-10-CM

## 2019-12-05 ENCOUNTER — Ambulatory Visit (INDEPENDENT_AMBULATORY_CARE_PROVIDER_SITE_OTHER): Payer: Medicare Other | Admitting: Cardiovascular Disease

## 2019-12-05 ENCOUNTER — Other Ambulatory Visit: Payer: Self-pay

## 2019-12-05 ENCOUNTER — Encounter: Payer: Self-pay | Admitting: Cardiovascular Disease

## 2019-12-05 VITALS — BP 131/91 | HR 55 | Ht 75.0 in | Wt 254.0 lb

## 2019-12-05 DIAGNOSIS — E785 Hyperlipidemia, unspecified: Secondary | ICD-10-CM

## 2019-12-05 DIAGNOSIS — I2510 Atherosclerotic heart disease of native coronary artery without angina pectoris: Secondary | ICD-10-CM

## 2019-12-05 DIAGNOSIS — E669 Obesity, unspecified: Secondary | ICD-10-CM | POA: Diagnosis not present

## 2019-12-05 DIAGNOSIS — I10 Essential (primary) hypertension: Secondary | ICD-10-CM

## 2019-12-05 NOTE — Patient Instructions (Signed)
Medication Instructions:  No changes *If you need a refill on your cardiac medications before your next appointment, please call your pharmacy*   Lab Work: None ordered If you have labs (blood work) drawn today and your tests are completely normal, you will receive your results only by: Marland Kitchen MyChart Message (if you have MyChart) OR . A paper copy in the mail If you have any lab test that is abnormal or we need to change your treatment, we will call you to review the results.   Testing/Procedures: None ordered   Follow-Up: At [facility], you and your health needs are our priority.  As part of our continuing mission to provide you with exceptional heart care, we have created designated Provider Care Teams.  These Care Teams include your primary Cardiologist (physician) and Advanced Practice Providers (APPs -  Physician Assistants and Nurse Practitioners) who all work together to provide you with the care you need, when you need it.  We recommend signing up for the patient portal called "MyChart".  Sign up information is provided on this After Visit Summary.  MyChart is used to connect with patients for Virtual Visits (Telemedicine).  Patients are able to view lab/test results, encounter notes, upcoming appointments, etc.  Non-urgent messages can be sent to your provider as well.   To learn more about what you can do with MyChart, go to [email or web address].    Your next appointment:   12 month(s)  The format for your next appointment:   In Person  Provider:   Shelva Majestic, MD  Your physician wants you to follow-up in: 12 months. You will receive a reminder letter in the mail two months in advance. If you don't receive a letter, please call our office to schedule the follow-up appointment.  Other Instructions None

## 2019-12-05 NOTE — Progress Notes (Signed)
Patient ID: Brandon Frederick, male   DOB: [birth year], 64 y.o.   MRN: [id number]      HPI: Brandon Frederick is a 64 y.o. male who presents to the office for a 3 year cardiology evaluation.  [name]  suffered a large anterior wall myocardial infarction in June 2004 due to total occlusion of his proximal LAD. He underwent successful stenting of his proximal LAD with a 3.5x20 mm Cypher stent postdilated to 4.79m.  In June 2009 he was found to have progressive disease in the mid LAD and an additional 2.75x18 mm Cypher stent was placed, postdilated to 3.35 to 3.25 taper. In addition, a 2.75x13 mm stent was placed in the PLA branch of his RCA postdilated 3.25 mm. Over the last several years he has remained stable without recurrent chest pain. His last laboratory in March 2014 showed excellent cholesterol at 133 triglycerides 132 HDL 54 LDL 53. An echo Doppler study in 2011 showed an ejection fraction of 40% with mild aortic valve sclerosis with anterior apical moderate to severe hypokinesis. His last nuclear perfusion study in September 2013 demonstrated distal apical anterior scar without ischemia.   I last saw him in November 2018.  At that time he had remained stable since his prior to evaluation.  He specifically denied any exertional chest pain, PND, orthopnea, or palpitations.  He has continued to be on aspirin and Plavix for dual antiplatelet therapy.  He is on ramipril 2.5 mg, Toprol-XL 25 mg for his CAD and hypertension.  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  He has continued to be on atorvastatin 40 mg daily for hyperlipidemia, and Proscar 5 mg daily.  At that time I discussed potential weight loss with his BMI of 32.5.  Over the past several years, Mr. SMarletteapparently has been seen by several of the [facility] providers.  He developed recurrent chest pain in November 2019.  Repeat cardiac catheterization performed by Dr. BIllene Bolus showed patent LAD stents.  There was a 95% first diagonal stenosis and a branch of the diagonal was occluded with left to left collaterals.  There was a 75% ostial to proximal circumflex stenosis and he underwent successful insertion of a Synergy DES 3.0 x 16 mm stent to the circumflex vessel.  Was recommended that he continue long-term clopidogrel due to the long area of Cypher stents in his LAD.  An echo Doppler study during that hospitalization showed an EF at 45 to 50% with previously noted akinesis in the apical inferior and apical wall.  There was grade 2 diastolic dysfunction.  Aortic root dimension measured 38 mm.  The left atrium was moderately dilated.  Over the past several years, Mr. SCreppelhas remained stable.  He denies recurrent anginal symptomatology.  He continues to be on atorvastatin 80 mg for hyperlipidemia.  He is on aspirin and Plavix without bleeding.  He continues to be on ramipril 2.5 mg twice a day metoprolol succinate 25 mg daily. He presents for evaluation.   Past Medical History:  Diagnosis Date  . ASHD (arteriosclerotic heart disease)    stent  . Dyslipidemia   . History of stress test 09/2011   Which showed his previously documented scarring anteriorly and apically, concordant with his LAD infarction. Post-stress EF 40%  . Hx of echocardiogram    showed an EF of approximately 40% with moderate-to-severe anterior wall hypokenisis and moderate-to-severe apical wall hypokinesis. He  did have mild aortic sclerosis. There was mild pulmonary hypertensin with estimated RV systolic pressure of 34 mm.  . Hyperlipidemia   . Melanoma ([place])    torso  . MI (myocardial infarction) ([place]) 2004, 2009   Large Anterior wall, at which he underwent stenting of his promixal occluded LAD with ultimate insertion of a 3.5 x 28 mm Cypher stent post dilated to 4.1 cm.    Past Surgical History:  Procedure Laterality Date  . CARDIAC CATHETERIZATION  06/2007    revealed a progressive disease  in the mid LAD, and he underwent insertion of a new mid-LAD stents as well stenting of his PLA branch of his right coronary artery with a 2.75 x 30 mm Cypher stent post dilated 3.25 mm. He did also have some mild additional concomitant CAD.  [facility] COLONOSCOPY    . CORONARY ANGIOPLASTY WITH STENT PLACEMENT    . CORONARY STENT INTERVENTION N/A 11/30/2017   Procedure: CORONARY STENT INTERVENTION;  Surgeon: Jettie Booze, MD;  Location: [facility];  Service: Cardiovascular;  Laterality: N/A;  . LEFT HEART CATH AND CORONARY ANGIOGRAPHY N/A 11/30/2017   Procedure: LEFT HEART CATH AND CORONARY ANGIOGRAPHY;  Surgeon: Jettie Booze, MD;  Location: [facility];  Service: Cardiovascular;  Laterality: N/A;    Allergies  Allergen Reactions  . Dilaudid [Hydromorphone]     Patient states doesn't work for him  . Hydrocodone-Acetaminophen     Patient states doesn't work for him  **Can only take Percocet for pain**    Current Outpatient Medications  Medication Sig Dispense Refill  . aspirin 81 MG tablet Take 1 tablet (81 mg total) by mouth daily. 30 tablet   . atorvastatin (LIPITOR) 80 MG tablet TAKE 1 TABLET DAILY AT 6PM 90 tablet 1  . clopidogrel (PLAVIX) 75 MG tablet TAKE 1 TABLET DAILY 90 tablet 0  . finasteride (PROSCAR) 5 MG tablet TAKE 1 TABLET DAILY 90 tablet 0  . metoprolol succinate (TOPROL-XL) 25 MG 24 hr tablet TAKE 1 TABLET DAILY 90 tablet 0  . nitroGLYCERIN (NITROSTAT) 0.4 MG SL tablet Place 1 tablet (0.4 mg total) under the tongue every 5 (five) minutes as needed for chest pain. 30 tablet 3  . ramipril (ALTACE) 2.5 MG capsule TAKE 1 CAPSULE TWICE DAILY 180 capsule 0  . tadalafil (CIALIS) 20 MG tablet Take 1 tablet (20 mg total) by mouth daily as needed for erectile dysfunction. 30 tablet 1   No current facility-administered medications for this visit.    Social History   Socioeconomic History  . Marital status: Married    Spouse name: Not on file  . Number  of children: 0  . Years of education: Not on file  . Highest education level: Not on file  Occupational History  . Occupation: retired [employer]  Tobacco Use  . Smoking status: Never Smoker  . Smokeless tobacco: Never Used  Vaping Use  . Vaping Use: Never used  Substance and Sexual Activity  . Alcohol use: Yes    Alcohol/week: 0.0 standard drinks    Comment: occ.  . Drug use: No  . Sexual activity: Yes  Other Topics Concern  . Not on file  Social History Narrative  . Not on file   Social Determinants of Health   Financial Resource Strain:   . Difficulty of Paying Living Expenses: Not on file  Food Insecurity:   . Worried About Charity fundraiser in the Last Year: Not on file  . Ran  Out of Food in the Last Year: Not on file  Transportation Needs:   . Lack of Transportation (Medical): Not on file  . Lack of Transportation (Non-Medical): Not on file  Physical Activity:   . Days of Exercise per Week: Not on file  . Minutes of Exercise per Session: Not on file  Stress:   . Feeling of Stress : Not on file  Social Connections:   . Frequency of Communication with Friends and Family: Not on file  . Frequency of Social Gatherings with Friends and Family: Not on file  . Attends Religious Services: Not on file  . Active Member of Clubs or Organizations: Not on file  . Attends Archivist Meetings: Not on file  . Marital Status: Not on file  Intimate Partner Violence:   . Fear of Current or Ex-Partner: Not on file  . Emotionally Abused: Not on file  . Physically Abused: Not on file  . Sexually Abused: Not on file   Socially he is married with no children. There is no tobacco use. He does drink occasional alcohol. He recently bought a home  in [place] has been spending time at the [place] history is notable that both parents are alive, mother age 72 father age 24. He is a brother age 4 and a sister age 55. Maternal grandmother suffered an MI and died at  28  ROS General: Negative; No fevers, chills, or night sweats;  HEENT: Negative; No changes in vision or hearing, sinus congestion, difficulty swallowing Pulmonary: Negative; No cough, wheezing, shortness of breath, hemoptysis Cardiovascular: Negative; No chest pain, presyncope, syncope, palpitations GI: Negative; No nausea, vomiting, diarrhea, or abdominal pain GU: Negative; No dysuria, hematuria, or difficulty voiding Musculoskeletal: Negative; no myalgias, joint pain, or weakness Hematologic/Oncology: Negative; no easy bruising, bleeding Endocrine: Negative; no heat/cold intolerance; no diabetes Neuro: Transient right lateral thigh paresthesias; no changes in balance, headaches Skin: Negative; No rashes or skin lesions Psychiatric: Negative; No behavioral problems, depression Sleep: Negative; No snoring, daytime sleepiness, hypersomnolence, bruxism, restless legs, hypnogognic hallucinations, no cataplexy Other comprehensive 14 point system review is negative.   PE BP (!) 131/91   Pulse (!) 55   Ht '6\' 3"'  (1.905 m)   Wt 254 lb (115.2 kg)   BMI 31.75 kg/m    Repeat blood pressure by me 124/78  Wt Readings from Last 3 Encounters:  12/05/19 254 lb (115.2 kg)  02/16/19 250 lb 9.6 oz (113.7 kg)  06/28/18 249 lb 12.8 oz (113.3 kg)  . General: Alert, oriented, no distress.  Skin: normal turgor, no rashes, warm and dry HEENT: Normocephalic, atraumatic. Pupils equal round and reactive to light; sclera anicteric; extraocular muscles intact;  Nose without nasal septal hypertrophy Mouth/Parynx benign; Mallinpatti scale 3 Neck: No JVD, no carotid bruits; normal carotid upstroke Lungs: clear to ausculatation and percussion; no wheezing or rales Chest wall: without tenderness to palpitation Heart: PMI not displaced, RRR, s1 s2 normal, 1/6 systolic murmur, no diastolic murmur, no rubs, gallops, thrills, or heaves Abdomen: soft, nontender; no hepatosplenomehaly, BS+; abdominal aorta  nontender and not dilated by palpation. Back: no CVA tenderness Pulses 2+ Musculoskeletal: full range of motion, normal strength, no joint deformities Extremities: no clubbing cyanosis or edema, Homan's sign negative  Neurologic: grossly nonfocal; Cranial nerves grossly wnl Psychologic: Normal mood and affect  ECG (independently read by me): Sinus bradycardia at 55,1st degree block, PR 216 msec, QS V1-4 unchanged c/w prior Anterior MI  November 2018 ECG (independently read  by me): Normal sinus rhythm at 65 bpm.  First degree AV block.  QTc interval 426 ms.  PR interval 216 ms.  QS complex V1 through V3 consistent with prior anteroseptal MI.  November 2017 ECG (independently read by me): Sinus bradycardia with first-degree AV block.  Ventricular rate 57 bpm.  PR interval 214 ms.  QS complex consistent with prior anteroseptal MI.  September 2016 ECG (independently read by me): , sinus bradycardia 54 bpm.  Anterior Q waves V1 through V4 concordant with his old anterior MI.  Normal intervals.    August 2015 ECG (independently read by me): Sinus rhythm at 64 beats per minute.  Old anterior wall myocardial infarction.  QTc interval 429 ms. ,  Nonspecific T. change  02/24/2013 ECG (independently read by me): Sinus rhythm at 60 beats per minute. Anterior Q waves V1 through V4 compatible with his prior anterior wall myocardial infarction. QTc interval 470.  Prior ECG of 08/23/2012: Sinus rhythm at 66 beats per minute. Perineural 184 ms. Poor anterograde progression compatible with his old injury or myocardial infarction.  LABS: BMP Latest Ref Rng & Units 02/11/2019 12/01/2017 11/30/2017  Glucose 65 - 99 mg/dL 100(H) 95 102(H)  BUN 8 - 27 mg/dL '13 17 17  ' Creatinine 0.76 - 1.27 mg/dL 1.08 1.20 1.34(H)  BUN/Creat Ratio 10 - 24 12 - -  Sodium 134 - 144 mmol/L 143 138 140  Potassium 3.5 - 5.2 mmol/L 4.8 4.0 4.0  Chloride 96 - 106 mmol/L 105 108 107  CO2 20 - 29 mmol/L '22 23 27  ' Calcium 8.6 - 10.2  mg/dL 9.7 8.8(L) 9.6   Hepatic Function Latest Ref Rng & Units 02/11/2019 02/15/2018 11/30/2017  Total Protein 6.0 - 8.5 g/dL 6.9 7.0 6.5  Albumin 3.8 - 4.8 g/dL 4.7 4.6 3.9  AST 0 - 40 IU/L '21 18 23  ' ALT 0 - 44 IU/L 21 31 34  Alk Phosphatase 39 - 117 IU/L 88 86 67  Total Bilirubin 0.0 - 1.2 mg/dL 1.0 0.7 0.8  Bilirubin, Direct 0.00 - 0.40 mg/dL - 0.19 0.2   CBC Latest Ref Rng & Units 02/11/2019 12/01/2017 11/30/2017  WBC 3.4 - 10.8 x10E3/uL 7.2 8.4 7.0  Hemoglobin 13.0 - 17.7 g/dL 15.7 14.3 16.7  Hematocrit 37.5 - 51.0 % 45.2 44.4 51.3  Platelets 150 - 450 x10E3/uL 177 167 190   Lab Results  Component Value Date   MCV 90 02/11/2019   MCV 89.7 12/01/2017   MCV 91.4 11/30/2017   Lab Results  Component Value Date   TSH 3.199 11/30/2017   Lab Results  Component Value Date   HGBA1C 5.6 11/30/2017    Lipid Panel     Component Value Date/Time   CHOL 141 02/11/2019 1100   TRIG 105 02/11/2019 1100   HDL 48 02/11/2019 1100   CHOLHDL 2.9 02/11/2019 1100   CHOLHDL 3.6 12/01/2017 0013   VLDL 36 12/01/2017 0013   LDLCALC 74 02/11/2019 1100   RADIOLOGY: No results found.  IMPRESSION:   1. Coronary artery disease involving native coronary artery of native heart without angina pectoris   2. Essential hypertension   3. Hyperlipidemia with target LDL less than 70   4. Mild obesity     ASSESSMENT AND PLAN: Mr. Falletta is  a 64 year old white male who suffered a large anterior wall myocardial infarction in June 2004 at which time he  underwent stenting of his proximal occluded LAD. In June 2009 a new LAD stent was inserted beyond the  initially placed stent and he also underwent stenting of his PLA branch.  Since his last evaluation with me, he developed recurrent chest pain and underwent repeat catheterization on November 30, 2017 and underwent stenting of his very proximal left circumflex vessel.  There was evidence for left to left collateralization to his diagonal vessel and his LAD  stent was widely patent.  Presently he is without recurrent anginal symptomatology.  His blood pressure on repeat by me today was stable at 124/78.  He continues to be on metoprolol succinate 25 mg daily in addition to ramipril 5 mg daily.  I have recommended long-term DAPT particularly with his multiple areas of stenting and concomitant CAD.  He continues to be on atorvastatin 80 mg daily for hyperlipidemia.  Target LDL is less than 70.  LDL was 74 in February 2021.  He will be having repeat laboratory with his primary physician early 2022.  He continues to be active working on his cars and he currently is remodeling a 1958 Thunderbird.  We discussed continued exercise and American Heart Association recommendations of release 5 days/week for at least 30 minutes of moderate intensity if at all possible.  As long as he remains stable I will see him in 1 year for reevaluation or sooner as needed.   Troy Sine, MD, [facility]  12/05/2019 6:52 PM

## 2020-01-16 ENCOUNTER — Other Ambulatory Visit: Payer: Self-pay | Admitting: Family Medicine

## 2020-01-16 DIAGNOSIS — I2510 Atherosclerotic heart disease of native coronary artery without angina pectoris: Secondary | ICD-10-CM

## 2020-01-16 DIAGNOSIS — I10 Essential (primary) hypertension: Secondary | ICD-10-CM

## 2020-01-16 DIAGNOSIS — N400 Enlarged prostate without lower urinary tract symptoms: Secondary | ICD-10-CM

## 2020-02-17 ENCOUNTER — Ambulatory Visit: Payer: Medicare Other | Admitting: Family Medicine

## 2020-03-20 ENCOUNTER — Encounter: Payer: Self-pay | Admitting: Family Medicine

## 2020-03-20 DIAGNOSIS — N529 Male erectile dysfunction, unspecified: Secondary | ICD-10-CM

## 2020-03-21 MED ORDER — TADALAFIL 20 MG PO TABS: 20.0000 mg | ORAL_TABLET | Freq: Every day | ORAL | 1 refills | Status: DC | PRN

## 2020-04-07 ENCOUNTER — Other Ambulatory Visit: Payer: Self-pay | Admitting: Cardiovascular Disease

## 2020-04-07 ENCOUNTER — Other Ambulatory Visit: Payer: Self-pay | Admitting: Family Medicine

## 2020-04-07 DIAGNOSIS — N400 Enlarged prostate without lower urinary tract symptoms: Secondary | ICD-10-CM

## 2020-04-07 DIAGNOSIS — I2510 Atherosclerotic heart disease of native coronary artery without angina pectoris: Secondary | ICD-10-CM

## 2020-04-07 DIAGNOSIS — I10 Essential (primary) hypertension: Secondary | ICD-10-CM

## 2020-04-07 DIAGNOSIS — E785 Hyperlipidemia, unspecified: Secondary | ICD-10-CM

## 2020-04-27 ENCOUNTER — Encounter: Payer: Self-pay | Admitting: Family Medicine

## 2020-04-27 ENCOUNTER — Ambulatory Visit (INDEPENDENT_AMBULATORY_CARE_PROVIDER_SITE_OTHER): Payer: Medicare Other | Admitting: Family Medicine

## 2020-04-27 ENCOUNTER — Other Ambulatory Visit: Payer: Self-pay

## 2020-04-27 VITALS — BP 110/76 | HR 57 | Temp 97.9°F | Ht 73.0 in | Wt 253.8 lb

## 2020-04-27 DIAGNOSIS — Z23 Encounter for immunization: Secondary | ICD-10-CM

## 2020-04-27 DIAGNOSIS — E669 Obesity, unspecified: Secondary | ICD-10-CM | POA: Diagnosis not present

## 2020-04-27 DIAGNOSIS — Z0000 Encounter for general adult medical examination without abnormal findings: Secondary | ICD-10-CM | POA: Diagnosis not present

## 2020-04-27 DIAGNOSIS — E785 Hyperlipidemia, unspecified: Secondary | ICD-10-CM

## 2020-04-27 DIAGNOSIS — N400 Enlarged prostate without lower urinary tract symptoms: Secondary | ICD-10-CM

## 2020-04-27 DIAGNOSIS — Z85820 Personal history of malignant melanoma of skin: Secondary | ICD-10-CM

## 2020-04-27 DIAGNOSIS — I2510 Atherosclerotic heart disease of native coronary artery without angina pectoris: Secondary | ICD-10-CM

## 2020-04-27 DIAGNOSIS — I10 Essential (primary) hypertension: Secondary | ICD-10-CM

## 2020-04-27 DIAGNOSIS — Z2821 Immunization not carried out because of patient refusal: Secondary | ICD-10-CM

## 2020-04-27 MED ORDER — ATORVASTATIN CALCIUM 80 MG PO TABS: ORAL_TABLET | ORAL | 3 refills | Status: DC

## 2020-04-27 MED ORDER — RAMIPRIL 2.5 MG PO CAPS: 2.5000 mg | ORAL_CAPSULE | Freq: Two times a day (BID) | ORAL | 3 refills | Status: DC

## 2020-04-27 MED ORDER — METOPROLOL SUCCINATE ER 25 MG PO TB24: 1.0000 | ORAL_TABLET | Freq: Every day | ORAL | 3 refills | Status: DC

## 2020-04-27 MED ORDER — FINASTERIDE 5 MG PO TABS: 5.0000 mg | ORAL_TABLET | Freq: Every day | ORAL | 3 refills | Status: DC

## 2020-04-27 MED ORDER — CLOPIDOGREL BISULFATE 75 MG PO TABS: 1.0000 | ORAL_TABLET | Freq: Every day | ORAL | 3 refills | Status: DC

## 2020-04-27 NOTE — Progress Notes (Signed)
Brandon Frederick is a 65 y.o. male who presents for annual wellness visit and follow-up on chronic medical conditions.  He has underlying cardiovascular disease and has had a stent x4.  He is followed regularly by cardiology.  He does keep himself quite active but has not seen any change in his weight.  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  He continues on atorvastatin, Plavix, Proscar, metoprolol and Altace without difficulty.  He sees a dermatologist every 6 months for recheck on his previous history of melanoma.  He uses Cialis on an as-needed basis.  His marriage is going quite well.   Immunizations and Health Maintenance Immunization History  Administered Date(s) Administered  . Fluad Quad(high Dose 65+) 10/27/2019  . Influenza Inj Mdck Quad Pf 11/14/2016  . Influenza Inj Mdck Quad With Preservative 11/05/2018  . Influenza Split 11/20/2008, 10/25/2012  . Influenza,inj,Quad PF,6+ Mos 09/26/2013, 09/13/2014, 09/09/2017  . Influenza-Unspecified 09/25/2015, 11/14/2016  . PFIZER(Purple Top)SARS-COV-2 Vaccination 03/10/2019, 04/06/2019  . Pneumococcal Conjugate-13 02/16/2019  . Tdap 01/06/2005, 04/08/2016  . Zoster 09/13/2014  . Zoster Recombinat (Shingrix) 04/08/2016, 07/15/2016   Health Maintenance Due  Topic Date Due  . COVID-19 Vaccine (3 - Booster for Pfizer series) 10/06/2019  . PNA vac Low Risk Adult (2 of 2 - PPSV23) 02/16/2020    Last colonoscopy: 08/08/15 Last PSA: N/A Dentist:Q six months Ophtho:Q year Exercise: working out in shop daily  Other doctors caring for patient include: Dr. Claiborne Billings cardiology, Dr Modena Nunnery derma  Advanced Directives: Does Patient Have a Medical Advance Directive?: Yes Type of Advance Directive: Living will Copy asked for Depression screen:  See questionnaire below.     Depression screen [facility] 2/9 04/27/2020 02/16/2019 04/08/2016 09/13/2014  Decreased  Interest 0 0 0 0  Down, Depressed, Hopeless 0 0 0 0  PHQ - 2 Score 0 0 0 0    Fall Screen: See Questionaire below.   Fall Risk  04/27/2020 02/16/2019 04/08/2016 09/13/2014  Falls in the past year? 0 0 No No  Number falls in past yr: 0 - - -  Injury with Fall? 0 - - -  Risk for fall due to : No Fall Risks - - -  Follow up Falls evaluation completed - - -    ADL screen:  See questionnaire below.  Functional Status Survey: Is the patient deaf or have difficulty hearing?: No Does the patient have difficulty seeing, even when wearing glasses/contacts?: No Does the patient have difficulty concentrating, remembering, or making decisions?: No Does the patient have difficulty walking or climbing stairs?: No Does the patient have difficulty dressing or bathing?: No Does the patient have difficulty doing errands alone such as visiting a doctor's office or shopping?: No   Review of Systems  Constitutional: -, -unexpected weight change, -anorexia, -fatigue Allergy: -sneezing, -itching, -congestion Dermatology: denies changing moles, rash, lumps ENT: -runny nose, -ear pain, -sore throat,  Cardiology:  -chest pain, -palpitations, -orthopnea, Respiratory: -cough, -shortness of breath, -dyspnea on exertion, -wheezing,  Gastroenterology: -abdominal pain, -nausea, -vomiting, -diarrhea, -constipation, -dysphagia Hematology: -bleeding or bruising problems Musculoskeletal: -arthralgias, -myalgias, -joint swelling, -back pain, - Ophthalmology: -vision changes,  Urology: -dysuria, -difficulty urinating,  -urinary frequency, -urgency, incontinence Neurology: -, -numbness, , -memory loss, -falls, -dizziness    PHYSICAL EXAM:  General Appearance: Alert, cooperative, no distress, appears stated age Head: Normocephalic, without obvious abnormality, atraumatic Eyes: PERRL, conjunctiva/corneas clear, EOM's intact. Ears: Normal TM's  and external ear canals Nose: Nares normal, mucosa normal, no drainage or  sinus   tenderness Throat: Lips, mucosa, and tongue normal; teeth and gums normal Neck: Supple, no lymphadenopathy, thyroid:no enlargement/tenderness/nodules; no carotid bruit or JVD Lungs: Clear to auscultation bilaterally without wheezes, rales or ronchi; respirations unlabored Heart: Regular rate and rhythm, S1 and S2 normal, no murmur, rub or gallop Abdomen: Soft, non-tender, nondistended, normoactive bowel sounds, no masses, no hepatosplenomegaly Extremities: No clubbing, cyanosis or edema Pulses: 2+ and symmetric all extremities Skin: Skin color, texture, turgor normal, no rashes or lesions Lymph nodes: Cervical, supraclavicular, and axillary nodes normal Neurologic: CNII-XII intact, normal strength, sensation and gait; reflexes 2+ and symmetric throughout   Psych: Normal mood, affect, hygiene and grooming  ASSESSMENT/PLAN: ASHD (arteriosclerotic heart disease)  Hyperlipidemia with target LDL less than 70 - Plan: Lipid panel  Essential hypertension - Plan: CBC with Differential/Platelet, Comprehensive metabolic panel  Mild obesity  Benign prostatic hyperplasia without lower urinary tract symptoms  History of melanoma  Immunization refused  Need for vaccination against Streptococcus pneumoniae - Plan: Pneumococcal polysaccharide vaccine 23-valent greater than or equal to 2yo subcutaneous/IM     recommended at least 30 minutes of aerobic activity at least 5 days/week healthy diet and alcohol recommendations (less than or equal to 2 drinks/day) reviewed;Immunization recommendations discussed.  He has had 2 COVID vaccines but is not interested in getting the third 1.  Expresses concerns over the fact that he thinks it is political.  Colonoscopy recommendations reviewed.   Medicare Attestation I have personally reviewed: The patient's medical and social history Their use of alcohol, tobacco or illicit drugs Their current medications and supplements The patient's functional  ability including ADLs,fall risks, home safety risks, cognitive, and hearing and visual impairment Diet and physical activities Evidence for depression or mood disorders  The patient's weight, height, and BMI have been recorded in the chart.  I have made referrals, counseling, and provided education to the patient based on review of the above and I have provided the patient with a written personalized care plan for preventive services.     Jill Alexanders, MD   04/27/2020

## 2020-04-28 LAB — LIPID PANEL
Chol/HDL Ratio: 3 ratio (ref 0.0–5.0)
Cholesterol, Total: 142 mg/dL (ref 100–199)
HDL: 48 mg/dL (ref >39)
LDL Chol Calc (NIH): 67 mg/dL (ref 0–99)
Triglycerides: 156 mg/dL — ABNORMAL HIGH (ref 0–149)
VLDL Cholesterol Cal: 27 mg/dL (ref 5–40)

## 2020-04-28 LAB — CBC WITH DIFFERENTIAL/PLATELET
Basophils Absolute: 0.1 10*3/uL (ref 0.0–0.2)
Basos: 2 %
EOS (ABSOLUTE): 0.3 10*3/uL (ref 0.0–0.4)
Eos: 4 %
Hematocrit: 47.7 % (ref 37.5–51.0)
Hemoglobin: 15.9 g/dL (ref 13.0–17.7)
Immature Grans (Abs): 0 10*3/uL (ref 0.0–0.1)
Immature Granulocytes: 0 %
Lymphocytes Absolute: 1.3 10*3/uL (ref 0.7–3.1)
Lymphs: 17 %
MCH: 30.9 pg (ref 26.6–33.0)
MCHC: 33.3 g/dL (ref 31.5–35.7)
MCV: 93 fL (ref 79–97)
Monocytes Absolute: 0.8 10*3/uL (ref 0.1–0.9)
Monocytes: 11 %
Neutrophils Absolute: 5.2 10*3/uL (ref 1.4–7.0)
Neutrophils: 66 %
Platelets: 182 10*3/uL (ref 150–450)
RBC: 5.14 x10E6/uL (ref 4.14–5.80)
RDW: 13.4 % (ref 11.6–15.4)
WBC: 7.7 10*3/uL (ref 3.4–10.8)

## 2020-04-28 LAB — COMPREHENSIVE METABOLIC PANEL
ALT: 32 IU/L (ref 0–44)
AST: 24 IU/L (ref 0–40)
Albumin/Globulin Ratio: 1.9 (ref 1.2–2.2)
Albumin: 4.7 g/dL (ref 3.8–4.8)
Alkaline Phosphatase: 92 IU/L (ref 44–121)
BUN/Creatinine Ratio: 15 (ref 10–24)
BUN: 16 mg/dL (ref 8–27)
Bilirubin Total: 1.1 mg/dL (ref 0.0–1.2)
CO2: 18 mmol/L — ABNORMAL LOW (ref 20–29)
Calcium: 9.7 mg/dL (ref 8.6–10.2)
Chloride: 101 mmol/L (ref 96–106)
Creatinine, Ser: 1.1 mg/dL (ref 0.76–1.27)
Globulin, Total: 2.5 g/dL (ref 1.5–4.5)
Glucose: 94 mg/dL (ref 65–99)
Potassium: 4.5 mmol/L (ref 3.5–5.2)
Sodium: 139 mmol/L (ref 134–144)
Total Protein: 7.2 g/dL (ref 6.0–8.5)
eGFR: 74 mL/min/{1.73_m2} (ref >59)

## 2020-05-01 ENCOUNTER — Encounter: Payer: Self-pay | Admitting: Family Medicine

## 2020-07-24 ENCOUNTER — Ambulatory Visit (INDEPENDENT_AMBULATORY_CARE_PROVIDER_SITE_OTHER): Payer: Medicare Other | Admitting: Family Medicine

## 2020-07-24 ENCOUNTER — Ambulatory Visit
Admission: RE | Admit: 2020-07-24 | Discharge: 2020-07-24 | Disposition: A | Discharge status: Home / Self Care | Payer: Medicare Other | Source: Ambulatory Visit | Attending: Family Medicine | Admitting: Family Medicine

## 2020-07-24 ENCOUNTER — Other Ambulatory Visit: Payer: Self-pay

## 2020-07-24 VITALS — BP 110/74 | HR 60 | Temp 97.4°F | Wt 252.2 lb

## 2020-07-24 DIAGNOSIS — I2510 Atherosclerotic heart disease of native coronary artery without angina pectoris: Secondary | ICD-10-CM | POA: Diagnosis not present

## 2020-07-24 DIAGNOSIS — M79672 Pain in left foot: Secondary | ICD-10-CM | POA: Diagnosis not present

## 2020-07-24 NOTE — Progress Notes (Signed)
   Subjective:    Patient ID: Brandon Frederick, male    DOB: May 23, 1955, 65 y.o.   MRN: [id number]  HPI He states that he inverted his left foot approximately 1 month ago and had some slight discomfort however the pain has not diminished.  He points to the mid fifth metatarsal area.   Review of Systems     Objective:   Physical Exam Left foot exam shows no tenderness over the lateral malleolus or ATF.  The head of the fifth metatarsal is nontender however midshaft palpation did cause pain.  Full motion of the ankle.       Assessment & Plan:  Left foot pain - Plan: DG Foot Complete Left

## 2020-07-25 ENCOUNTER — Encounter: Payer: Self-pay | Admitting: Family Medicine

## 2020-07-25 DIAGNOSIS — M79672 Pain in left foot: Secondary | ICD-10-CM

## 2020-07-29 ENCOUNTER — Other Ambulatory Visit: Payer: Self-pay | Admitting: Family Medicine

## 2020-07-29 DIAGNOSIS — I2510 Atherosclerotic heart disease of native coronary artery without angina pectoris: Secondary | ICD-10-CM

## 2020-08-02 ENCOUNTER — Ambulatory Visit: Payer: Medicare Other

## 2020-08-02 ENCOUNTER — Other Ambulatory Visit: Payer: Self-pay

## 2020-08-02 ENCOUNTER — Ambulatory Visit (INDEPENDENT_AMBULATORY_CARE_PROVIDER_SITE_OTHER): Payer: Medicare Other | Admitting: Podiatry

## 2020-08-02 DIAGNOSIS — M84375A Stress fracture, left foot, initial encounter for fracture: Secondary | ICD-10-CM

## 2020-08-02 DIAGNOSIS — I2510 Atherosclerotic heart disease of native coronary artery without angina pectoris: Secondary | ICD-10-CM | POA: Diagnosis not present

## 2020-08-02 NOTE — Progress Notes (Signed)
He presents today stating he has pain to his left ankle and foot.  States that it feels a lot like a sprain states that about a month ago he rolled his ankle as he was walking and the next day the pain began.  He saw his primary doctor who had a x-ray done and it was read as negative.  He denies any change in his past medical history medications or allergies.  Objective: Vital signs are stable he is alert oriented x3.  He has edema and mild erythema overlying the mid diaphyseal region of the left fifth metatarsal he has minimal ankle pain no anterior drawer.  He does have direct tenderness on palpation of the fifth metatarsal and vibratory sensation is exquisitely painful.  I reviewed the radiograph already taken and it does demonstrate what appears to be a very fine cortical fracture right just near of the site for a Jones fracture.  It is nondisplaced not comminuted.  Assessment probable stress fracture fifth met left.  Plan: He has a cam boot at home which she will start wearing immediately I expressed to him to start wearing it for about another month I like to follow-up with him at that time for another set of x-rays.

## 2020-08-13 ENCOUNTER — Other Ambulatory Visit: Payer: Self-pay | Admitting: Family Medicine

## 2020-08-13 DIAGNOSIS — N400 Enlarged prostate without lower urinary tract symptoms: Secondary | ICD-10-CM

## 2020-08-13 DIAGNOSIS — I10 Essential (primary) hypertension: Secondary | ICD-10-CM

## 2020-08-13 DIAGNOSIS — I2510 Atherosclerotic heart disease of native coronary artery without angina pectoris: Secondary | ICD-10-CM

## 2020-09-13 ENCOUNTER — Other Ambulatory Visit: Payer: Self-pay

## 2020-09-13 MED ORDER — NITROGLYCERIN 0.4 MG SL SUBL: 0.4000 mg | SUBLINGUAL_TABLET | SUBLINGUAL | 3 refills | Status: DC | PRN

## 2020-09-13 NOTE — Telephone Encounter (Signed)
A refill for Nitroglycerin has been sent today to [facility]

## 2020-10-30 ENCOUNTER — Other Ambulatory Visit: Payer: Self-pay | Admitting: Cardiovascular Disease

## 2020-10-30 DIAGNOSIS — E785 Hyperlipidemia, unspecified: Secondary | ICD-10-CM

## 2020-11-27 ENCOUNTER — Other Ambulatory Visit: Payer: Self-pay | Admitting: Family Medicine

## 2020-11-27 DIAGNOSIS — N400 Enlarged prostate without lower urinary tract symptoms: Secondary | ICD-10-CM

## 2020-11-27 DIAGNOSIS — I10 Essential (primary) hypertension: Secondary | ICD-10-CM

## 2020-11-27 DIAGNOSIS — I2510 Atherosclerotic heart disease of native coronary artery without angina pectoris: Secondary | ICD-10-CM

## 2020-12-12 ENCOUNTER — Other Ambulatory Visit: Payer: Self-pay | Admitting: Family Medicine

## 2020-12-12 DIAGNOSIS — N529 Male erectile dysfunction, unspecified: Secondary | ICD-10-CM

## 2020-12-12 NOTE — Telephone Encounter (Signed)
[facility] is requesting to fill pt tadalafil. Please advise [facility]

## 2021-01-20 ENCOUNTER — Other Ambulatory Visit: Payer: Self-pay | Admitting: Family Medicine

## 2021-01-20 DIAGNOSIS — I2510 Atherosclerotic heart disease of native coronary artery without angina pectoris: Secondary | ICD-10-CM

## 2021-01-20 DIAGNOSIS — I10 Essential (primary) hypertension: Secondary | ICD-10-CM

## 2021-02-09 ENCOUNTER — Other Ambulatory Visit: Payer: Self-pay | Admitting: Family Medicine

## 2021-02-09 DIAGNOSIS — I10 Essential (primary) hypertension: Secondary | ICD-10-CM

## 2021-02-09 DIAGNOSIS — I2510 Atherosclerotic heart disease of native coronary artery without angina pectoris: Secondary | ICD-10-CM

## 2021-02-09 DIAGNOSIS — N400 Enlarged prostate without lower urinary tract symptoms: Secondary | ICD-10-CM

## 2021-04-14 ENCOUNTER — Other Ambulatory Visit: Payer: Self-pay | Admitting: Family Medicine

## 2021-04-14 DIAGNOSIS — I10 Essential (primary) hypertension: Secondary | ICD-10-CM

## 2021-04-14 DIAGNOSIS — I2510 Atherosclerotic heart disease of native coronary artery without angina pectoris: Secondary | ICD-10-CM

## 2021-05-03 ENCOUNTER — Ambulatory Visit (INDEPENDENT_AMBULATORY_CARE_PROVIDER_SITE_OTHER): Payer: Medicare Other

## 2021-05-03 VITALS — Ht 74.0 in | Wt 243.0 lb

## 2021-05-03 DIAGNOSIS — Z0000 Encounter for general adult medical examination without abnormal findings: Secondary | ICD-10-CM | POA: Diagnosis not present

## 2021-05-03 NOTE — Progress Notes (Signed)
?I connected with Brandon Frederick today by telephone and verified that I am speaking with the correct person using two identifiers. ?Location patient: home ?Location provider: work ?Persons participating in the virtual visit: Brandon Frederick, [name]. ?  ?I discussed the limitations, risks, security and privacy concerns of performing an evaluation and management service by telephone and the availability of in person appointments. I also discussed with the patient that there may be a patient responsible charge related to this service. The patient expressed understanding and verbally consented to this telephonic visit.  ?  ?Interactive audio and video telecommunications were attempted between this provider and patient, however failed, due to patient having technical difficulties OR patient did not have access to video capability.  We continued and completed visit with audio only. ? ?  ? ?Vital signs may be patient reported or missing. ? ?Subjective:  ? Brandon Frederick is a 66 y.o. male who presents for Medicare Annual/Subsequent preventive examination. ? ?Review of Systems    ? ?Cardiac Risk Factors include: advanced age (>60mn, >>23women);male gender;obesity (BMI >30kg/m2) ? ?   ?Objective:  ?  ?Today's Vitals  ? 05/03/21 1356 05/03/21 1357  ?Weight: 243 lb (110.2 kg)   ?Height: '6\' 2"'$  (1.88 m)   ?PainSc:  6   ? ?Body mass index is 31.2 kg/m?. ? ? ?  05/03/2021  ?  2:02 PM 04/27/2020  ? 10:55 AM 02/16/2019  ?  9:23 AM 12/01/2017  ?  9:39 AM  ?Advanced Directives  ?Does Patient Have a Medical Advance Directive? Yes Yes Yes Yes  ?Type of [place] will Living will  [place]  ?Does patient want to make changes to medical advance directive?   No - Patient declined No - Patient declined  ?Copy of [place]? No - copy requested   No - copy requested  ? ? ?Current Medications (verified) ?Outpatient Encounter Medications  as of 05/03/2021  ?Medication Sig  ? aspirin 81 MG tablet Take 1 tablet (81 mg total) by mouth daily.  ? atorvastatin (LIPITOR) 80 MG tablet TAKE 1 TABLET DAILY AT 6PM  ? clopidogrel (PLAVIX) 75 MG tablet TAKE 1 TABLET DAILY  ? finasteride (PROSCAR) 5 MG tablet TAKE 1 TABLET DAILY  ? metoprolol succinate (TOPROL-XL) 25 MG 24 hr tablet TAKE 1 TABLET DAILY  ? nitroGLYCERIN (NITROSTAT) 0.4 MG SL tablet Place 1 tablet (0.4 mg total) under the tongue every 5 (five) minutes as needed for chest pain.  ? ramipril (ALTACE) 2.5 MG capsule TAKE 1 CAPSULE TWICE DAILY  ? tadalafil (CIALIS) 20 MG tablet TAKE 1 TABLET BY MOUTH ONCE DAILY AS NEEDED FOR ERECTILE DYSFUNCTION  ? ?No facility-administered encounter medications on file as of 05/03/2021.  ? ? ?Allergies (verified) ?Dilaudid [hydromorphone] and Hydrocodone-acetaminophen  ? ?History: ?Past Medical History:  ?Diagnosis Date  ? ASHD (arteriosclerotic heart disease)   ? stent  ? Dyslipidemia   ? History of stress test 09/2011  ? Which showed his previously documented scarring anteriorly and apically, concordant with his LAD infarction. Post-stress EF 40%  ? Hx of echocardiogram   ? showed an EF of approximately 40% with moderate-to-severe anterior wall hypokenisis and moderate-to-severe apical wall hypokinesis. He did have mild aortic sclerosis. There was mild pulmonary hypertensin with estimated RV systolic pressure of 34 mm.  ? Hyperlipidemia   ? [place]   ? torso  ? [place]ocardial infarction) ([place], [place]  ?  Large Anterior wall, at which he underwent stenting of his promixal occluded LAD with ultimate insertion of a 3.5 x 28 mm Cypher stent post dilated to 4.1 cm.  ? ?Past Surgical History:  ?Procedure Laterality Date  ? CARDIAC CATHETERIZATION  06/2007  ?  revealed a progressive disease in the mid LAD, and he underwent insertion of a new mid-LAD stents as well stenting of his PLA branch of his right coronary artery with a 2.75 x 30 mm Cypher stent post dilated  3.25 mm. He did also have some mild additional concomitant CAD.  ? COLONOSCOPY    ? CORONARY ANGIOPLASTY WITH STENT PLACEMENT    ? CORONARY STENT INTERVENTION N/A 11/30/2017  ? Procedure: CORONARY STENT INTERVENTION;  Surgeon: Jettie Booze, MD;  Location: [facility];  Service: Cardiovascular;  Laterality: N/A;  ? LEFT HEART CATH AND CORONARY ANGIOGRAPHY N/A 11/30/2017  ? Procedure: LEFT HEART CATH AND CORONARY ANGIOGRAPHY;  Surgeon: Jettie Booze, MD;  Location: [facility];  Service: Cardiovascular;  Laterality: N/A;  ? ?Family History  ?Problem Relation Age of Onset  ? Heart disease Father   ?     5 way bypass  ? Colon cancer Maternal Grandfather   ?     dx in his 73's  ? Crohn's disease Brother   ? ?Social History  ? ?Socioeconomic History  ? Marital status: Married  ?  Spouse name: Not on file  ? Number of children: 0  ? Years of education: Not on file  ? Highest education level: Not on file  ?Occupational History  ? Occupation: retired [employer]  ?Tobacco Use  ? Smoking status: Never  ? Smokeless tobacco: Never  ?Vaping Use  ? Vaping Use: Never used  ?Substance and Sexual Activity  ? Alcohol use: Yes  ?  Alcohol/week: 0.0 standard drinks  ?  Comment: occ.  ? Drug use: No  ? Sexual activity: Yes  ?Other Topics Concern  ? Not on file  ?Social History Narrative  ? Not on file  ? ?Social Determinants of Health  ? ?Financial Resource Strain: Low Risk   ? Difficulty of Paying Living Expenses: Not hard at all  ?Food Insecurity: No Food Insecurity  ? Worried About Charity fundraiser in the Last Year: Never true  ? Ran Out of Food in the Last Year: Never true  ?Transportation Needs: No Transportation Needs  ? Lack of Transportation (Medical): No  ? Lack of Transportation (Non-Medical): No  ?Physical Activity: Inactive  ? Days of Exercise per Week: 0 days  ? Minutes of Exercise per Session: 0 min  ?Stress: No Stress Concern Present  ? Feeling of Stress : Not at all  ?Social Connections: Not  on file  ? ? ?Tobacco Counseling ?Counseling given: Not Answered ? ? ?Clinical Intake: ? ?Pre-visit preparation completed: Yes ? ?Pain : 0-10 ?Pain Score: 6  ?Pain Type: Acute pain ?Pain Location: Back ?Pain Orientation: Lower ?Pain Descriptors / Indicators: Shooting ?Pain Onset: 1 to 4 weeks ago ?Pain Frequency: Constant ? ?  ? ?Nutritional Status: BMI > 30  Obese ?Nutritional Risks: None ?Diabetes: No ? ?How often do you need to have someone help you when you read instructions, pamphlets, or other written materials from your doctor or pharmacy?: 1 - Never ?What is the last grade level you completed in school?: college ? ?Diabetic? no ? ?Interpreter Needed?: No ? ?Information entered by :: NAllen LPN ? ? ?Activities of Daily Living ? ?  05/03/2021  ?  2:03 PM 05/01/2021  ?  3:41 PM  ?In your present state of health, do you have any difficulty performing the following activities:  ?Hearing? 0 0  ?Vision? 0 0  ?Difficulty concentrating or making decisions? 0 0  ?Walking or climbing stairs? 0 0  ?Dressing or bathing? 0 0  ?Doing errands, shopping? 0 0  ?Preparing Food and eating ? N N  ?Using the Toilet? N N  ?In the past six months, have you accidently leaked urine? N N  ?Do you have problems with loss of bowel control? N N  ?Managing your Medications? N N  ?Managing your Finances? N N  ?Housekeeping or managing your Housekeeping? N N  ? ? ?Patient Care Team: ?Denita Lung, MD as PCP - General (Family Medicine) ?Troy Sine, MD as PCP - Cardiology (Cardiology) ? ?Indicate any recent Medical Services you may have received from other than [facility] providers in the past year (date may be approximate). ? ?   ?Assessment:  ? This is a routine wellness examination for Micharl. ? ?Hearing/Vision screen ?Vision Screening - Comments:: Regular eye exams ? ?Dietary issues and exercise activities discussed: ?Current Exercise Habits: The patient has a physically strenuous job, but has no regular exercise apart from work. ? ?  Goals Addressed   ? ?  ?  ?  ?  ? This Visit's Progress  ?  Patient Stated     ?  05/03/2021, no goal ?  ? ?  ? ?Depression Screen ? ?  05/03/2021  ?  2:03 PM 04/27/2020  ? 10:55 AM 02/16/2019  ?  8:59 AM 04/09/18

## 2021-05-03 NOTE — Patient Instructions (Signed)
Mr. Cahalan , ?Thank you for taking time to come for your Medicare Wellness Visit. I appreciate your ongoing commitment to your health goals. Please review the following plan we discussed and let me know if I can assist you in the future.  ? ?Screening recommendations/referrals: ?Colonoscopy: completed 08/08/2015, due 08/07/2025 ?Recommended yearly ophthalmology/optometry visit for glaucoma screening and checkup ?Recommended yearly dental visit for hygiene and checkup ? ?Vaccinations: ?Influenza vaccine: due 08/06/2021 ?Pneumococcal vaccine: completed 04/27/2020 ?Tdap vaccine: completed 04/08/2016, due 04/09/2026 ?Shingles vaccine: completed   ?Covid-19:  04/06/2019, 03/10/2019 ? ?Advanced directives: Please bring a copy of your POA (Power of Attorney) and/or Living Will to your next appointment.  ? ?Conditions/risks identified: none ? ?Next appointment: Follow up in one year for your annual wellness visit.  ? ?Preventive Care 68 Years and Older, Male ?Preventive care refers to lifestyle choices and visits with your health care provider that can promote health and wellness. ?What does preventive care include? ?A yearly physical exam. This is also called an annual well check. ?Dental exams once or twice a year. ?Routine eye exams. Ask your health care provider how often you should have your eyes checked. ?Personal lifestyle choices, including: ?Daily care of your teeth and gums. ?Regular physical activity. ?Eating a healthy diet. ?Avoiding tobacco and drug use. ?Limiting alcohol use. ?Practicing safe sex. ?Taking low doses of aspirin every day. ?Taking vitamin and mineral supplements as recommended by your health care provider. ?What happens during an annual well check? ?The services and screenings done by your health care provider during your annual well check will depend on your age, overall health, lifestyle risk factors, and family history of disease. ?Counseling  ?Your health care provider may ask you questions about  your: ?Alcohol use. ?Tobacco use. ?Drug use. ?Emotional well-being. ?Home and relationship well-being. ?Sexual activity. ?Eating habits. ?History of falls. ?Memory and ability to understand (cognition). ?Work and work Statistician. ?Screening  ?You may have the following tests or measurements: ?Height, weight, and BMI. ?Blood pressure. ?Lipid and cholesterol levels. These may be checked every 5 years, or more frequently if you are over 79 years old. ?Skin check. ?Lung cancer screening. You may have this screening every year starting at age 40 if you have a 30-pack-year history of smoking and currently smoke or have quit within the past 15 years. ?Fecal occult blood test (FOBT) of the stool. You may have this test every year starting at age 37. ?Flexible sigmoidoscopy or colonoscopy. You may have a sigmoidoscopy every 5 years or a colonoscopy every 10 years starting at age 72. ?Prostate cancer screening. Recommendations will vary depending on your family history and other risks. ?Hepatitis C blood test. ?Hepatitis B blood test. ?Sexually transmitted disease (STD) testing. ?Diabetes screening. This is done by checking your blood sugar (glucose) after you have not eaten for a while (fasting). You may have this done every 1-3 years. ?Abdominal aortic aneurysm (AAA) screening. You may need this if you are a current or former smoker. ?Osteoporosis. You may be screened starting at age 30 if you are at high risk. ?Talk with your health care provider about your test results, treatment options, and if necessary, the need for more tests. ?Vaccines  ?Your health care provider may recommend certain vaccines, such as: ?Influenza vaccine. This is recommended every year. ?Tetanus, diphtheria, and acellular pertussis (Tdap, Td) vaccine. You may need a Td booster every 10 years. ?Zoster vaccine. You may need this after age 8. ?Pneumococcal 13-valent conjugate (PCV13) vaccine. One dose is  recommended after age 38. ?Pneumococcal  polysaccharide (PPSV23) vaccine. One dose is recommended after age 62. ?Talk to your health care provider about which screenings and vaccines you need and how often you need them. ?This information is not intended to replace advice given to you by your health care provider. Make sure you discuss any questions you have with your health care provider. ?Document Released: 01/19/2015 Document Revised: 09/12/2015 Document Reviewed: 10/24/2014 ?Elsevier Interactive Patient Education ? 2017 [employer]. ? ?Fall Prevention in the Home ?Falls can cause injuries. They can happen to people of all ages. There are many things you can do to make your home safe and to help prevent falls. ?What can I do on the outside of my home? ?Regularly fix the edges of walkways and driveways and fix any cracks. ?Remove anything that might make you trip as you walk through a door, such as a raised step or threshold. ?Trim any bushes or trees on the path to your home. ?Use bright outdoor lighting. ?Clear any walking paths of anything that might make someone trip, such as rocks or tools. ?Regularly check to see if handrails are loose or broken. Make sure that both sides of any steps have handrails. ?Any raised decks and porches should have guardrails on the edges. ?Have any leaves, snow, or ice cleared regularly. ?Use sand or salt on walking paths during winter. ?Clean up any spills in your garage right away. This includes oil or grease spills. ?What can I do in the bathroom? ?Use night lights. ?Install grab bars by the toilet and in the tub and shower. Do not use towel bars as grab bars. ?Use non-skid mats or decals in the tub or shower. ?If you need to sit down in the shower, use a plastic, non-slip stool. ?Keep the floor dry. Clean up any water that spills on the floor as soon as it happens. ?Remove soap buildup in the tub or shower regularly. ?Attach bath mats securely with double-sided non-slip rug tape. ?Do not have throw rugs and other  things on the floor that can make you trip. ?What can I do in the bedroom? ?Use night lights. ?Make sure that you have a light by your bed that is easy to reach. ?Do not use any sheets or blankets that are too big for your bed. They should not hang down onto the floor. ?Have a firm chair that has side arms. You can use this for support while you get dressed. ?Do not have throw rugs and other things on the floor that can make you trip. ?What can I do in the kitchen? ?Clean up any spills right away. ?Avoid walking on wet floors. ?Keep items that you use a lot in easy-to-reach places. ?If you need to reach something above you, use a strong step stool that has a grab bar. ?Keep electrical cords out of the way. ?Do not use floor polish or wax that makes floors slippery. If you must use wax, use non-skid floor wax. ?Do not have throw rugs and other things on the floor that can make you trip. ?What can I do with my stairs? ?Do not leave any items on the stairs. ?Make sure that there are handrails on both sides of the stairs and use them. Fix handrails that are broken or loose. Make sure that handrails are as long as the stairways. ?Check any carpeting to make sure that it is firmly attached to the stairs. Fix any carpet that is loose or worn. ?Avoid having  throw rugs at the top or bottom of the stairs. If you do have throw rugs, attach them to the floor with carpet tape. ?Make sure that you have a light switch at the top of the stairs and the bottom of the stairs. If you do not have them, ask someone to add them for you. ?What else can I do to help prevent falls? ?Wear shoes that: ?Do not have high heels. ?Have rubber bottoms. ?Are comfortable and fit you well. ?Are closed at the toe. Do not wear sandals. ?If you use a stepladder: ?Make sure that it is fully opened. Do not climb a closed stepladder. ?Make sure that both sides of the stepladder are locked into place. ?Ask someone to hold it for you, if possible. ?Clearly  mark and make sure that you can see: ?Any grab bars or handrails. ?First and last steps. ?Where the edge of each step is. ?Use tools that help you move around (mobility aids) if they are needed. These include: ?Canes. ?W

## 2021-05-08 ENCOUNTER — Encounter: Payer: Self-pay | Admitting: Family Medicine

## 2021-05-08 ENCOUNTER — Ambulatory Visit (INDEPENDENT_AMBULATORY_CARE_PROVIDER_SITE_OTHER): Payer: Medicare Other | Admitting: Family Medicine

## 2021-05-08 VITALS — BP 134/88 | HR 57 | Temp 97.5°F | Ht 74.0 in | Wt 248.6 lb

## 2021-05-08 DIAGNOSIS — N529 Male erectile dysfunction, unspecified: Secondary | ICD-10-CM

## 2021-05-08 DIAGNOSIS — N400 Enlarged prostate without lower urinary tract symptoms: Secondary | ICD-10-CM | POA: Diagnosis not present

## 2021-05-08 DIAGNOSIS — I2510 Atherosclerotic heart disease of native coronary artery without angina pectoris: Secondary | ICD-10-CM | POA: Diagnosis not present

## 2021-05-08 DIAGNOSIS — I10 Essential (primary) hypertension: Secondary | ICD-10-CM

## 2021-05-08 DIAGNOSIS — E785 Hyperlipidemia, unspecified: Secondary | ICD-10-CM | POA: Diagnosis not present

## 2021-05-08 DIAGNOSIS — Z85820 Personal history of malignant melanoma of skin: Secondary | ICD-10-CM

## 2021-05-08 DIAGNOSIS — E669 Obesity, unspecified: Secondary | ICD-10-CM | POA: Diagnosis not present

## 2021-05-08 MED ORDER — METOPROLOL SUCCINATE ER 25 MG PO TB24: 25.0000 mg | ORAL_TABLET | Freq: Every day | ORAL | 3 refills | Status: DC

## 2021-05-08 MED ORDER — CLOPIDOGREL BISULFATE 75 MG PO TABS: 75.0000 mg | ORAL_TABLET | Freq: Every day | ORAL | 3 refills | Status: DC

## 2021-05-08 MED ORDER — RAMIPRIL 2.5 MG PO CAPS: 2.5000 mg | ORAL_CAPSULE | Freq: Two times a day (BID) | ORAL | 3 refills | Status: DC

## 2021-05-08 MED ORDER — FINASTERIDE 5 MG PO TABS: 5.0000 mg | ORAL_TABLET | Freq: Every day | ORAL | 3 refills | Status: DC

## 2021-05-08 MED ORDER — ATORVASTATIN CALCIUM 80 MG PO TABS: 80.0000 mg | ORAL_TABLET | Freq: Every day | ORAL | 3 refills | Status: DC

## 2021-05-08 NOTE — Patient Instructions (Signed)
Acute Back Pain, Adult Acute back pain is sudden and usually short-lived. It is often caused by an injury to the muscles and tissues in the back. The injury may result from: A muscle, tendon, or ligament getting overstretched or torn. Ligaments are tissues that connect bones to each other. Lifting something improperly can cause a back strain. Wear and tear (degeneration) of the spinal disks. Spinal disks are circular tissue that provide cushioning between the bones of the spine (vertebrae). Twisting motions, such as while playing sports or doing yard work. A hit to the back. Arthritis. You may have a physical exam, lab tests, and imaging tests to find the cause of your pain. Acute back pain usually goes away with rest and home care. Follow these instructions at home: Managing pain, stiffness, and swelling Take over-the-counter and prescription medicines only as told by your health care provider. Treatment may include medicines for pain and inflammation that are taken by mouth or applied to the skin, or muscle relaxants. Your health care provider may recommend applying ice during the first 24-48 hours after your pain starts. To do this: Put ice in a plastic bag. Place a towel between your skin and the bag. Leave the ice on for 20 minutes, 2-3 times a day. Remove the ice if your skin turns bright red. This is very important. If you cannot feel pain, heat, or cold, you have a greater risk of damage to the area. If directed, apply heat to the affected area as often as told by your health care provider. Use the heat source that your health care provider recommends, such as a moist heat pack or a heating pad. Place a towel between your skin and the heat source. Leave the heat on for 20-30 minutes. Remove the heat if your skin turns bright red. This is especially important if you are unable to feel pain, heat, or cold. You have a greater risk of getting burned. Activity  Do not stay in bed. Staying in  bed for more than 1-2 days can delay your recovery. Sit up and stand up straight. Avoid leaning forward when you sit or hunching over when you stand. If you work at a desk, sit close to it so you do not need to lean over. Keep your chin tucked in. Keep your neck drawn back, and keep your elbows bent at a 90-degree angle (right angle). Sit high and close to the steering wheel when you drive. Add lower back (lumbar) support to your car seat, if needed. Take short walks on even surfaces as soon as you are able. Try to increase the length of time you walk each day. Do not sit, drive, or stand in one place for more than 30 minutes at a time. Sitting or standing for long periods of time can put stress on your back. Do not drive or use heavy machinery while taking prescription pain medicine. Use proper lifting techniques. When you bend and lift, use positions that put less stress on your back: Bend your knees. Keep the load close to your body. Avoid twisting. Exercise regularly as told by your health care provider. Exercising helps your back heal faster and helps prevent back injuries by keeping muscles strong and flexible. Work with a physical therapist to make a safe exercise program, as recommended by your health care provider. Do any exercises as told by your physical therapist. Lifestyle Maintain a healthy weight. Extra weight puts stress on your back and makes it difficult to have good   posture. Avoid activities or situations that make you feel anxious or stressed. Stress and anxiety increase muscle tension and can make back pain worse. Learn ways to manage anxiety and stress, such as through exercise. General instructions Sleep on a firm mattress in a comfortable position. Try lying on your side with your knees slightly bent. If you lie on your back, put a pillow under your knees. Keep your head and neck in a straight line with your spine (neutral position) when using electronic equipment like  smartphones or pads. To do this: Raise your smartphone or pad to look at it instead of bending your head or neck to look down. Put the smartphone or pad at the level of your face while looking at the screen. Follow your treatment plan as told by your health care provider. This may include: Cognitive or behavioral therapy. Acupuncture or massage therapy. Meditation or yoga. Contact a health care provider if: You have pain that is not relieved with rest or medicine. You have increasing pain going down into your legs or buttocks. Your pain does not improve after 2 weeks. You have pain at night. You lose weight without trying. You have a fever or chills. You develop nausea or vomiting. You develop abdominal pain. Get help right away if: You develop new bowel or bladder control problems. You have unusual weakness or numbness in your arms or legs. You feel faint. These symptoms may represent a serious problem that is an emergency. Do not wait to see if the symptoms will go away. Get medical help right away. Call your local emergency services (911 in the [place] Do not drive yourself to the hospital. Summary Acute back pain is sudden and usually short-lived. Use proper lifting techniques. When you bend and lift, use positions that put less stress on your back. Take over-the-counter and prescription medicines only as told by your health care provider, and apply heat or ice as told. This information is not intended to replace advice given to you by your health care provider. Make sure you discuss any questions you have with your health care provider. Document Revised: 03/16/2020 Document Reviewed: 03/16/2020 Elsevier Patient Education  2023 [employer]  

## 2021-05-08 NOTE — Progress Notes (Signed)
? ?  Subjective:  ? ? Patient ID: Brandon Frederick, male    DOB: [birth year], 66 y.o.   MRN: [id number] ? ?HPI ?He is here for a med check.  He does have underlying ASHD but is presently having no difficulty with chest pain, shortness of breath, PND or DOE.  He has a long history of difficulty with low back pain and has been doing well work on a car which recently finished a hopefully he can take better care of his back.  He does take finasteride for his BPH and is having no difficulty with that.  He responds well to occasional use of Cialis for his ED.  Continues on atorvastatin and having no difficulty with that.  He is also taking Plavix.  He is retired and enjoying his retirement except for the fact that now his mother is living with him which does create some family stresses.  His immunizations were reviewed and at this point he does not want any more immunizations. ? ? ?Review of Systems ? ?   ?Objective:  ? Physical Exam ?Alert and in no distress. Tympanic membranes and canals are normal. Pharyngeal area is normal. Neck is supple without adenopathy or thyromegaly. Cardiac exam shows a regular sinus rhythm without murmurs or gallops. Lungs are clear to auscultation. ?Exam of the back shows no palpable tenderness.  Negative straight leg raising.  DTRs are normal.  Hip motion normal. ? ? ? ?   ?Assessment & Plan:  ?Hyperlipidemia with target LDL less than 70 - Plan: Lipid panel, atorvastatin (LIPITOR) 80 MG tablet ? ?Benign prostatic hyperplasia without lower urinary tract symptoms - Plan: finasteride (PROSCAR) 5 MG tablet ? ?Mild obesity ? ?Essential hypertension - Plan: CBC with Differential/Platelet, Comprehensive metabolic panel, ramipril (ALTACE) 2.5 MG capsule, metoprolol succinate (TOPROL-XL) 25 MG 24 hr tablet ? ?ASHD (arteriosclerotic heart disease) - Plan: CBC with Differential/Platelet, Comprehensive metabolic panel, Lipid panel, ramipril (ALTACE) 2.5 MG capsule, metoprolol succinate (TOPROL-XL) 25 MG 24  hr tablet, clopidogrel (PLAVIX) 75 MG tablet ? ?History of melanoma ? ?Erectile dysfunction, unspecified erectile dysfunction type ?He will continue on his present medication regimen and follow-up with cardiology.  Call when he needs a refill on Cialis.  We will set up an appointment to be seen by dermatology.  Back care instructions given.  He will call if further difficulty and possible referral to physical therapy for good back rehab program. ? ?

## 2021-05-09 LAB — COMPREHENSIVE METABOLIC PANEL
ALT: 33 IU/L (ref 0–44)
AST: 25 IU/L (ref 0–40)
Albumin/Globulin Ratio: 2 (ref 1.2–2.2)
Albumin: 4.9 g/dL — ABNORMAL HIGH (ref 3.8–4.8)
Alkaline Phosphatase: 73 IU/L (ref 44–121)
BUN/Creatinine Ratio: 17 (ref 10–24)
BUN: 18 mg/dL (ref 8–27)
Bilirubin Total: 1 mg/dL (ref 0.0–1.2)
CO2: 23 mmol/L (ref 20–29)
Calcium: 9.9 mg/dL (ref 8.6–10.2)
Chloride: 103 mmol/L (ref 96–106)
Creatinine, Ser: 1.04 mg/dL (ref 0.76–1.27)
Globulin, Total: 2.4 g/dL (ref 1.5–4.5)
Glucose: 102 mg/dL — ABNORMAL HIGH (ref 70–99)
Potassium: 5.1 mmol/L (ref 3.5–5.2)
Sodium: 139 mmol/L (ref 134–144)
Total Protein: 7.3 g/dL (ref 6.0–8.5)
eGFR: 79 mL/min/{1.73_m2} (ref >59)

## 2021-05-09 LAB — CBC WITH DIFFERENTIAL/PLATELET
Basophils Absolute: 0.1 10*3/uL (ref 0.0–0.2)
Basos: 1 %
EOS (ABSOLUTE): 0.3 10*3/uL (ref 0.0–0.4)
Eos: 5 %
Hematocrit: 48.8 % (ref 37.5–51.0)
Hemoglobin: 16.4 g/dL (ref 13.0–17.7)
Immature Grans (Abs): 0 10*3/uL (ref 0.0–0.1)
Immature Granulocytes: 0 %
Lymphocytes Absolute: 1.4 10*3/uL (ref 0.7–3.1)
Lymphs: 19 %
MCH: 32 pg (ref 26.6–33.0)
MCHC: 33.6 g/dL (ref 31.5–35.7)
MCV: 95 fL (ref 79–97)
Monocytes Absolute: 0.8 10*3/uL (ref 0.1–0.9)
Monocytes: 11 %
Neutrophils Absolute: 4.4 10*3/uL (ref 1.4–7.0)
Neutrophils: 64 %
Platelets: 169 10*3/uL (ref 150–450)
RBC: 5.12 x10E6/uL (ref 4.14–5.80)
RDW: 14.1 % (ref 11.6–15.4)
WBC: 7 10*3/uL (ref 3.4–10.8)

## 2021-05-09 LAB — LIPID PANEL
Chol/HDL Ratio: 2.5 ratio (ref 0.0–5.0)
Cholesterol, Total: 141 mg/dL (ref 100–199)
HDL: 57 mg/dL (ref >39)
LDL Chol Calc (NIH): 61 mg/dL (ref 0–99)
Triglycerides: 129 mg/dL (ref 0–149)
VLDL Cholesterol Cal: 23 mg/dL (ref 5–40)

## 2021-06-02 ENCOUNTER — Encounter: Payer: Self-pay | Admitting: Family Medicine

## 2021-06-07 ENCOUNTER — Ambulatory Visit (INDEPENDENT_AMBULATORY_CARE_PROVIDER_SITE_OTHER): Payer: Medicare Other | Admitting: Family Medicine

## 2021-06-07 VITALS — BP 106/70 | HR 67 | Temp 97.9°F | Wt 251.2 lb

## 2021-06-07 DIAGNOSIS — I2510 Atherosclerotic heart disease of native coronary artery without angina pectoris: Secondary | ICD-10-CM

## 2021-06-07 DIAGNOSIS — M461 Sacroiliitis, not elsewhere classified: Secondary | ICD-10-CM

## 2021-06-07 NOTE — Patient Instructions (Signed)
Either take 2 Aleve twice per day or 4 ibuprofen 3 times per day and you can also take 2 extra strength Tylenol 3 times per day..  Do the heat for 20 minutes 3 times per day and gentle stretching afterwards

## 2021-06-07 NOTE — Progress Notes (Signed)
   Subjective:    Patient ID: Brandon Frederick, male    DOB: 1955-11-24, 66 y.o.   MRN: [id number]  HPI He is here for evaluation of low back pain.  He has a previous history of difficulty with back pain that was usually associated with strain or overuse.  He states for the last month he has noted bilateral lower back issues, right greater than left.  He has been using Tylenol and heat and done some stretching but is only seen 20% improvement.  No numbness, tingling or weakness in his legs.   Review of Systems     Objective:   Physical Exam Some splinting noted when he got out of the chair.  Tender to palpation over the SI joints, right greater than left.  Normal hip motion.  Normal lumbar motion and no tenderness.  Negative straight leg raising.  FABER test is positive.       Assessment & Plan:  Sacroiliitis ([place]) Either take 2 Aleve twice per day or 4 ibuprofen 3 times per day and you can also take 2 extra strength Tylenol 3 times per day..  Do the heat for 20 minutes 3 times per day and gentle stretching afterwards.  If he continues have difficulty, further evaluation and treatment may be needed.  He was comfortable with that.

## 2021-07-09 NOTE — Progress Notes (Unsigned)
Cardiology Clinic Note   Patient Name: Brandon Frederick Date of Encounter: 07/10/2021  Primary Care Provider:  Denita Lung, MD Primary Cardiologist:  Brandon Majestic, MD  Patient Profile    66 year old male patient with known history of CAD with MI in June 2004 with total occlusion of the proximal LAD.  He is status post stenting of his proximal LAD with a 3.5 x 20 mm Cypher stent postdilated to 4.1 mm.,  2009 progressive disease in the mid LAD with additional 2.75 x 18 mm Cypher stent implantation postdilated to 3.35-3.25 taper; the patient has a 2.75 x 13 mm stent in the PLA branch of his RCA and is on life long dual antiplatelet therapy with aspirin and Plavix.    Most recent cardiac catheterization performed by Dr. Irish Frederick, November 2019 revealed 95% first diagonal stenosis and a branch of the diagonal was occluded with left to left collaterals; 75% ostial to proximal circumflex stenosis with successful Synergy drug-eluting stent 3.0 x 16 mm stent to circumflex vessel.  Echocardiogram during that time revealed an EF of 45 to 84%, grade 2 diastolic dysfunction, aortic root dimension 38 mm with moderate left atrial dilatation Other history includes hypercholesterolemia mild aortic valve stenosis,hypertension, melanoma, ED, BPH, and mild obesity. He has not been seen since 2021.  Past Medical History    Past Medical History:  Diagnosis Date   ASHD (arteriosclerotic heart disease)    stent   Dyslipidemia    History of stress test 09/2011   Which showed his previously documented scarring anteriorly and apically, concordant with his LAD infarction. Post-stress EF 40%   Hx of echocardiogram    showed an EF of approximately 40% with moderate-to-severe anterior wall hypokenisis and moderate-to-severe apical wall hypokinesis. He did have mild aortic sclerosis. There was mild pulmonary hypertensin with estimated RV systolic pressure of 34 mm.   Hyperlipidemia    Melanoma ([place])    torso   MI  (myocardial infarction) ([place]) 2004, 2009   Large Anterior wall, at which he underwent stenting of his promixal occluded LAD with ultimate insertion of a 3.5 x 28 mm Cypher stent post dilated to 4.1 cm.   Past Surgical History:  Procedure Laterality Date   CARDIAC CATHETERIZATION  06/2007    revealed a progressive disease in the mid LAD, and he underwent insertion of a new mid-LAD stents as well stenting of his PLA branch of his right coronary artery with a 2.75 x 30 mm Cypher stent post dilated 3.25 mm. He did also have some mild additional concomitant CAD.   COLONOSCOPY     CORONARY ANGIOPLASTY WITH STENT PLACEMENT     CORONARY STENT INTERVENTION N/A 11/30/2017   Procedure: CORONARY STENT INTERVENTION;  Surgeon: Brandon Booze, MD;  Location: [facility];  Service: Cardiovascular;  Laterality: N/A;   LEFT HEART CATH AND CORONARY ANGIOGRAPHY N/A 11/30/2017   Procedure: LEFT HEART CATH AND CORONARY ANGIOGRAPHY;  Surgeon: Brandon Booze, MD;  Location: [facility];  Service: Cardiovascular;  Laterality: N/A;    Allergies  Allergies  Allergen Reactions   Dilaudid [Hydromorphone]     Patient states doesn't work for him   Hydrocodone-Acetaminophen     Patient states doesn't work for him  **Can only take Percocet for pain**    History of Present Illness    Brandon Frederick presents today for annual visit for ongoing assessment and management of coronary artery disease with multiple stents as described above, hypertension, hyperlipidemia, and mild  obesity.  Last office visit with Brandon Frederick on 12/05/2019 he was doing well, remained active and like to work on antique cars.  He comes today without any cardiac complaints which would include chest pain, dyspnea on exertion, fatigue, dizziness, or significant changes in energy.  He is being plagued by sacroiliitis and has been followed by primary care for treatment.  Taking Tylenol and Aleve around-the-clock.  He is interested in  a electrical nerve stimulator which she has been researching which has been found to show improvement in these types of symptoms.  He is an Chief Financial Officer (retired) and continues to look for data surrounding the efficacy of this treatment modality.  He is medically compliant, labs are monitored through Brandon Frederick, primary care to include cholesterol status, kidney functioning, and evaluation concerning hematological status.  Home Medications    Current Outpatient Medications  Medication Sig Dispense Refill   Acetaminophen (TYLENOL ARTHRITIS PAIN PO) Take by mouth.     aspirin 81 MG tablet Take 1 tablet (81 mg total) by mouth daily. 30 tablet    atorvastatin (LIPITOR) 80 MG tablet Take 1 tablet (80 mg total) by mouth daily. 90 tablet 3   clopidogrel (PLAVIX) 75 MG tablet Take 1 tablet (75 mg total) by mouth daily. 90 tablet 3   finasteride (PROSCAR) 5 MG tablet Take 1 tablet (5 mg total) by mouth daily. 90 tablet 3   metoprolol succinate (TOPROL-XL) 25 MG 24 hr tablet Take 1 tablet (25 mg total) by mouth daily. 90 tablet 3   nitroGLYCERIN (NITROSTAT) 0.4 MG SL tablet Place 1 tablet (0.4 mg total) under the tongue every 5 (five) minutes as needed for chest pain. 30 tablet 3   ramipril (ALTACE) 2.5 MG capsule Take 1 capsule (2.5 mg total) by mouth 2 (two) times daily. 180 capsule 3   tadalafil (CIALIS) 20 MG tablet TAKE 1 TABLET BY MOUTH ONCE DAILY AS NEEDED FOR ERECTILE DYSFUNCTION 30 tablet 5   No current facility-administered medications for this visit.     Family History    Family History  Problem Relation Age of Onset   Heart disease Father        5 way bypass   Colon cancer Maternal Grandfather        dx in his 15's   Crohn's disease Brother    He indicated that his mother is alive. He indicated that his father is alive. He indicated that his sister is alive. He indicated that only one of his two brothers is alive. He indicated that the status of his maternal grandfather is  unknown.  Social History    Social History   Socioeconomic History   Marital status: Married    Spouse name: Not on file   Number of children: 0   Years of education: Not on file   Highest education level: Not on file  Occupational History   Occupation: retired [employer]  Tobacco Use   Smoking status: Never   Smokeless tobacco: Never  Vaping Use   Vaping Use: Never used  Substance and Sexual Activity   Alcohol use: Yes    Alcohol/week: 0.0 standard drinks of alcohol    Comment: occ.   Drug use: No   Sexual activity: Yes  Other Topics Concern   Not on file  Social History Narrative   Not on file   Social Determinants of Health   Financial Resource Strain: Low Risk  (05/03/2021)   Overall Financial Resource Strain (CARDIA)    Difficulty of  Paying Living Expenses: Not hard at all  Food Insecurity: No Food Insecurity (05/03/2021)   Hunger Vital Sign    Worried About Running Out of Food in the Last Year: Never true    Ran Out of Food in the Last Year: Never true  Transportation Needs: No Transportation Needs (05/03/2021)   PRAPARE - Hydrologist (Medical): No    [place] of Transportation (Non-Medical): No  Physical Activity: Inactive (05/03/2021)   Exercise Vital Sign    Days of Exercise per Week: 0 days    Minutes of Exercise per Session: 0 min  Stress: No Stress Concern Present (05/03/2021)   [place]    Feeling of Stress : Not at all  Social Connections: Not on file  Intimate Partner Violence: Not on file     Review of Systems    General:  No chills, fever, night sweats or weight changes.  Cardiovascular:  No chest pain, dyspnea on exertion, edema, orthopnea, palpitations, paroxysmal nocturnal dyspnea. Dermatological: No rash, lesions/masses Respiratory: No cough, dyspnea Urologic: No hematuria, dysuria Abdominal:   No nausea, vomiting, diarrhea, bright red blood per  rectum, melena, or hematemesis Neurologic:  No visual changes, wkns, changes in mental status.  Chronic sciatic pain All other systems reviewed and are otherwise negative except as noted above.     Physical Exam    VS:  BP 126/82   Pulse (!) 59   Ht '6\' 2"'$  (1.88 m)   Wt 251 lb 6.4 oz (114 kg)   SpO2 99%   BMI 32.28 kg/m  , BMI Body mass index is 32.28 kg/m.     GEN: Well nourished, well developed, in no acute distress. HEENT: normal. Neck: Supple, no JVD, carotid bruits, or masses. Cardiac: RRR, no murmurs, rubs, or gallops. No clubbing, cyanosis, edema.  Radials/DP/PT 2+ and equal bilaterally.  Respiratory:  Respirations regular and unlabored, clear to auscultation bilaterally. GI: Soft, nontender, nondistended, BS + x 4. MS: no deformity or atrophy. Skin: warm and dry, no rash. Neuro:  Strength and sensation are intact. Psych: Normal affect.  Accessory Clinical Findings    ECG personally reviewed by me today-sinus bradycardia with first-degree AV block heart rate of 59 bpm, (PR interval 260 ms) anterior lateral ischemia is noted- No acute changes  Lab Results  Component Value Date   WBC 7.0 05/08/2021   HGB 16.4 05/08/2021   HCT 48.8 05/08/2021   MCV 95 05/08/2021   PLT 169 05/08/2021   Lab Results  Component Value Date   CREATININE 1.04 05/08/2021   BUN 18 05/08/2021   NA 139 05/08/2021   K 5.1 05/08/2021   CL 103 05/08/2021   CO2 23 05/08/2021   Lab Results  Component Value Date   ALT 33 05/08/2021   AST 25 05/08/2021   ALKPHOS 73 05/08/2021   BILITOT 1.0 05/08/2021   Lab Results  Component Value Date   CHOL 141 05/08/2021   HDL 57 05/08/2021   LDLCALC 61 05/08/2021   TRIG 129 05/08/2021   CHOLHDL 2.5 05/08/2021    Lab Results  Component Value Date   HGBA1C 5.6 11/30/2017    Review of Prior Studies: Echocardiogram 11/30/2017  Left ventricle: The cavity size was mildly dilated. There was    moderate focal basal hypertrophy. Systolic function  was mildly    reduced. The estimated ejection fraction was in the range of 45%    to 50%. There is  akinesis of the apicalinferior and apical    myocardium. Features are consistent with a pseudonormal left    ventricular filling pattern, with concomitant abnormal relaxation    and increased filling pressure (grade 2 diastolic dysfunction).  - Aortic valve: Trileaflet; normal thickness, mildly calcified    leaflets.  - Aorta: Aortic root dimension: 38 mm (ED).  - Aortic root: The aortic root was mildly dilated.  - Left atrium: The atrium was mildly dilated.   Left Heart Cath with intervention [id number] There is mild left ventricular systolic dysfunction. LVEDP 11 mm Hg. The left ventricular ejection fraction is 45-50% by visual estimate. Anterolateral hypokinesis. There is no aortic valve stenosis. Prox LAD lesion is 25% stenosed. Patent LAD stents with mild distal restenosis. Mid LAD lesion is 40% stenosed. Ost 1st Diag lesion is 95% stenosed. Branch of diagonal occluded with left to left collaterals. Ost 1st Mrg lesion is 50% stenosed. Ost Cx to Prox Cx lesion is 75% stenosed. A drug-eluting stent was successfully placed using a STENT SYNERGY DES 3X16. Post intervention, there is a 0% residual stenosis.  Assessment & Plan   1.  Coronary artery disease: History of anterior wall myocardial infarction x3.  Most recent cardiac catheterization November 2019 revealed 95% first diagonal stenosis and branch of the diagonal was occluded with left to left collaterals, 75% ostial proximal circumflex stenosis with DES to vessel.  He remains on dual antiplatelet therapy with aspirin and Plavix without any complaints of bleeding.  He also denies any cardiac symptoms such as chest pressure, angina symptoms.  We will continue secondary management with blood pressure control, lipid management, and continued physical activity for maintenance of weight.  We will see him again in 1 year unless he is  symptomatic.  2.  Dyslipidemia: Remains on atorvastatin 80 mg daily.  Goal of LDL less than 70.  He states he has also had LP(a) drawn which was elevated for him.  Can consider referral to lipid clinic with Dr. Debara Pickett.  Most recent labs revealed an LDL of 61, with a total cholesterol of 141, HDL of 57 and triglycerides of 129.  LFTs were within normal limits.  3.  Chronic sacroiliitis: Being followed by PCP.  Is interested in electrical nerve stimulation and is researching a device.  He will be sending Brandon Frederick a email to ask his opinion on its use.  4.  Hypertension: Currently well controlled on ACE inhibitor.  Labs have been reviewed with normal kidney function    Current medicines are reviewed at length with the patient today.  I have spent 40 min's  dedicated to the care of this patient on the date of this encounter to include pre-visit review of records, assessment, management and review of past and current diagnostic testing,with shared decision making.  Signed, Phill Myron. West Pugh, ANP, AACC   07/10/2021 10:05 AM    [facility] [place] [address] Office [phone] Fax [phone]  Notice: This dictation was prepared with Dragon dictation along with smaller phrase technology. Any transcriptional errors that result from this process are unintentional and may not be corrected upon review.

## 2021-07-10 ENCOUNTER — Encounter: Payer: Self-pay | Admitting: Adult Health

## 2021-07-10 ENCOUNTER — Ambulatory Visit (INDEPENDENT_AMBULATORY_CARE_PROVIDER_SITE_OTHER): Payer: Medicare Other | Admitting: Adult Health

## 2021-07-10 ENCOUNTER — Encounter: Payer: Self-pay | Admitting: Family Medicine

## 2021-07-10 ENCOUNTER — Encounter: Payer: Self-pay | Admitting: Cardiovascular Disease

## 2021-07-10 VITALS — BP 126/82 | HR 59 | Ht 74.0 in | Wt 251.4 lb

## 2021-07-10 DIAGNOSIS — E669 Obesity, unspecified: Secondary | ICD-10-CM | POA: Diagnosis not present

## 2021-07-10 DIAGNOSIS — E785 Hyperlipidemia, unspecified: Secondary | ICD-10-CM

## 2021-07-10 DIAGNOSIS — I2510 Atherosclerotic heart disease of native coronary artery without angina pectoris: Secondary | ICD-10-CM | POA: Diagnosis not present

## 2021-07-10 DIAGNOSIS — I10 Essential (primary) hypertension: Secondary | ICD-10-CM | POA: Diagnosis not present

## 2021-07-10 DIAGNOSIS — M545 Low back pain, unspecified: Secondary | ICD-10-CM

## 2021-07-10 DIAGNOSIS — M5431 Sciatica, right side: Secondary | ICD-10-CM

## 2021-07-10 NOTE — Patient Instructions (Signed)
Medication Instructions:  Your physician recommends that you continue on your current medications as directed. Please refer to the Current Medication list given to you today.   *If you need a refill on your cardiac medications before your next appointment, please call your pharmacy*   Lab Work: NONE ordered at this time of appointment   If you have labs (blood work) drawn today and your tests are completely normal, you will receive your results only by: MyChart Message (if you have MyChart) OR A paper copy in the mail If you have any lab test that is abnormal or we need to change your treatment, we will call you to review the results.   Testing/Procedures: NONE ordered at this time of appointment     Follow-Up: At [facility], you and your health needs are our priority.  As part of our continuing mission to provide you with exceptional heart care, we have created designated Provider Care Teams.  These Care Teams include your primary Cardiologist (physician) and Advanced Practice Providers (APPs -  Physician Assistants and Nurse Practitioners) who all work together to provide you with the care you need, when you need it.  We recommend signing up for the patient portal called "MyChart".  Sign up information is provided on this After Visit Summary.  MyChart is used to connect with patients for Virtual Visits (Telemedicine).  Patients are able to view lab/test results, encounter notes, upcoming appointments, etc.  Non-urgent messages can be sent to your provider as well.   To learn more about what you can do with MyChart, go to [email or web address].    Your next appointment:   1 year(s)  The format for your next appointment:   In Person  Provider:   Thomas Kelly, MD     Other Instructions   Important Information About Sugar       

## 2021-07-15 ENCOUNTER — Encounter: Payer: Self-pay | Admitting: Physical Medicine and Rehabilitation

## 2021-07-15 ENCOUNTER — Ambulatory Visit (INDEPENDENT_AMBULATORY_CARE_PROVIDER_SITE_OTHER): Payer: Medicare Other | Admitting: Physical Medicine and Rehabilitation

## 2021-07-15 ENCOUNTER — Ambulatory Visit (INDEPENDENT_AMBULATORY_CARE_PROVIDER_SITE_OTHER): Payer: Medicare Other

## 2021-07-15 VITALS — BP 144/89 | HR 57

## 2021-07-15 DIAGNOSIS — M5416 Radiculopathy, lumbar region: Secondary | ICD-10-CM

## 2021-07-15 DIAGNOSIS — M4726 Other spondylosis with radiculopathy, lumbar region: Secondary | ICD-10-CM

## 2021-07-15 DIAGNOSIS — M47816 Spondylosis without myelopathy or radiculopathy, lumbar region: Secondary | ICD-10-CM

## 2021-07-15 DIAGNOSIS — G8929 Other chronic pain: Secondary | ICD-10-CM

## 2021-07-15 DIAGNOSIS — I2510 Atherosclerotic heart disease of native coronary artery without angina pectoris: Secondary | ICD-10-CM

## 2021-07-15 DIAGNOSIS — M5442 Lumbago with sciatica, left side: Secondary | ICD-10-CM | POA: Diagnosis not present

## 2021-07-15 NOTE — Progress Notes (Unsigned)
Pt state lower back pain that travels down his left leg. Pt state walking, standing and laying down makes the pain worse. Pt state he takes over the pain meds and uses heat to help ease his pain.  Numeric Pain Rating Scale and Functional Assessment Average Pain 8 Pain Right Now 1 My pain is constant, sharp, dull, stabbing, and aching Pain is worse with: walking, bending, sitting, standing, some activites, and laying down Pain improves with: rest, heat/ice, and medication   In the last MONTH (on 0-10 scale) has pain interfered with the following?  1. General activity like being  able to carry out your everyday physical activities such as walking, climbing stairs, carrying groceries, or moving a chair?  Rating(5)  2. Relation with others like being able to carry out your usual social activities and roles such as  activities at home, at work and in your community. Rating(6)  3. Enjoyment of life such that you have  been bothered by emotional problems such as feeling anxious, depressed or irritable?  Rating(7)

## 2021-07-15 NOTE — Progress Notes (Unsigned)
Brandon Frederick - 66 y.o. male MRN [id number]  Date of birth: 13-Dec-1955  Office Visit Note: Visit Date: 07/15/2021 PCP: Denita Lung, MD Referred by: Denita Lung, MD  Subjective: Chief Complaint  Patient presents with   Lower Back - Pain   Left Leg - Pain   HPI: Brandon Frederick is a 66 y.o. male who comes in today per the request of Dr. Jill Alexanders for evaluation of chronic, worsening and severe bilateral lower back pain pain, left greater than right with radiation of pain down posterior left leg to foot. Patient reports pain has been ongoing for several years and is exacerbated by movement and activity. He describes pain as a sore, aching and shooting sensation, currently rates as 7 out of 10. Patient reports some relief of pain with home exercise regimen, heating pad, rest and use of over the counter medications. Patient states he is currently taking Aleve at home.  Patient states his pain has gradually increased over the last several months and is now negatively impacting his daily life. Patient reports history of multiple back strains throughout the years, however his current pain feels significantly different than previously. No previous imaging of lumbar spine. No history of lumbar surgery. 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 Per Dr. Lanice Shirts notes he was concerned this could be an issue with sacroiliac joints. Patient has no history of lumbar injections. Patient denies focal weakness, numbness and tingling. Patient denies recent trauma or falls.    Review of Systems  Musculoskeletal:  Positive for back pain.  Neurological:  Negative for tingling, sensory change, focal weakness and weakness.  All other systems reviewed and are negative.  Otherwise per HPI.  Assessment & Plan: Visit Diagnoses:    ICD-10-CM   1. Lumbar radiculopathy  M54.16 XR Lumbar Spine  2-3 Views    Ambulatory referral to Physical Medicine Rehab    2. Chronic bilateral low back pain with left-sided sciatica  M54.42    G89.29     3. Other spondylosis with radiculopathy, lumbar region  M47.26     4. Facet arthropathy, lumbar  M47.816        Plan: Findings:  Chronic, worsening and severe bilateral lower back pain, left greater than right with radiation of pain down left posterior leg to foot. Patient continues to have severe pain despite good conservative therapies such as home exercise regimen, heating pad, rest and use of OTC medications. I did obtain 2 view lumbar imaging today which exhibits mutlilevel facet arthropathy, most severe bilaterally at L4-L5 and L5-S1, there is also degenerative disc height loss at the level of L5-S1. No listhesis noted. Patients clinical presentation and exam are consistent with S1 nerve pattern. We believe the next step is to perform a diagnostic and hopefully therapeutic left L5-S1 interlaminar epidural steroid injection under fluoroscopic guidance. I did discuss lumbar injection procedure with patient today in detail and provided him with educational information to take home and review. Patient is currently taking Plavix, we will contact Dr. Redmond School to take him off prior to injection procedure. If patient gets good relief of left sided radicular symptoms and lower back pain remains we would also consider performing diagnostic facet joint injections. We did discuss obtaining lumbar MRI imaging in the future if warranted. No red flag symptoms noted upon exam today.  I did  Meds & Orders: No orders of the defined types were placed in this encounter.   Orders Placed This Encounter  Procedures   XR Lumbar Spine 2-3 Views   Ambulatory referral to Physical Medicine Rehab    Follow-up: Return for Left L5-S1 interlaminar epidural steroid injection.   Procedures: No procedures performed      Clinical History: No specialty comments available.    He reports that he has never smoked. He has never used smokeless tobacco. No results for input(s): "HGBA1C", "LABURIC" in the last 8760 hours.  Objective:  VS:  HT:    WT:   BMI:     BP:(!) 144/89  HR:(!) 57bpm  TEMP: ( )  RESP:  Physical Exam Vitals and nursing note reviewed.  HENT:     Head: Normocephalic and atraumatic.     Right Ear: External ear normal.     Left Ear: External ear normal.     Nose: Nose normal.     Mouth/Throat:     Mouth: Mucous membranes are moist.  Eyes:     Extraocular Movements: Extraocular movements intact.  Cardiovascular:     Rate and Rhythm: Normal rate.     Pulses: Normal pulses.  Pulmonary:     Effort: Pulmonary effort is normal.  Abdominal:     General: Abdomen is flat. There is no distension.  Musculoskeletal:        General: Tenderness present.     Cervical back: Normal range of motion.     Comments: Pt rises from seated position to standing without difficulty. Concordant low back pain with facet loading, lumbar spine extension and rotation. Strong distal strength without clonus, no pain upon palpation of greater trochanters. Sensation intact bilaterally. Walks independently, gait steady. Positive slump test.    Skin:    General: Skin is warm and dry.     Capillary Refill: Capillary refill takes less than 2 seconds.  Neurological:     General: No focal deficit present.     Mental Status: He is alert and oriented to person, place, and time.  Psychiatric:        Mood and Affect: Mood normal.        Behavior: Behavior normal.     Ortho Exam  Imaging: No results found.  Past Medical/Family/Surgical/Social History: Medications & Allergies reviewed per EMR, new medications updated. Patient Active Problem List   Diagnosis Date Noted   Benign prostatic hyperplasia without lower urinary tract symptoms 09/09/2017   Mild obesity 09/26/2014   Essential hypertension 02/26/2013   Hyperlipidemia with target LDL less than 70 08/23/2012    History of melanoma 07/28/2011   ASHD (arteriosclerotic heart disease) 07/28/2011   Past Medical History:  Diagnosis Date   ASHD (arteriosclerotic heart disease)    stent   Dyslipidemia    History of stress test 09/2011   Which showed his previously documented scarring anteriorly and apically, concordant with his LAD infarction. Post-stress EF 40%   Hx of echocardiogram    showed an EF of approximately 40% with moderate-to-severe anterior wall hypokenisis and moderate-to-severe apical wall hypokinesis. He did have mild aortic sclerosis. There was mild pulmonary hypertensin with estimated RV systolic pressure of 34 mm.   Hyperlipidemia    Melanoma ([place])    torso   MI (myocardial infarction) ([place]) 2004, 2009   Large Anterior wall, at which he underwent stenting of his promixal occluded LAD with ultimate insertion of a 3.5 x 28 mm Cypher stent post dilated to 4.1 cm.  Family History  Problem Relation Age of Onset   Heart disease Father        5 way bypass   Colon cancer Maternal Grandfather        dx in his 38's   Crohn's disease Brother    Past Surgical History:  Procedure Laterality Date   CARDIAC CATHETERIZATION  06/2007    revealed a progressive disease in the mid LAD, and he underwent insertion of a new mid-LAD stents as well stenting of his PLA branch of his right coronary artery with a 2.75 x 30 mm Cypher stent post dilated 3.25 mm. He did also have some mild additional concomitant CAD.   COLONOSCOPY     CORONARY ANGIOPLASTY WITH STENT PLACEMENT     CORONARY STENT INTERVENTION N/A 11/30/2017   Procedure: CORONARY STENT INTERVENTION;  Surgeon: Jettie Booze, MD;  Location: [facility];  Service: Cardiovascular;  Laterality: N/A;   LEFT HEART CATH AND CORONARY ANGIOGRAPHY N/A 11/30/2017   Procedure: LEFT HEART CATH AND CORONARY ANGIOGRAPHY;  Surgeon: Jettie Booze, MD;  Location: [facility];  Service: Cardiovascular;  Laterality: N/A;   Social  History   Occupational History   Occupation: retired [employer]  Tobacco Use   Smoking status: Never   Smokeless tobacco: Never  Vaping Use   Vaping Use: Never used  Substance and Sexual Activity   Alcohol use: Yes    Alcohol/week: 0.0 standard drinks of alcohol    Comment: occ.   Drug use: No   Sexual activity: Yes

## 2021-07-16 ENCOUNTER — Telehealth: Payer: Self-pay | Admitting: Family Medicine

## 2021-07-16 NOTE — Telephone Encounter (Signed)
Shena from [facility] called and says they cant give him the interlaminar lumbar epidural injection because they need him to be off of his plavix for at least 7 days. They need the doctor to approve this. States you can fax the approval at [phone] or send it through epic or their number is [phone].

## 2021-07-17 NOTE — Telephone Encounter (Signed)
Faxed back 07/17/21.

## 2021-08-12 ENCOUNTER — Ambulatory Visit: Payer: Self-pay

## 2021-08-12 ENCOUNTER — Ambulatory Visit (INDEPENDENT_AMBULATORY_CARE_PROVIDER_SITE_OTHER): Payer: Medicare Other | Admitting: Physical Medicine and Rehabilitation

## 2021-08-12 ENCOUNTER — Encounter: Payer: Self-pay | Admitting: Physical Medicine and Rehabilitation

## 2021-08-12 VITALS — BP 120/83 | HR 61

## 2021-08-12 DIAGNOSIS — M5416 Radiculopathy, lumbar region: Secondary | ICD-10-CM

## 2021-08-12 MED ORDER — METHYLPREDNISOLONE ACETATE 80 MG/ML IJ SUSP
80.0000 mg | Freq: Once | INTRAMUSCULAR | Status: AC
  Administered 2021-08-12: 80 mg

## 2021-08-12 NOTE — Progress Notes (Signed)
Pt state lower back pain that travels down his left leg. Pt state walking, standing and laying down makes the pain worse. Pt state he takes over the pain meds and uses heat to help ease his pain.  Numeric Pain Rating Scale and Functional Assessment Average Pain 1   In the last MONTH (on 0-10 scale) has pain interfered with the following?  1. General activity like being  able to carry out your everyday physical activities such as walking, climbing stairs, carrying groceries, or moving a chair?  Rating(9)   +Driver, +BT pt stopped a week ago, -Dye Allergies.

## 2021-08-12 NOTE — Patient Instructions (Signed)

## 2021-08-21 NOTE — Procedures (Signed)
Lumbar Epidural Steroid Injection - Interlaminar Approach with Fluoroscopic Guidance  Patient: Brandon Frederick      Date of Birth: 27-Jun-1955 MRN: [id number] PCP: Denita Lung, MD      Visit Date: 08/12/2021   Universal Protocol:     Consent Given By: the patient  Position: PRONE  Additional Comments: Vital signs were monitored before and after the procedure. Patient was prepped and draped in the usual sterile fashion. The correct patient, procedure, and site was verified.   Injection Procedure Details:   Procedure diagnoses: Lumbar radiculopathy [M54.16]   Meds Administered:  Meds ordered this encounter  Medications   methylPREDNISolone acetate (DEPO-MEDROL) injection 80 mg     Laterality: Left  Location/Site:  L5-S1  Needle: 3.5 in., 20 ga. Tuohy  Needle Placement: Paramedian epidural  Findings:   -Comments: Excellent flow of contrast into the epidural space.  Procedure Details: Using a paramedian approach from the side mentioned above, the region overlying the inferior lamina was localized under fluoroscopic visualization and the soft tissues overlying this structure were infiltrated with 4 ml. of 1% Lidocaine without Epinephrine. The Tuohy needle was inserted into the epidural space using a paramedian approach.   The epidural space was localized using loss of resistance along with counter oblique bi-planar fluoroscopic views.  After negative aspirate for air, blood, and CSF, a 2 ml. volume of Isovue-250 was injected into the epidural space and the flow of contrast was observed. Radiographs were obtained for documentation purposes.    The injectate was administered into the level noted above.   Additional Comments:  The patient tolerated the procedure well Dressing: 2 x 2 sterile gauze and Band-Aid    Post-procedure details: Patient was observed during the procedure. Post-procedure instructions were reviewed.  Patient left the clinic in stable  condition.

## 2021-08-21 NOTE — Progress Notes (Signed)
Brandon Frederick - 66 y.o. male MRN [id number]  Date of birth: 1955-08-15  Office Visit Note: Visit Date: 08/12/2021 PCP: Denita Lung, MD Referred by: Denita Lung, MD  Subjective: Chief Complaint  Patient presents with   Lower Back - Pain   Left Leg - Pain   HPI:  Brandon Frederick is a 66 y.o. male who comes in today at the request of Barnet Pall, FNP for planned Left L5-S1 Lumbar Interlaminar epidural steroid injection with fluoroscopic guidance.  The patient has failed conservative care including home exercise, medications, time and activity modification.  This injection will be diagnostic and hopefully therapeutic.  Please see requesting physician notes for further details and justification.  He will return to taking his Plavix tomorrow as scheduled.   ROS Otherwise per HPI.  Assessment & Plan: Visit Diagnoses:    ICD-10-CM   1. Lumbar radiculopathy  M54.16 XR C-ARM NO REPORT    Epidural Steroid injection    methylPREDNISolone acetate (DEPO-MEDROL) injection 80 mg      Plan: No additional findings.   Meds & Orders:  Meds ordered this encounter  Medications   methylPREDNISolone acetate (DEPO-MEDROL) injection 80 mg    Orders Placed This Encounter  Procedures   XR C-ARM NO REPORT   Epidural Steroid injection    Follow-up: Return in about 3 weeks (around 09/02/2021) for Recheck spine.   Procedures: No procedures performed  Lumbar Epidural Steroid Injection - Interlaminar Approach with Fluoroscopic Guidance  Patient: Brandon Frederick      Date of Birth: 1955/06/28 MRN: [id number] PCP: Denita Lung, MD      Visit Date: 08/12/2021   Universal Protocol:     Consent Given By: the patient  Position: PRONE  Additional Comments: Vital signs were monitored before and after the procedure. Patient was prepped and draped in the usual sterile fashion. The correct patient, procedure, and site was verified.   Injection Procedure Details:   Procedure  diagnoses: Lumbar radiculopathy [M54.16]   Meds Administered:  Meds ordered this encounter  Medications   methylPREDNISolone acetate (DEPO-MEDROL) injection 80 mg     Laterality: Left  Location/Site:  L5-S1  Needle: 3.5 in., 20 ga. Tuohy  Needle Placement: Paramedian epidural  Findings:   -Comments: Excellent flow of contrast into the epidural space.  Procedure Details: Using a paramedian approach from the side mentioned above, the region overlying the inferior lamina was localized under fluoroscopic visualization and the soft tissues overlying this structure were infiltrated with 4 ml. of 1% Lidocaine without Epinephrine. The Tuohy needle was inserted into the epidural space using a paramedian approach.   The epidural space was localized using loss of resistance along with counter oblique bi-planar fluoroscopic views.  After negative aspirate for air, blood, and CSF, a 2 ml. volume of Isovue-250 was injected into the epidural space and the flow of contrast was observed. Radiographs were obtained for documentation purposes.    The injectate was administered into the level noted above.   Additional Comments:  The patient tolerated the procedure well Dressing: 2 x 2 sterile gauze and Band-Aid    Post-procedure details: Patient was observed during the procedure. Post-procedure instructions were reviewed.  Patient left the clinic in stable condition.   Clinical History: No specialty comments available.     Objective:  VS:  HT:    WT:   BMI:     BP:120/83  HR:61bpm  TEMP: ( )  RESP:  Physical Exam Vitals and nursing  note reviewed.  Constitutional:      General: He is not in acute distress.    Appearance: Normal appearance. He is not ill-appearing.  HENT:     Head: Normocephalic and atraumatic.     Right Ear: External ear normal.     Left Ear: External ear normal.     Nose: No congestion.  Eyes:     Extraocular Movements: Extraocular movements intact.   Cardiovascular:     Rate and Rhythm: Normal rate.     Pulses: Normal pulses.  Pulmonary:     Effort: Pulmonary effort is normal. No respiratory distress.  Abdominal:     General: There is no distension.     Palpations: Abdomen is soft.  Musculoskeletal:        General: No tenderness or signs of injury.     Cervical back: Neck supple.     Right lower leg: No edema.     Left lower leg: No edema.     Comments: Patient has good distal strength without clonus.  Skin:    Findings: No erythema or rash.  Neurological:     General: No focal deficit present.     Mental Status: He is alert and oriented to person, place, and time.     Sensory: No sensory deficit.     Motor: No weakness or abnormal muscle tone.     Coordination: Coordination normal.  Psychiatric:        Mood and Affect: Mood normal.        Behavior: Behavior normal.      Imaging: No results found.

## 2021-09-03 ENCOUNTER — Ambulatory Visit: Payer: Medicare Other | Admitting: Physical Medicine and Rehabilitation

## 2021-09-11 ENCOUNTER — Encounter: Payer: Self-pay | Admitting: Internal Medicine

## 2021-09-14 ENCOUNTER — Other Ambulatory Visit: Payer: Self-pay | Admitting: Cardiovascular Disease

## 2021-09-23 ENCOUNTER — Encounter: Payer: Self-pay | Admitting: Physical Medicine and Rehabilitation

## 2021-09-24 ENCOUNTER — Other Ambulatory Visit: Payer: Self-pay | Admitting: Physical Medicine and Rehabilitation

## 2021-09-24 DIAGNOSIS — M5416 Radiculopathy, lumbar region: Secondary | ICD-10-CM

## 2021-09-26 ENCOUNTER — Other Ambulatory Visit: Payer: Self-pay | Admitting: Physical Medicine and Rehabilitation

## 2021-09-26 DIAGNOSIS — M5416 Radiculopathy, lumbar region: Secondary | ICD-10-CM

## 2021-10-04 ENCOUNTER — Ambulatory Visit
Admission: RE | Admit: 2021-10-04 | Discharge: 2021-10-04 | Disposition: A | Discharge status: Home / Self Care | Payer: Medicare Other | Source: Ambulatory Visit | Attending: Physical Medicine and Rehabilitation | Admitting: Physical Medicine and Rehabilitation

## 2021-10-04 ENCOUNTER — Other Ambulatory Visit: Payer: Self-pay | Admitting: Physical Medicine and Rehabilitation

## 2021-10-04 DIAGNOSIS — M5416 Radiculopathy, lumbar region: Secondary | ICD-10-CM

## 2021-10-04 DIAGNOSIS — Z77018 Contact with and (suspected) exposure to other hazardous metals: Secondary | ICD-10-CM

## 2021-10-06 ENCOUNTER — Other Ambulatory Visit: Payer: Medicare Other

## 2021-10-09 ENCOUNTER — Ambulatory Visit (INDEPENDENT_AMBULATORY_CARE_PROVIDER_SITE_OTHER): Payer: Medicare Other | Admitting: Physical Medicine and Rehabilitation

## 2021-10-09 ENCOUNTER — Encounter: Payer: Self-pay | Admitting: Physical Medicine and Rehabilitation

## 2021-10-09 DIAGNOSIS — M4726 Other spondylosis with radiculopathy, lumbar region: Secondary | ICD-10-CM | POA: Diagnosis not present

## 2021-10-09 DIAGNOSIS — M5116 Intervertebral disc disorders with radiculopathy, lumbar region: Secondary | ICD-10-CM

## 2021-10-09 DIAGNOSIS — M47816 Spondylosis without myelopathy or radiculopathy, lumbar region: Secondary | ICD-10-CM

## 2021-10-09 DIAGNOSIS — I2510 Atherosclerotic heart disease of native coronary artery without angina pectoris: Secondary | ICD-10-CM

## 2021-10-09 DIAGNOSIS — M48062 Spinal stenosis, lumbar region with neurogenic claudication: Secondary | ICD-10-CM

## 2021-10-09 DIAGNOSIS — M5416 Radiculopathy, lumbar region: Secondary | ICD-10-CM | POA: Diagnosis not present

## 2021-10-09 NOTE — Progress Notes (Unsigned)
Brandon Frederick - 66 y.o. male MRN [id number]  Date of birth: [birth year]  Office Visit Note: Visit Date: 10/09/2021 PCP: Denita Lung, MD Referred by: Denita Lung, MD  Subjective: No chief complaint on file.  HPI: Brandon Frederick is a 66 y.o. male who comes in today for evaluation of chronic, worsening and severe bilateral lower back pain radiating to right buttock and down right leg to foot. Pain ongoing for several years and is exacerbated by standing and walking. Pain has progressively increased over the last several months. He describes pain as sore, aching and electric shock sensation, currently rates as 7 out of 10. Some relief of pain with home exercise regimen, rest and use of medications. Recent lumbar MRI imaging exhibits severe multifactorial stenosis at the level of L4-L5 that could cause neural compression on either or both sides. There is small left posterolateral disc herniation at L5-S1 with a small fragment migrated caudally adjacent to the left S1 nerve. Patient underwent left L5-S1 interlaminar epidural steroid injection in our office, reports greater than 80% relief of pain for 2-3 weeks. 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 Patient denies focal weakness. Patient denies recent trauma or falls.    Review of Systems  Musculoskeletal:  Positive for back pain.  Neurological:  Positive for tingling. Negative for sensory change, focal weakness and weakness.  All other systems reviewed and are negative.  Otherwise per HPI.  Assessment & Plan: Visit Diagnoses:    ICD-10-CM   1. Lumbar radiculopathy  M54.16 Ambulatory referral to Physical Medicine Rehab    Ambulatory referral to Orthopedic Surgery    2. Intervertebral disc disorders with radiculopathy, lumbar region  M51.16 Ambulatory referral to Physical Medicine Rehab    Ambulatory referral to Orthopedic Surgery    3. Spinal  stenosis of lumbar region with neurogenic claudication  M48.062 Ambulatory referral to Orthopedic Surgery    4. Other spondylosis with radiculopathy, lumbar region  M47.26 Ambulatory referral to Physical Medicine Rehab    Ambulatory referral to Orthopedic Surgery    5. Facet hypertrophy of lumbar region  M47.816 Ambulatory referral to Physical Medicine Rehab    Ambulatory referral to Orthopedic Surgery       Plan: Findings:  Chronic, worsening and severe right-sided lower back pain radiating to buttock and down right leg to foot.  Patient continues to have severe pain despite good conservative therapy such as home exercise regimen, rest and use of medications. I did review recent lumbar MRI with patient today using imaging.  Patient's clinical presentation and exam are consistent with neurogenic claudication as a result of spinal canal stenosis.  There is severe multifactorial spinal stenosis noted at the level of L4-L5 on recent lumbar MRI imaging.  Next step is to perform diagnostic and hopefully therapeutic right L4-L5 interlaminar epidural steroid injection under fluoroscopic guidance.  Patient is currently taking Plavix, we will contact Dr. Redmond School to get permission for him to come off of this medication for injection. If good relief of pain with injection we can repeat this procedure infrequently as needed.  I did speak with patient about possibility of surgical consultation to discuss options, we would like for patient to be evaluated by Dr. Ileene Rubens in our practice. We will proceed with epidural steroid injection and will review consultation note from Dr. Laurance Flatten when available. No red flag symptoms noted upon exam today.  Meds & Orders: No orders of the defined types were placed in this encounter.   Orders Placed This Encounter  Procedures   Ambulatory referral to Physical Medicine Rehab   Ambulatory referral to Orthopedic Surgery    Follow-up: Return for Right L4-L5 interlaminar  epidural steroid injection.   Procedures: No procedures performed      Clinical History: EXAM: MRI LUMBAR SPINE WITHOUT CONTRAST   TECHNIQUE: Multiplanar, multisequence MR imaging of the lumbar spine was performed. No intravenous contrast was administered.   COMPARISON:  Radiography 07/15/2021   FINDINGS: Segmentation:  5 lumbar type vertebral bodies.   Alignment:  Normal   Vertebrae: No fracture or significant focal lesion. Insignificant minor endplate Schmorl's nodes at L3-4.   Conus medullaris and cauda equina: Conus extends to the L1 level. Conus and cauda equina appear normal.   Paraspinal and other soft tissues: Normal   Disc levels:   T12-L1: Normal   L1-2: Mild disc bulge.  No stenosis.   L2-3: Mild to moderate disc bulge. Mild facet and ligamentous hypertrophy. Mild narrowing of the lateral recesses left more than right but without likely neural compression.   L3-4: Moderate disc bulge. Mild facet and ligamentous hypertrophy. Mild narrowing of the lateral recesses but without likely neural compression.   L4-5: Broad-based disc herniation more prominent towards the right. Facet and ligamentous hypertrophy. Prominent dorsal epidural fat. Severe multifactorial stenosis at this level that could cause neural compression on either or both sides. Facet osteoarthritis could contribute to low back pain.   L5-S1: Chronic disc degeneration with loss of disc height. Endplate osteophytes and bulging of the disc more prominent towards the left. There is a small left posterolateral disc herniation with a small fragment migrated caudally to the left of midline adjacent to the left S1 nerve. There is some potential this could focally affect the left S1 nerve. L5 nerves exit without visible compression. Mild facet and ligamentous hypertrophy.   IMPRESSION: 1. L4-5: Broad-based disc herniation more prominent towards the right. Facet and ligamentous hypertrophy.  Prominent dorsal epidural fat. Severe multifactorial stenosis at this level that could cause neural compression on either or both sides. Facet osteoarthritis could contribute additionally to low back pain. 2. L5-S1: Chronic disc degeneration with endplate osteophytes and bulging of the disc more prominent towards the left. Small left posterolateral disc herniation with a small fragment migrated caudally adjacent to the left S1 nerve. Some potential this could focally affect the left S1 nerve. 3. L2-3 and L3-4: Disc bulges with mild facet and ligamentous hypertrophy. Mild narrowing of the lateral recesses but without likely neural compression.     Electronically Signed   By: Nelson Chimes M.D.   On: 10/07/2021 09:20   He reports that he has never smoked. He has never used smokeless tobacco. No results for input(s): "HGBA1C", "LABURIC" in the last 8760 hours.  Objective:  VS:  HT:    WT:   BMI:     BP:   HR: bpm  TEMP: ( )  RESP:  Physical Exam Vitals and nursing note reviewed.  HENT:     Head: Normocephalic and atraumatic.     Right Ear: External ear normal.     Left Ear: External ear normal.     Nose: Nose normal.     Mouth/Throat:     Mouth: Mucous membranes are moist.  Eyes:     Extraocular Movements: Extraocular movements intact.  Cardiovascular:     Rate and Rhythm: Normal rate.  Pulses: Normal pulses.  Pulmonary:     Effort: Pulmonary effort is normal.  Abdominal:     General: Abdomen is flat. There is no distension.  Musculoskeletal:        General: Tenderness present.     Cervical back: Normal range of motion.     Comments: Pt rises from seated position to standing without difficulty. Good lumbar range of motion. Strong distal strength without clonus, no pain upon palpation of greater trochanters. Sensation intact bilaterally. Walks independently, gait steady.   Skin:    General: Skin is warm and dry.     Capillary Refill: Capillary refill takes less than 2  seconds.  Neurological:     General: No focal deficit present.     Mental Status: He is alert and oriented to person, place, and time.  Psychiatric:        Mood and Affect: Mood normal.        Behavior: Behavior normal.     Ortho Exam  Imaging: No results found.  Past Medical/Family/Surgical/Social History: Medications & Allergies reviewed per EMR, new medications updated. Patient Active Problem List   Diagnosis Date Noted   Benign prostatic hyperplasia without lower urinary tract symptoms 09/09/2017   Mild obesity 09/26/2014   Essential hypertension 02/26/2013   Hyperlipidemia with target LDL less than 70 08/23/2012   History of melanoma 07/28/2011   ASHD (arteriosclerotic heart disease) 07/28/2011   Past Medical History:  Diagnosis Date   ASHD (arteriosclerotic heart disease)    stent   Dyslipidemia    History of stress test 09/2011   Which showed his previously documented scarring anteriorly and apically, concordant with his LAD infarction. Post-stress EF 40%   Hx of echocardiogram    showed an EF of approximately 40% with moderate-to-severe anterior wall hypokenisis and moderate-to-severe apical wall hypokinesis. He did have mild aortic sclerosis. There was mild pulmonary hypertensin with estimated RV systolic pressure of 34 mm.   Hyperlipidemia    Melanoma ([place])    torso   MI (myocardial infarction) ([place]) 2004, 2009   Large Anterior wall, at which he underwent stenting of his promixal occluded LAD with ultimate insertion of a 3.5 x 28 mm Cypher stent post dilated to 4.1 cm.   Family History  Problem Relation Age of Onset   Heart disease Father        5 way bypass   Colon cancer Maternal Grandfather        dx in his 11's   Crohn's disease Brother    Past Surgical History:  Procedure Laterality Date   CARDIAC CATHETERIZATION  06/2007    revealed a progressive disease in the mid LAD, and he underwent insertion of a new mid-LAD stents as well stenting of his PLA  branch of his right coronary artery with a 2.75 x 30 mm Cypher stent post dilated 3.25 mm. He did also have some mild additional concomitant CAD.   COLONOSCOPY     CORONARY ANGIOPLASTY WITH STENT PLACEMENT     CORONARY STENT INTERVENTION N/A 11/30/2017   Procedure: CORONARY STENT INTERVENTION;  Surgeon: Jettie Booze, MD;  Location: [facility];  Service: Cardiovascular;  Laterality: N/A;   LEFT HEART CATH AND CORONARY ANGIOGRAPHY N/A 11/30/2017   Procedure: LEFT HEART CATH AND CORONARY ANGIOGRAPHY;  Surgeon: Jettie Booze, MD;  Location: [facility];  Service: Cardiovascular;  Laterality: N/A;   Social History   Occupational History   Occupation: retired [employer]  Tobacco Use  Smoking status: Never   Smokeless tobacco: Never  Vaping Use   Vaping Use: Never used  Substance and Sexual Activity   Alcohol use: Yes    Alcohol/week: 0.0 standard drinks of alcohol    Comment: occ.   Drug use: No   Sexual activity: Yes

## 2021-10-14 ENCOUNTER — Encounter: Payer: Self-pay | Admitting: Orthopedic Surgery

## 2021-10-14 ENCOUNTER — Ambulatory Visit: Payer: Self-pay

## 2021-10-14 ENCOUNTER — Ambulatory Visit (INDEPENDENT_AMBULATORY_CARE_PROVIDER_SITE_OTHER): Payer: Medicare Other | Admitting: Orthopedic Surgery

## 2021-10-14 VITALS — BP 122/84 | HR 73 | Ht 74.0 in | Wt 262.0 lb

## 2021-10-14 DIAGNOSIS — M5416 Radiculopathy, lumbar region: Secondary | ICD-10-CM | POA: Diagnosis not present

## 2021-10-14 NOTE — Progress Notes (Signed)
Orthopedic Spine Surgery Office Note  Assessment: Patient is a 66 y.o. male with lumbar stenosis and symptoms consistent with neurogenic claudication.  He has an MRI with stenosis at L3-4 and L4-5.  He has had a good response from a steroid injection.   Plan: -Explained that initially conservative treatment is tried to see what relief can be gained with these non-surgical treatment modalities. Discussed that the conservative treatments include:  -activity modification  -physical therapy  -over the counter pain medications  -medrol dosepak -Patient has tried activity modification, Tylenol, NSAIDs, steroid injection -Explained that surgery would be an option should his pain persist in spite of conservative treatments -He is possibly moving soon and does not want to consider surgery at this time.  I discussed L3-4 and L4-5 laminectomy as an option if conservative treatment does not help him and he would elect to undergo surgery for this problem. -He wants to try another injection which he already has an appointment -Patient should return to office in 4 weeks, repeat x-rays of lumbar spine at next visit: None   Patient expressed understanding of the plan and all questions were answered to their satisfaction.   ___________________________________________________________________________   History:  Patient is a 66 y.o. male who presents today for lumbar spine.  Patient has had onset of low back and bilateral leg pain within the last 3 months.  Pain is felt in the lower back and buttocks and then radiates down the posterior thighs.  Sometimes he gets sharp stabbing pain in the thighs.  Usually notices it if he is walking or standing for a while.  He feels it gets better if he sits down or bends over.  There is no trauma or injury that brought on the pain.   Weakness: Denies Symptoms of imbalance: Denies Paresthesias and numbness: Denies Bowel or bladder incontinence: Denies Saddle  anesthesia: Denies  Symptoms:  -leg cramping: Denies  -leg heaviness: Denies  -with improvement upon flexion of back: Yes Can walk for a couple minutes before symptoms start  Treatments tried: Tylenol, NSAIDs, steroid injection (got a couple weeks of good relief with the steroid injection)   Review of systems: Denies fevers and chills, night sweats, unexplained weight loss, history of cancer, pain that wakes them at night  Past medical history: Melanoma Myocardial infarction Coronary artery disease  Allergies: None  Past surgical history:  Cardiac stent placement Right foot hammertoe surgery  Social history: Denies use of nicotine product (smoking, vaping, patches, smokeless) Alcohol use: Denies Denies recreational drug use   Physical Exam:  General: no acute distress, appears stated age Neurologic: alert, answering questions appropriately, following commands Respiratory: unlabored breathing on room air, symmetric chest rise Psychiatric: appropriate affect, normal cadence to speech Vascular: palpable dorsalis pedis pulses bilaterally, feet warm and well perfused   MSK (spine):  -Strength exam      Left  Right EHL    5/5  5/5 TA    5/5  5/5 GSC    5/5  5/5 Knee extension  5/5  5/5 Hip flexion   5/5  5/5  -Sensory exam    Sensation intact to light touch in L3-S1 nerve distributions of bilateral lower extremities  -Achilles DTR: 1/4 on the left, 1/4 on the right -Patellar tendon DTR: 1/4 on the left, 1/4 on the right  Left hip exam: no pain through range of motion, negative stinchfield, negative FABER, negative Gaenslen's, negative SI compression test, negative SI distraction test Right hip exam: No pain through range  of motion, negative Stinchfield, negative Faber, negative Gaenslen's, negative SI compression test, negative SI distraction test  -Straight leg raise: negative bilaterally -Clonus: no beats bilaterally  Imaging: X-ray of the lumbar spine  from 10/09/0 23 and 07/15/2021 was independently reviewed and interpreted, showing disc height loss at L2-3 and L5-S1.  No evidence of instability.  No acute osseous abnormality  MRI of the lumbar spine from 10/04/2021 was independently reviewed and interpreted, showing disc herniation at L4-5, central lateral recess stenosis at L4-5, lateral recess stenosis at L3-4.  Degenerative disc disease at L4-5 and L5-S1.  Disc herniation at L5-S1 is more on the left side.   Patient name: Brandon Frederick Patient MRN: [id number] Date of visit: 10/14/21

## 2021-10-15 ENCOUNTER — Telehealth: Payer: Self-pay

## 2021-10-15 NOTE — Telephone Encounter (Signed)
Patient calling back to schedule for injection with Dr. Ernestina Patches. CB# [phone],.  Please advise.

## 2021-10-16 ENCOUNTER — Telehealth: Payer: Self-pay | Admitting: Physical Medicine and Rehabilitation

## 2021-10-16 NOTE — Telephone Encounter (Signed)
IC and scheduled.

## 2021-10-16 NOTE — Telephone Encounter (Signed)
Pt called to set an appt for Dr Ernestina Patches for an injection. Please call pt at [phone].

## 2021-10-17 ENCOUNTER — Encounter: Payer: Self-pay | Admitting: Orthopedic Surgery

## 2021-10-22 NOTE — Telephone Encounter (Signed)
Orthopedic Surgery Telephone Call Note  I spoke with Mr. Buffone I spoke with Mr. Roig today around 9:30 AM.  At his initial appointment visit in the office, he was planning on moving soon so he did not want to consider surgery at that time.  After talking with his wife further, he reconsidered and thought surgery may be a better option for him at this time.  He wanted to discuss the surgery again with the presence of his wife.  Accordingly, a phone call was arranged for today.  We went over his symptoms again and I discussed his MRI findings.  I explained that his leg pain that is worse with activity and improves with flexion of the back and sitting is consistent with neurogenic claudication.  Since he has not had lasting relief with conservative treatments, I discussed L3/4 and L4/5 laminectomy as a treatment option.  I answered his and his wife's questions about the events leading up to surgery and the recovery after surgery.  I discussed the risks of surgery including but not limited to iatrogenic instability, dural tear, nerve root injury, paralysis, persistent pain, infection, bleeding, fracture, and need for additional procedures. The benefit of the surgery would be relief of the patient's symptoms related to the lumbar stenosis.  This relief would be more targeted towards the leg pain that he gets with activity.  I explained that the surgery is not done for back pain and back pain is not reliably alleviated with the surgery.  After this discussion, patient wanted to proceed with surgery.  I told him my surgery scheduler, April, would be getting in contact with him to work towards a surgery date.  He stated he already has a appointment with his primary care provider for preoperative clearance.  All the patient's questions were answered to his and his wife's satisfaction.  I told him if any other questions arise, he should not hesitate to get back in contact with me either by calling the office or using  MyChart.  He will next be seen at the date of surgery.  Callie Fielding, MD Orthopedic Surgeon

## 2021-10-28 ENCOUNTER — Ambulatory Visit: Payer: Self-pay

## 2021-10-28 ENCOUNTER — Encounter: Payer: Self-pay | Admitting: Orthopedic Surgery

## 2021-10-28 ENCOUNTER — Encounter: Payer: Self-pay | Admitting: Cardiovascular Disease

## 2021-10-28 ENCOUNTER — Encounter: Payer: Self-pay | Admitting: Internal Medicine

## 2021-10-28 ENCOUNTER — Encounter: Payer: Self-pay | Admitting: Family Medicine

## 2021-10-28 ENCOUNTER — Ambulatory Visit (INDEPENDENT_AMBULATORY_CARE_PROVIDER_SITE_OTHER): Payer: Medicare Other | Admitting: Family Medicine

## 2021-10-28 ENCOUNTER — Ambulatory Visit (INDEPENDENT_AMBULATORY_CARE_PROVIDER_SITE_OTHER): Payer: Medicare Other | Admitting: Physical Medicine and Rehabilitation

## 2021-10-28 VITALS — BP 137/88 | HR 50

## 2021-10-28 VITALS — BP 120/82 | HR 63 | Temp 98.3°F | Wt 256.2 lb

## 2021-10-28 DIAGNOSIS — M5416 Radiculopathy, lumbar region: Secondary | ICD-10-CM

## 2021-10-28 DIAGNOSIS — E669 Obesity, unspecified: Secondary | ICD-10-CM

## 2021-10-28 DIAGNOSIS — M545 Low back pain, unspecified: Secondary | ICD-10-CM | POA: Diagnosis not present

## 2021-10-28 DIAGNOSIS — Z01818 Encounter for other preprocedural examination: Secondary | ICD-10-CM | POA: Diagnosis not present

## 2021-10-28 DIAGNOSIS — I2510 Atherosclerotic heart disease of native coronary artery without angina pectoris: Secondary | ICD-10-CM

## 2021-10-28 MED ORDER — METHYLPREDNISOLONE ACETATE 80 MG/ML IJ SUSP
40.0000 mg | Freq: Once | INTRAMUSCULAR | Status: AC
  Administered 2021-10-28: 40 mg

## 2021-10-28 NOTE — Patient Instructions (Signed)

## 2021-10-28 NOTE — Progress Notes (Signed)
Numeric Pain Rating Scale and Functional Assessment Average Pain 2   In the last MONTH (on 0-10 scale) has pain interfered with the following?  1. General activity like being  able to carry out your everyday physical activities such as walking, climbing stairs, carrying groceries, or moving a chair?  Rating( varies after activities )   +Driver, -BT- Plavix (stopped 7 days ago), -Dye Allergies.  Pain radiates down both legs. Tylenol and Advil for pain

## 2021-10-28 NOTE — Progress Notes (Signed)
   Subjective:    Patient ID: Brandon Frederick, male    DOB: 19-Jan-1955, 66 y.o.   MRN: [id number]  HPI He is here for preoperative evaluation for pending lumbar surgery.  He is having L3-4 and L4-5 surgery for disc problems.  He does have an underlying history of ASHD with his last stent placed in 2016.  He follows up regularly with cardiology.  He has had no chest pain, shortness of breath, PND or DOE.  He does not smoke.  He has noted a slow weight gain on a yearly basis.   Review of Systems     Objective:   Physical Exam Alert and in no distress.  Cardiac exam shows regular rhythm without murmurs or gallops.  Lungs are clear to auscultation.       Assessment & Plan:  Low back pain, unspecified back pain laterality, unspecified chronicity, unspecified whether sciatica present  ASHD (arteriosclerotic heart disease)  Mild obesity  Preoperative examination He is cleared for surgery pending cardiac evaluation.  Then discussed weight loss with him in regard to exercising as per orthopedic recommendation and also work on cutting back on carbohydrates.

## 2021-11-04 NOTE — Progress Notes (Signed)
Brandon Frederick - 66 y.o. male MRN [id number]  Date of birth: 1955/03/19  Office Visit Note: Visit Date: 10/28/2021 PCP: Denita Lung, MD Referred by: Denita Lung, MD  Subjective: Chief Complaint  Patient presents with   Lower Back - Pain   HPI:  Brandon Frederick is a 66 y.o. male who comes in today at the request of Barnet Pall, FNP for planned Right L4-5 Lumbar Interlaminar epidural steroid injection with fluoroscopic guidance.  The patient has failed conservative care including home exercise, medications, time and activity modification.  This injection will be diagnostic and hopefully therapeutic.  Please see requesting physician notes for further details and justification.   ROS Otherwise per HPI.  Assessment & Plan: Visit Diagnoses:    ICD-10-CM   1. Lumbar radiculopathy  M54.16 XR C-ARM NO REPORT    Epidural Steroid injection    methylPREDNISolone acetate (DEPO-MEDROL) injection 40 mg      Plan: No additional findings.   Meds & Orders:  Meds ordered this encounter  Medications   methylPREDNISolone acetate (DEPO-MEDROL) injection 40 mg    Orders Placed This Encounter  Procedures   XR C-ARM NO REPORT   Epidural Steroid injection    Follow-up: Return for visit to requesting provider as needed.   Procedures: No procedures performed  Lumbar Epidural Steroid Injection - Interlaminar Approach with Fluoroscopic Guidance  Patient: Brandon Frederick      Date of Birth: Apr 18, 1955 MRN: [id number] PCP: Denita Lung, MD      Visit Date: 10/28/2021   Universal Protocol:     Consent Given By: the patient  Position: PRONE  Additional Comments: Vital signs were monitored before and after the procedure. Patient was prepped and draped in the usual sterile fashion. The correct patient, procedure, and site was verified.   Injection Procedure Details:   Procedure diagnoses: Lumbar radiculopathy [M54.16]   Meds Administered:  Meds ordered this encounter   Medications   methylPREDNISolone acetate (DEPO-MEDROL) injection 40 mg     Laterality: Right  Location/Site:  L4-5  Needle: 3.5 in., 20 ga. Tuohy  Needle Placement: Paramedian epidural  Findings:   -Comments: Excellent flow of contrast into the epidural space.  Procedure Details: Using a paramedian approach from the side mentioned above, the region overlying the inferior lamina was localized under fluoroscopic visualization and the soft tissues overlying this structure were infiltrated with 4 ml. of 1% Lidocaine without Epinephrine. The Tuohy needle was inserted into the epidural space using a paramedian approach.   The epidural space was localized using loss of resistance along with counter oblique bi-planar fluoroscopic views.  After negative aspirate for air, blood, and CSF, a 2 ml. volume of Isovue-250 was injected into the epidural space and the flow of contrast was observed. Radiographs were obtained for documentation purposes.    The injectate was administered into the level noted above.   Additional Comments:  The patient tolerated the procedure well Dressing: 2 x 2 sterile gauze and Band-Aid    Post-procedure details: Patient was observed during the procedure. Post-procedure instructions were reviewed.  Patient left the clinic in stable condition.   Clinical History: EXAM: MRI LUMBAR SPINE WITHOUT CONTRAST   TECHNIQUE: Multiplanar, multisequence MR imaging of the lumbar spine was performed. No intravenous contrast was administered.   COMPARISON:  Radiography 07/15/2021   FINDINGS: Segmentation:  5 lumbar type vertebral bodies.   Alignment:  Normal   Vertebrae: No fracture or significant focal lesion. Insignificant minor endplate Schmorl's  nodes at L3-4.   Conus medullaris and cauda equina: Conus extends to the L1 level. Conus and cauda equina appear normal.   Paraspinal and other soft tissues: Normal   Disc levels:   T12-L1: Normal   L1-2: Mild  disc bulge.  No stenosis.   L2-3: Mild to moderate disc bulge. Mild facet and ligamentous hypertrophy. Mild narrowing of the lateral recesses left more than right but without likely neural compression.   L3-4: Moderate disc bulge. Mild facet and ligamentous hypertrophy. Mild narrowing of the lateral recesses but without likely neural compression.   L4-5: Broad-based disc herniation more prominent towards the right. Facet and ligamentous hypertrophy. Prominent dorsal epidural fat. Severe multifactorial stenosis at this level that could cause neural compression on either or both sides. Facet osteoarthritis could contribute to low back pain.   L5-S1: Chronic disc degeneration with loss of disc height. Endplate osteophytes and bulging of the disc more prominent towards the left. There is a small left posterolateral disc herniation with a small fragment migrated caudally to the left of midline adjacent to the left S1 nerve. There is some potential this could focally affect the left S1 nerve. L5 nerves exit without visible compression. Mild facet and ligamentous hypertrophy.   IMPRESSION: 1. L4-5: Broad-based disc herniation more prominent towards the right. Facet and ligamentous hypertrophy. Prominent dorsal epidural fat. Severe multifactorial stenosis at this level that could cause neural compression on either or both sides. Facet osteoarthritis could contribute additionally to low back pain. 2. L5-S1: Chronic disc degeneration with endplate osteophytes and bulging of the disc more prominent towards the left. Small left posterolateral disc herniation with a small fragment migrated caudally adjacent to the left S1 nerve. Some potential this could focally affect the left S1 nerve. 3. L2-3 and L3-4: Disc bulges with mild facet and ligamentous hypertrophy. Mild narrowing of the lateral recesses but without likely neural compression.     Electronically Signed   By: Nelson Chimes M.D.    On: 10/07/2021 09:20     Objective:  VS:  HT:    WT:   BMI:     BP:137/88  HR:(!) 50bpm  TEMP: ( )  RESP:  Physical Exam Vitals and nursing note reviewed.  Constitutional:      General: He is not in acute distress.    Appearance: Normal appearance. He is not ill-appearing.  HENT:     Head: Normocephalic and atraumatic.     Right Ear: External ear normal.     Left Ear: External ear normal.     Nose: No congestion.  Eyes:     Extraocular Movements: Extraocular movements intact.  Cardiovascular:     Rate and Rhythm: Normal rate.     Pulses: Normal pulses.  Pulmonary:     Effort: Pulmonary effort is normal. No respiratory distress.  Abdominal:     General: There is no distension.     Palpations: Abdomen is soft.  Musculoskeletal:        General: No tenderness or signs of injury.     Cervical back: Neck supple.     Right lower leg: No edema.     Left lower leg: No edema.     Comments: Patient has good distal strength without clonus.  Skin:    Findings: No erythema or rash.  Neurological:     General: No focal deficit present.     Mental Status: He is alert and oriented to person, place, and time.     Sensory:  No sensory deficit.     Motor: No weakness or abnormal muscle tone.     Coordination: Coordination normal.  Psychiatric:        Mood and Affect: Mood normal.        Behavior: Behavior normal.      Imaging: No results found.

## 2021-11-04 NOTE — Procedures (Signed)
Lumbar Epidural Steroid Injection - Interlaminar Approach with Fluoroscopic Guidance  Patient: Brandon Frederick      Date of Birth: 27-Feb-1955 MRN: [id number] PCP: Denita Lung, MD      Visit Date: 10/28/2021   Universal Protocol:     Consent Given By: the patient  Position: PRONE  Additional Comments: Vital signs were monitored before and after the procedure. Patient was prepped and draped in the usual sterile fashion. The correct patient, procedure, and site was verified.   Injection Procedure Details:   Procedure diagnoses: Lumbar radiculopathy [M54.16]   Meds Administered:  Meds ordered this encounter  Medications   methylPREDNISolone acetate (DEPO-MEDROL) injection 40 mg     Laterality: Right  Location/Site:  L4-5  Needle: 3.5 in., 20 ga. Tuohy  Needle Placement: Paramedian epidural  Findings:   -Comments: Excellent flow of contrast into the epidural space.  Procedure Details: Using a paramedian approach from the side mentioned above, the region overlying the inferior lamina was localized under fluoroscopic visualization and the soft tissues overlying this structure were infiltrated with 4 ml. of 1% Lidocaine without Epinephrine. The Tuohy needle was inserted into the epidural space using a paramedian approach.   The epidural space was localized using loss of resistance along with counter oblique bi-planar fluoroscopic views.  After negative aspirate for air, blood, and CSF, a 2 ml. volume of Isovue-250 was injected into the epidural space and the flow of contrast was observed. Radiographs were obtained for documentation purposes.    The injectate was administered into the level noted above.   Additional Comments:  The patient tolerated the procedure well Dressing: 2 x 2 sterile gauze and Band-Aid    Post-procedure details: Patient was observed during the procedure. Post-procedure instructions were reviewed.  Patient left the clinic in stable  condition.

## 2021-11-05 ENCOUNTER — Encounter: Payer: Self-pay | Admitting: Orthopedic Surgery

## 2021-11-06 ENCOUNTER — Telehealth: Payer: Self-pay

## 2021-11-06 NOTE — Telephone Encounter (Signed)
   Pre-operative Risk Assessment    Patient Name: Brandon Frederick  DOB: 01-11-1955 MRN: [id number]      Request for Surgical Clearance    Procedure:   L3-L4 &  L4-5  LAMINECTOMY  Date of Surgery:  Clearance TBD                                 Surgeon:  DR Ileene Rubens Surgeon's Group or Practice Name:  [facility] Phone number:  [phone] Fax number:  [phone]   Type of Clearance Requested:   - Pharmacy:  Hold Clopidogrel (Plavix) AND ASA . OKAY TO STOP PLAVIX 5 DAYS PRIOR AND ASA 7 DAYS PRIOR TO SURGERY ?   Type of Anesthesia:  Not Indicated   Additional requests/questions:  Please advise surgeon/provider what medications should be held.  Signed, Jeanmarie Plant Lance [place]  CCMA 11/06/2021, 11:59 AM

## 2021-11-14 NOTE — Telephone Encounter (Addendum)
   Primary Cardiologist: Shelva Majestic, MD  Chart reviewed as part of pre-operative protocol coverage. Given past medical history and time since last visit, based on ACC/AHA guidelines, Brandon Frederick would be at acceptable risk for the planned procedure without further cardiovascular testing.   I spoke with the patient and he is not having any concerning cardiac symptoms. He is able to achieve > 4 METS on a consistent basis.   I will route this recommendation to the requesting party via Epic fax function and remove from pre-op pool.  Per Dr. Claiborne Billings, he may hold Plavix for 5 days and hold aspirin for 7 days prior to procedure. These medications should be resumed as soon as hemodynamically stable following the procedure.   Please call with questions.  Emmaline Life, NP-C  11/14/2021, 8:48 AM [address] Office [phone] Fax [phone]

## 2021-11-14 NOTE — Telephone Encounter (Signed)
OK to hold as above

## 2021-11-18 ENCOUNTER — Ambulatory Visit: Payer: Medicare Other | Admitting: Orthopedic Surgery

## 2021-11-20 NOTE — Pre-Procedure Instructions (Addendum)
Surgical Instructions    Your procedure is scheduled on December 02, 2021.  Report to [facility] Main Entrance "A" at 5:30 A.M., then check in with the Admitting office.  Call this number if you have problems the morning of surgery:  [phone]   If you have any questions prior to your surgery date call [phone]: Open Monday-Friday 8am-4pm If you experience any cold or flu symptoms such as cough, fever, chills, shortness of breath, etc. between now and your scheduled surgery, please notify us at the above number     Remember:  Do not eat after midnight the night before your surgery  You may drink clear liquids until 4:30 AM the morning of your surgery.   Clear liquids allowed are: Water, Non-Citrus Juices (without pulp), Carbonated Beverages, Clear Tea, Black Coffee Only (NO MILK, CREAM OR POWDERED CREAMER of any kind), and Gatorade.  Patient Instructions  The night before surgery:  No food after midnight. ONLY clear liquids after midnight  The day of surgery (if you do NOT have diabetes):  Drink ONE (1) Pre-Surgery Clear Ensure by 4:30 AM the morning of surgery. Drink in one sitting. Do not sip.  This drink was given to you during your hospital  pre-op appointment visit.  Nothing else to drink after completing the  Pre-Surgery Clear Ensure.         If you have questions, please contact your surgeon's office.      Take these medicines the morning of surgery with A SIP OF WATER:  finasteride (PROSCAR)   metoprolol succinate (TOPROL-XL)     Take these medicines the morning of surgery with a sip of water AS NEEDED:  acetaminophen (TYLENOL)  loratadine (CLARITIN REDITABS)   nitroGLYCERIN (NITROSTAT)    STOP taking your aspirin 7 days prior to surgery. Your lase dose of this medication will be November 19th.  STOP taking your clopidogrel (PLAVIX) 5 days prior to surgery. Your last dose of this medication will be November 21st.   As of today, STOP taking any Aleve,  Naproxen, Ibuprofen, Motrin, Advil, Goody's, BC's, all herbal medications, fish oil, and all vitamins.                     Do NOT Smoke (Tobacco/Vaping) for 24 hours prior to your procedure.  If you use a CPAP at night, you may bring your mask/headgear for your overnight stay.   Contacts, glasses, piercing's, hearing aid's, dentures or partials may not be worn into surgery, please bring cases for these belongings.    For patients admitted to the hospital, discharge time will be determined by your treatment team.   Patients discharged the day of surgery will not be allowed to drive home, and someone needs to stay with them for 24 hours.  SURGICAL WAITING ROOM VISITATION Patients having surgery or a procedure may have no more than 2 support people in the waiting area - these visitors may rotate.   Children under the age of 51 must have an adult with them who is not the patient. If the patient needs to stay at the hospital during part of their recovery, the visitor guidelines for inpatient rooms apply. Pre-op nurse will coordinate an appropriate time for 1 support person to accompany patient in pre-op.  This support person may not rotate.   Please refer to the [facility] website for the visitor guidelines for Inpatients (after your surgery is over and you are in a regular room).    Special instructions:  [place]- Preparing For Surgery  Before surgery, you can play an important role. Because skin is not sterile, your skin needs to be as free of germs as possible. You can reduce the number of germs on your skin by washing with CHG (chlorahexidine gluconate) Soap before surgery.  CHG is an antiseptic cleaner which kills germs and bonds with the skin to continue killing germs even after washing.    Oral Hygiene is also important to reduce your risk of infection.  Remember - BRUSH YOUR TEETH THE MORNING OF SURGERY WITH YOUR REGULAR TOOTHPASTE  Please do not use if you have an allergy to CHG  or antibacterial soaps. If your skin becomes reddened/irritated stop using the CHG.  Do not shave (including legs and underarms) for at least 48 hours prior to first CHG shower. It is OK to shave your face.  Please follow these instructions carefully.   Shower the NIGHT BEFORE SURGERY and the MORNING OF SURGERY  If you chose to wash your hair, wash your hair first as usual with your normal shampoo.  After you shampoo, rinse your hair and body thoroughly to remove the shampoo.  Use CHG Soap as you would any other liquid soap. You can apply CHG directly to the skin and wash gently with a scrungie or a clean washcloth.   Apply the CHG Soap to your body ONLY FROM THE NECK DOWN.  Do not use on open wounds or open sores. Avoid contact with your eyes, ears, mouth and genitals (private parts). Wash Face and genitals (private parts)  with your normal soap.   Wash thoroughly, paying special attention to the area where your surgery will be performed.  Thoroughly rinse your body with warm water from the neck down.  DO NOT shower/wash with your normal soap after using and rinsing off the CHG Soap.  Pat yourself dry with a CLEAN TOWEL.  Wear CLEAN PAJAMAS to bed the night before surgery  Place CLEAN SHEETS on your bed the night before your surgery  DO NOT SLEEP WITH PETS.   Day of Surgery: Take a shower with CHG soap. Do not wear jewelry or makeup Do not wear lotions, powders, perfumes/colognes, or deodorant. Do not shave 48 hours prior to surgery.  Men may shave face and neck. Do not bring valuables to the hospital.  [facility] is not responsible for any belongings or valuables. Do not wear nail polish, gel polish, artificial nails, or any other type of covering on natural nails (fingers and toes) If you have artificial nails or gel coating that need to be removed by a nail salon, please have this removed prior to surgery. Artificial nails or gel coating may interfere with anesthesia's  ability to adequately monitor your vital signs.  Wear Clean/Comfortable clothing the morning of surgery Remember to brush your teeth WITH YOUR REGULAR TOOTHPASTE.   Please read over the following fact sheets that you were given.    If you received a COVID test during your pre-op visit  it is requested that you wear a mask when out in public, stay away from anyone that may not be feeling well and notify your surgeon if you develop symptoms. If you have been in contact with anyone that has tested positive in the last 10 days please notify you surgeon.

## 2021-11-21 ENCOUNTER — Other Ambulatory Visit: Payer: Self-pay

## 2021-11-21 ENCOUNTER — Encounter (HOSPITAL_COMMUNITY): Payer: Self-pay

## 2021-11-21 ENCOUNTER — Encounter (HOSPITAL_COMMUNITY)
Admission: RE | Admit: 2021-11-21 | Discharge: 2021-11-21 | Disposition: A | Discharge status: Home / Self Care | Payer: Medicare Other | Source: Ambulatory Visit | Attending: Orthopedic Surgery | Admitting: Orthopedic Surgery

## 2021-11-21 VITALS — BP 131/72 | HR 72 | Temp 97.8°F | Resp 18 | Ht 75.0 in | Wt 257.6 lb

## 2021-11-21 DIAGNOSIS — I2582 Chronic total occlusion of coronary artery: Secondary | ICD-10-CM | POA: Diagnosis not present

## 2021-11-21 DIAGNOSIS — Z01812 Encounter for preprocedural laboratory examination: Secondary | ICD-10-CM | POA: Insufficient documentation

## 2021-11-21 DIAGNOSIS — Z8673 Personal history of transient ischemic attack (TIA), and cerebral infarction without residual deficits: Secondary | ICD-10-CM | POA: Diagnosis not present

## 2021-11-21 DIAGNOSIS — I252 Old myocardial infarction: Secondary | ICD-10-CM | POA: Diagnosis not present

## 2021-11-21 DIAGNOSIS — M48061 Spinal stenosis, lumbar region without neurogenic claudication: Secondary | ICD-10-CM | POA: Insufficient documentation

## 2021-11-21 DIAGNOSIS — Z01818 Encounter for other preprocedural examination: Secondary | ICD-10-CM

## 2021-11-21 HISTORY — DX: Unspecified osteoarthritis, unspecified site: M19.90

## 2021-11-21 LAB — CBC
HCT: 46.8 % (ref 39.0–52.0)
Hemoglobin: 15.6 g/dL (ref 13.0–17.0)
MCH: 32 pg (ref 26.0–34.0)
MCHC: 33.3 g/dL (ref 30.0–36.0)
MCV: 95.9 fL (ref 80.0–100.0)
Platelets: 163 10*3/uL (ref 150–400)
RBC: 4.88 MIL/uL (ref 4.22–5.81)
RDW: 13.7 % (ref 11.5–15.5)
WBC: 8.4 10*3/uL (ref 4.0–10.5)
nRBC: 0 % (ref 0.0–0.2)

## 2021-11-21 LAB — BASIC METABOLIC PANEL
Anion gap: 6 (ref 5–15)
BUN: 21 mg/dL (ref 8–23)
CO2: 24 mmol/L (ref 22–32)
Calcium: 9.3 mg/dL (ref 8.9–10.3)
Chloride: 111 mmol/L (ref 98–111)
Creatinine, Ser: 1.33 mg/dL — ABNORMAL HIGH (ref 0.61–1.24)
GFR, Estimated: 59 mL/min — ABNORMAL LOW (ref >60)
Glucose, Bld: 109 mg/dL — ABNORMAL HIGH (ref 70–99)
Potassium: 4.1 mmol/L (ref 3.5–5.1)
Sodium: 141 mmol/L (ref 135–145)

## 2021-11-21 LAB — SURGICAL PCR SCREEN
MRSA, PCR: NEGATIVE
Staphylococcus aureus: NEGATIVE

## 2021-11-21 NOTE — Progress Notes (Signed)
PCP - Dr. Denita Lung Cardiologist - Dr. Shelva Majestic  PPM/ICD - Denies Device Orders - n/a Rep Notified - n/a  Chest x-ray - n/a EKG - 07/10/2021 Stress Test - 09/25/2011 ECHO - 12/01/2017 Cardiac Cath - 11/30/2017  Sleep Study - Per pt, he has sleep study, but isn't sure what the results were because he wasn't able to really fall asleep during the study. CPAP - n/a  No DM.  Last dose of GLP1 agonist- n/a GLP1 instructions: n/a  Blood Thinner Instructions: Pt will hold Plavix 5 days. Last dose will be 11/21 Aspirin Instructions: Pt will hold ASA 7 days. Last dose will be 11/19  ERAS Protcol - Yes. Clear liquids until 0430 morning of surgery PRE-SURGERY Ensure or G2- Ensure given to patient at PAT appointment  COVID TEST- n/a   Anesthesia review: Yes. Cardiac clearance note 11/06/2021    Patient denies shortness of breath, fever, cough and chest pain at PAT appointment   All instructions explained to the patient, with a verbal understanding of the material. Patient agrees to go over the instructions while at home for a better understanding. Patient also instructed to self quarantine after being tested for COVID-19. The opportunity to ask questions was provided.

## 2021-11-22 NOTE — Anesthesia Preprocedure Evaluation (Addendum)
Anesthesia Evaluation  Patient identified by MRN, date of birth, ID band Patient awake    Reviewed: Allergy & Precautions, NPO status , Patient's Chart, lab work & pertinent test results  History of Anesthesia Complications Negative for: history of anesthetic complications  Airway Mallampati: III  TM Distance: >3 FB Neck ROM: Full    Dental no notable dental hx.    Pulmonary neg pulmonary ROS   Pulmonary exam normal breath sounds clear to auscultation       Cardiovascular hypertension, (-) angina + CAD, + Past MI (2004, 2009) and + Cardiac Stents (on Plaivx and aspirin)  + dysrhythmias (1st degree AV block)  Rhythm:Regular Rate:Normal  TTE 12/01/2017: Study Conclusions   - Left ventricle: The cavity size was mildly dilated. There was    moderate focal basal hypertrophy. Systolic function was mildly    reduced. The estimated ejection fraction was in the range of 45%    to 50%. There is akinesis of the apicalinferior and apical    myocardium. Features are consistent with a pseudonormal left    ventricular filling pattern, with concomitant abnormal relaxation    and increased filling pressure (grade 2 diastolic dysfunction).  - Aortic valve: Trileaflet; normal thickness, mildly calcified    leaflets.  - Aorta: Aortic root dimension: 38 mm (ED).  - Aortic root: The aortic root was mildly dilated.  - Left atrium: The atrium was mildly dilated.   LHC 11/30/2017:  There is mild left ventricular systolic dysfunction. LVEDP 11 mm Hg. The left ventricular ejection fraction is 45-50% by visual estimate. Anterolateral hypokinesis. There is no aortic valve stenosis. Prox LAD lesion is 25% stenosed. Patent LAD stents with mild distal restenosis. Mid LAD lesion is 40% stenosed. Ost 1st Diag lesion is 95% stenosed. Branch of diagonal occluded with left to left collaterals. Ost 1st Mrg lesion is 50% stenosed. Ost Cx to Prox Cx lesion is  75% stenosed. A drug-eluting stent was successfully placed using a STENT SYNERGY DES 3X16. Post intervention, there is a 0% residual stenosis.    Neuro/Psych neg Seizures    GI/Hepatic negative GI ROS, Neg liver ROS,,,  Endo/Other  negative endocrine ROS    Renal/GU negative Renal ROS     Musculoskeletal  (+) Arthritis ,    Abdominal  (+) + obese  Peds  Hematology negative hematology ROS (+)   Anesthesia Other Findings   Reproductive/Obstetrics                             Anesthesia Physical Anesthesia Plan  ASA: 3  Anesthesia Plan: General   Post-op Pain Management:    Induction: Intravenous  PONV Risk Score and Plan: 2 and Ondansetron, Dexamethasone and Treatment may vary due to age or medical condition  Airway Management Planned: Oral ETT  Additional Equipment:   Intra-op Plan:   Post-operative Plan: Extubation in OR  Informed Consent: I have reviewed the patients History and Physical, chart, labs and discussed the procedure including the risks, benefits and alternatives for the proposed anesthesia with the patient or authorized representative who has indicated his/her understanding and acceptance.     Dental advisory given  Plan Discussed with: CRNA and Anesthesiologist  Anesthesia Plan Comments: (Risks of general anesthesia discussed including, but not limited to, sore throat, hoarse voice, chipped/damaged teeth, injury to vocal cords, nausea and vomiting, allergic reactions, lung infection, heart attack, stroke, and death. All questions answered.   PAT note by Jeneen Rinks  Burns, PA-C: Follows with cardiology for history of CVA s/p MI 2004 with total occlusion of the proximal LAD. He is status post stenting of his proximal LAD with a 3.5 x 20 mm Cypher stent postdilated to 4.1 mm., 2009 progressive disease in the mid LAD with additional 2.75 x 18 mm Cypher stent implantation postdilated to 3.35-3.25 taper;the patient has a 2.75 x  13 mm stent in the PLA branch of his RCA and ison life longdual antiplatelet therapy with aspirin and Plavix.  Most recent cardiac catheterization November 2019 revealed 95% first diagonal stenosis and a branch of the diagonal was occluded with left to left collaterals; 75% ostial to proximal circumflex stenosis with successful Synergy drug-eluting stent 3.0 x 16 mm stent to circumflex vessel. Echocardiogram during that time revealed an EF of 45 to 40%, grade 2 diastolic dysfunction, aortic root dimension 38 mm with moderate left atrial dilatation.  Cardiac clearance per telephone encounter 11/14/2021, "Chart reviewed as part of pre-operative protocol coverage. Given past medical history and time since last visit, based on ACC/AHA guidelines,Mounir L Stonewould be at acceptable risk for the planned procedure without further cardiovascular testing. I spoke with the patient and he is not having any concerning cardiac symptoms. He is able to achieve > 4 METS on a consistent basis.I will route this recommendation to the requesting party via Epic fax function and remove from pre-op pool. Per Dr. Claiborne Billings, he may hold Plavix for 5 days and hold aspirin for 7 days prior to procedure. These medications should be resumed as soon as hemodynamically stable following the procedure."  Patient reports last dose Plavix 11/26/2021.  Preop labs reviewed, creatinine mildly elevated 1.33, otherwise unremarkable.  EKG 07/10/2021: Sinus bradycardia with first-degree AV block.  Rate 59.  Low voltage QRS.  Cannot rule out anteroseptal infarct, age undetermined.  T wave abnormality, consider lateral ischemia.  No significant changes from prior.  TTE 12/01/2017: - Left ventricle: The cavity size was mildly dilated. There was  moderate focal basal hypertrophy. Systolic function was mildly  reduced. The estimated ejection fraction was in the range of 45%  to 50%. There is akinesis of the apicalinferior and apical   myocardium. Features are consistent with a pseudonormal left  ventricular filling pattern, with concomitant abnormal relaxation  and increased filling pressure (grade 2 diastolic dysfunction).  - Aortic valve: Trileaflet; normal thickness, mildly calcified  leaflets.  - Aorta: Aortic root dimension: 38 mm (ED).  - Aortic root: The aortic root was mildly dilated.  - Left atrium: The atrium was mildly dilated.   Cath and PCI 11/30/2017: ? There is mild left ventricular systolic dysfunction. LVEDP 11 mm Hg. ? The left ventricular ejection fraction is 45-50% by visual estimate. Anterolateral hypokinesis. ? There is no aortic valve stenosis. ? Prox LAD lesion is 25% stenosed. ? Patent LAD stents with mild distal restenosis. ? Mid LAD lesion is 40% stenosed. ? Ost 1st Diag lesion is 95% stenosed. Branch of diagonal occluded with left to left collaterals. ? Ost 1st Mrg lesion is 50% stenosed. ? Ost Cx to Prox Cx lesion is 75% stenosed. ? A drug-eluting stent was successfully placed using a STENT SYNERGY DES 3X16. ? Post intervention, there is a 0% residual stenosis.  Recommend dual antiplatelet therapy with Aspirin '81mg'$  daily and Clopidogrel '75mg'$  dailylong-term (beyond 12 months) because of long area of Cypher stents in the LAD. Marland Kitchen  Could consider clopidogrel monotherapy after 1 year if there are bleeding issues.   Continue aggressive secondary  prevention.    )        Anesthesia Quick Evaluation

## 2021-11-22 NOTE — Progress Notes (Signed)
Anesthesia Chart Review:  Follows with cardiology for history of CVA s/p MI 2004 with total occlusion of the proximal LAD.   He is status post stenting of his proximal LAD with a 3.5 x 20 mm Cypher stent postdilated to 4.1 mm.,  2009 progressive disease in the mid LAD with additional 2.75 x 18 mm Cypher stent implantation postdilated to 3.35-3.25 taper; the patient has a 2.75 x 13 mm stent in the PLA branch of his RCA and is on life long dual antiplatelet therapy with aspirin and Plavix.   Most recent cardiac catheterization November 2019 revealed 95% first diagonal stenosis and a branch of the diagonal was occluded with left to left collaterals; 75% ostial to proximal circumflex stenosis with successful Synergy drug-eluting stent 3.0 x 16 mm stent to circumflex vessel.  Echocardiogram during that time revealed an EF of 45 to 86%, grade 2 diastolic dysfunction, aortic root dimension 38 mm with moderate left atrial dilatation.  Cardiac clearance per telephone encounter 11/14/2021, "Chart reviewed as part of pre-operative protocol coverage. Given past medical history and time since last visit, based on ACC/AHA guidelines, Brandon Frederick would be at acceptable risk for the planned procedure without further cardiovascular testing. I spoke with the patient and he is not having any concerning cardiac symptoms. He is able to achieve > 4 METS on a consistent basis. I will route this recommendation to the requesting party via Epic fax function and remove from pre-op pool. Per Dr. Claiborne Billings, he may hold Plavix for 5 days and hold aspirin for 7 days prior to procedure. These medications should be resumed as soon as hemodynamically stable following the procedure."  Patient reports last dose Plavix 11/26/2021.  Preop labs reviewed, creatinine mildly elevated 1.33, otherwise unremarkable.  EKG 07/10/2021: Sinus bradycardia with first-degree AV block.  Rate 59.  Low voltage QRS.  Cannot rule out anteroseptal infarct, age  undetermined.  T wave abnormality, consider lateral ischemia.  No significant changes from prior.  TTE 12/01/2017: - Left ventricle: The cavity size was mildly dilated. There was    moderate focal basal hypertrophy. Systolic function was mildly    reduced. The estimated ejection fraction was in the range of 45%    to 50%. There is akinesis of the apicalinferior and apical    myocardium. Features are consistent with a pseudonormal left    ventricular filling pattern, with concomitant abnormal relaxation    and increased filling pressure (grade 2 diastolic dysfunction).  - Aortic valve: Trileaflet; normal thickness, mildly calcified    leaflets.  - Aorta: Aortic root dimension: 38 mm (ED).  - Aortic root: The aortic root was mildly dilated.  - Left atrium: The atrium was mildly dilated.   Cath and PCI 11/30/2017: There is mild left ventricular systolic dysfunction. LVEDP 11 mm Hg. The left ventricular ejection fraction is 45-50% by visual estimate. Anterolateral hypokinesis. There is no aortic valve stenosis. Prox LAD lesion is 25% stenosed. Patent LAD stents with mild distal restenosis. Mid LAD lesion is 40% stenosed. Ost 1st Diag lesion is 95% stenosed. Branch of diagonal occluded with left to left collaterals. Ost 1st Mrg lesion is 50% stenosed. Ost Cx to Prox Cx lesion is 75% stenosed. A drug-eluting stent was successfully placed using a STENT SYNERGY DES 3X16. Post intervention, there is a 0% residual stenosis.  Recommend dual antiplatelet therapy with Aspirin '81mg'$  daily and Clopidogrel '75mg'$  daily long-term (beyond 12 months) because of long area of Cypher stents in the LAD. Marland Kitchen  Could consider clopidogrel monotherapy after 1 year if there are bleeding issues.    Continue aggressive secondary prevention.    Wynonia Musty [facility] Short Stay Center/Anesthesiology Phone [phone] 11/22/2021 11:14 AM

## 2021-12-02 ENCOUNTER — Ambulatory Visit (HOSPITAL_BASED_OUTPATIENT_CLINIC_OR_DEPARTMENT_OTHER): Payer: Medicare Other | Admitting: Anesthesiology

## 2021-12-02 ENCOUNTER — Observation Stay (HOSPITAL_COMMUNITY)
Admission: RE | Admit: 2021-12-02 | Discharge: 2021-12-03 | Disposition: A | Discharge status: Home / Self Care | Payer: Medicare Other | Attending: Orthopedic Surgery | Admitting: Orthopedic Surgery

## 2021-12-02 ENCOUNTER — Ambulatory Visit (HOSPITAL_COMMUNITY): Payer: Medicare Other

## 2021-12-02 ENCOUNTER — Other Ambulatory Visit: Payer: Self-pay

## 2021-12-02 ENCOUNTER — Encounter (HOSPITAL_COMMUNITY)
Admission: RE | Disposition: A | Discharge status: Home / Self Care | Payer: Self-pay | Source: Home / Self Care | Attending: Orthopedic Surgery

## 2021-12-02 ENCOUNTER — Encounter (HOSPITAL_COMMUNITY): Payer: Self-pay | Admitting: Orthopedic Surgery

## 2021-12-02 ENCOUNTER — Ambulatory Visit (HOSPITAL_COMMUNITY): Payer: Medicare Other | Admitting: Physician Assistant

## 2021-12-02 DIAGNOSIS — M48062 Spinal stenosis, lumbar region with neurogenic claudication: Secondary | ICD-10-CM

## 2021-12-02 DIAGNOSIS — I2510 Atherosclerotic heart disease of native coronary artery without angina pectoris: Secondary | ICD-10-CM

## 2021-12-02 DIAGNOSIS — I10 Essential (primary) hypertension: Secondary | ICD-10-CM | POA: Diagnosis not present

## 2021-12-02 DIAGNOSIS — M48061 Spinal stenosis, lumbar region without neurogenic claudication: Secondary | ICD-10-CM

## 2021-12-02 DIAGNOSIS — Z79899 Other long term (current) drug therapy: Secondary | ICD-10-CM | POA: Insufficient documentation

## 2021-12-02 DIAGNOSIS — M1990 Unspecified osteoarthritis, unspecified site: Secondary | ICD-10-CM | POA: Diagnosis not present

## 2021-12-02 DIAGNOSIS — M4800 Spinal stenosis, site unspecified: Secondary | ICD-10-CM | POA: Diagnosis present

## 2021-12-02 HISTORY — PX: DECOMPRESSIVE LUMBAR LAMINECTOMY LEVEL 2: SHX5792

## 2021-12-02 SURGERY — DECOMPRESSIVE LUMBAR LAMINECTOMY LEVEL 2
Anesthesia: General | Site: Back

## 2021-12-02 MED ORDER — DOCUSATE SODIUM 100 MG PO CAPS
100.0000 mg | ORAL_CAPSULE | Freq: Two times a day (BID) | ORAL | Status: DC
  Administered 2021-12-02 (×2): 100 mg via ORAL
  Filled 2021-12-02 (×2): qty 1

## 2021-12-02 MED ORDER — BUPIVACAINE-EPINEPHRINE (PF) 0.25% -1:200000 IJ SOLN
INTRAMUSCULAR | Status: DC | PRN
  Administered 2021-12-02: 30 mL via PERINEURAL

## 2021-12-02 MED ORDER — DEXAMETHASONE SODIUM PHOSPHATE 10 MG/ML IJ SOLN
INTRAMUSCULAR | Status: AC
  Filled 2021-12-02: qty 1

## 2021-12-02 MED ORDER — FENTANYL CITRATE (PF) 250 MCG/5ML IJ SOLN
INTRAMUSCULAR | Status: AC
  Filled 2021-12-02: qty 5

## 2021-12-02 MED ORDER — POLYETHYLENE GLYCOL 3350 17 G PO PACK
17.0000 g | PACK | Freq: Every day | ORAL | Status: DC
  Administered 2021-12-02: 17 g via ORAL
  Filled 2021-12-02: qty 1

## 2021-12-02 MED ORDER — ONDANSETRON HCL 4 MG PO TABS: 4.0000 mg | ORAL_TABLET | Freq: Four times a day (QID) | ORAL | Status: DC | PRN

## 2021-12-02 MED ORDER — PHENYLEPHRINE 80 MCG/ML (10ML) SYRINGE FOR IV PUSH (FOR BLOOD PRESSURE SUPPORT)
PREFILLED_SYRINGE | INTRAVENOUS | Status: DC | PRN
  Administered 2021-12-02 (×2): 80 ug via INTRAVENOUS
  Administered 2021-12-02: 160 ug via INTRAVENOUS
  Administered 2021-12-02 (×3): 80 ug via INTRAVENOUS

## 2021-12-02 MED ORDER — ROCURONIUM BROMIDE 10 MG/ML (PF) SYRINGE
PREFILLED_SYRINGE | INTRAVENOUS | Status: AC
  Filled 2021-12-02: qty 10

## 2021-12-02 MED ORDER — METOPROLOL SUCCINATE ER 25 MG PO TB24: 25.0000 mg | ORAL_TABLET | Freq: Every day | ORAL | Status: DC

## 2021-12-02 MED ORDER — CEFAZOLIN SODIUM-DEXTROSE 2-4 GM/100ML-% IV SOLN
2.0000 g | Freq: Three times a day (TID) | INTRAVENOUS | Status: AC
  Administered 2021-12-02 (×2): 2 g via INTRAVENOUS
  Filled 2021-12-02 (×2): qty 100

## 2021-12-02 MED ORDER — OXYCODONE HCL 5 MG/5ML PO SOLN: 5.0000 mg | Freq: Once | ORAL | Status: AC | PRN

## 2021-12-02 MED ORDER — ONDANSETRON HCL 4 MG/2ML IJ SOLN: 4.0000 mg | Freq: Four times a day (QID) | INTRAMUSCULAR | Status: DC | PRN

## 2021-12-02 MED ORDER — ROCURONIUM BROMIDE 10 MG/ML (PF) SYRINGE
PREFILLED_SYRINGE | INTRAVENOUS | Status: DC | PRN
  Administered 2021-12-02 (×2): 50 mg via INTRAVENOUS

## 2021-12-02 MED ORDER — PROPOFOL 10 MG/ML IV BOLUS
INTRAVENOUS | Status: DC | PRN
  Administered 2021-12-02: 200 mg via INTRAVENOUS

## 2021-12-02 MED ORDER — LIDOCAINE 2% (20 MG/ML) 5 ML SYRINGE
INTRAMUSCULAR | Status: AC
  Filled 2021-12-02: qty 5

## 2021-12-02 MED ORDER — ORAL CARE MOUTH RINSE: 15.0000 mL | Freq: Once | OROMUCOSAL | Status: AC

## 2021-12-02 MED ORDER — FENTANYL CITRATE (PF) 100 MCG/2ML IJ SOLN
INTRAMUSCULAR | Status: AC
  Filled 2021-12-02: qty 2

## 2021-12-02 MED ORDER — POVIDONE-IODINE 7.5 % EX SOLN
Freq: Once | CUTANEOUS | Status: AC
  Filled 2021-12-02: qty 118

## 2021-12-02 MED ORDER — VANCOMYCIN HCL 500 MG IV SOLR
INTRAVENOUS | Status: AC
  Filled 2021-12-02: qty 10

## 2021-12-02 MED ORDER — LORATADINE 10 MG PO TABS: 10.0000 mg | ORAL_TABLET | Freq: Every day | ORAL | Status: DC | PRN

## 2021-12-02 MED ORDER — LIDOCAINE 2% (20 MG/ML) 5 ML SYRINGE
INTRAMUSCULAR | Status: DC | PRN
  Administered 2021-12-02: 100 mg via INTRAVENOUS

## 2021-12-02 MED ORDER — BUPIVACAINE-EPINEPHRINE (PF) 0.25% -1:200000 IJ SOLN
INTRAMUSCULAR | Status: AC
  Filled 2021-12-02: qty 30

## 2021-12-02 MED ORDER — OXYCODONE HCL 5 MG PO TABS
5.0000 mg | ORAL_TABLET | Freq: Once | ORAL | Status: AC | PRN
  Administered 2021-12-02: 5 mg via ORAL

## 2021-12-02 MED ORDER — LACTATED RINGERS IV SOLN: INTRAVENOUS | Status: DC

## 2021-12-02 MED ORDER — CHOLECALCIFEROL 10 MCG (400 UNIT) PO TABS
400.0000 [IU] | ORAL_TABLET | Freq: Every morning | ORAL | Status: DC
  Filled 2021-12-02: qty 1

## 2021-12-02 MED ORDER — HYDROMORPHONE HCL 1 MG/ML IJ SOLN
0.5000 mg | INTRAMUSCULAR | Status: DC | PRN
  Administered 2021-12-02: 1 mg via INTRAVENOUS
  Filled 2021-12-02: qty 1

## 2021-12-02 MED ORDER — EPHEDRINE SULFATE-NACL 50-0.9 MG/10ML-% IV SOSY
PREFILLED_SYRINGE | INTRAVENOUS | Status: DC | PRN
  Administered 2021-12-02: 10 mg via INTRAVENOUS
  Administered 2021-12-02: 5 mg via INTRAVENOUS
  Administered 2021-12-02: 10 mg via INTRAVENOUS

## 2021-12-02 MED ORDER — GABAPENTIN 300 MG PO CAPS
300.0000 mg | ORAL_CAPSULE | Freq: Three times a day (TID) | ORAL | Status: DC
  Administered 2021-12-02: 300 mg via ORAL
  Filled 2021-12-02: qty 1

## 2021-12-02 MED ORDER — OXYCODONE HCL 5 MG PO TABS
ORAL_TABLET | ORAL | Status: AC
  Filled 2021-12-02: qty 1

## 2021-12-02 MED ORDER — VANCOMYCIN HCL 500 MG IV SOLR
INTRAVENOUS | Status: DC | PRN
  Administered 2021-12-02: 500 mg via TOPICAL

## 2021-12-02 MED ORDER — RAMIPRIL 1.25 MG PO CAPS
2.5000 mg | ORAL_CAPSULE | Freq: Two times a day (BID) | ORAL | Status: DC
  Filled 2021-12-02: qty 2

## 2021-12-02 MED ORDER — THROMBIN 20000 UNITS EX SOLR: CUTANEOUS | Status: DC | PRN

## 2021-12-02 MED ORDER — DEXAMETHASONE SODIUM PHOSPHATE 10 MG/ML IJ SOLN
10.0000 mg | Freq: Once | INTRAMUSCULAR | Status: AC
  Administered 2021-12-02: 10 mg via INTRAVENOUS
  Filled 2021-12-02: qty 1

## 2021-12-02 MED ORDER — FENTANYL CITRATE (PF) 100 MCG/2ML IJ SOLN
25.0000 ug | INTRAMUSCULAR | Status: DC | PRN
  Administered 2021-12-02 (×3): 50 ug via INTRAVENOUS

## 2021-12-02 MED ORDER — THROMBIN 20000 UNITS EX SOLR
CUTANEOUS | Status: AC
  Filled 2021-12-02: qty 20000

## 2021-12-02 MED ORDER — NITROGLYCERIN 0.4 MG SL SUBL: 0.4000 mg | SUBLINGUAL_TABLET | SUBLINGUAL | Status: DC | PRN

## 2021-12-02 MED ORDER — FINASTERIDE 5 MG PO TABS: 5.0000 mg | ORAL_TABLET | Freq: Every day | ORAL | Status: DC

## 2021-12-02 MED ORDER — TRANEXAMIC ACID-NACL 1000-0.7 MG/100ML-% IV SOLN
1000.0000 mg | INTRAVENOUS | Status: AC
  Administered 2021-12-02: 1000 mg via INTRAVENOUS
  Filled 2021-12-02: qty 100

## 2021-12-02 MED ORDER — OXYCODONE HCL 5 MG PO TABS
5.0000 mg | ORAL_TABLET | ORAL | Status: DC | PRN
  Administered 2021-12-02 – 2021-12-03 (×4): 10 mg via ORAL
  Filled 2021-12-02 (×4): qty 2

## 2021-12-02 MED ORDER — ACETAMINOPHEN 500 MG PO TABS
1000.0000 mg | ORAL_TABLET | Freq: Once | ORAL | Status: AC
  Administered 2021-12-02: 1000 mg via ORAL
  Filled 2021-12-02: qty 2

## 2021-12-02 MED ORDER — ONDANSETRON HCL 4 MG/2ML IJ SOLN
INTRAMUSCULAR | Status: DC | PRN
  Administered 2021-12-02: 4 mg via INTRAVENOUS

## 2021-12-02 MED ORDER — CHLORHEXIDINE GLUCONATE 0.12 % MT SOLN
15.0000 mL | Freq: Once | OROMUCOSAL | Status: AC
  Administered 2021-12-02: 15 mL via OROMUCOSAL
  Filled 2021-12-02: qty 15

## 2021-12-02 MED ORDER — ATORVASTATIN CALCIUM 80 MG PO TABS: 80.0000 mg | ORAL_TABLET | Freq: Every evening | ORAL | Status: DC

## 2021-12-02 MED ORDER — METHOCARBAMOL 750 MG PO TABS
750.0000 mg | ORAL_TABLET | Freq: Four times a day (QID) | ORAL | Status: DC
  Administered 2021-12-02 – 2021-12-03 (×3): 750 mg via ORAL
  Filled 2021-12-02 (×3): qty 1

## 2021-12-02 MED ORDER — MIDAZOLAM HCL 2 MG/2ML IJ SOLN
INTRAMUSCULAR | Status: AC
  Filled 2021-12-02: qty 2

## 2021-12-02 MED ORDER — PROPOFOL 10 MG/ML IV BOLUS
INTRAVENOUS | Status: AC
  Filled 2021-12-02: qty 20

## 2021-12-02 MED ORDER — CEFAZOLIN SODIUM-DEXTROSE 2-4 GM/100ML-% IV SOLN
2.0000 g | INTRAVENOUS | Status: AC
  Administered 2021-12-02: 2 g via INTRAVENOUS
  Filled 2021-12-02: qty 100

## 2021-12-02 MED ORDER — ONDANSETRON HCL 4 MG/2ML IJ SOLN
INTRAMUSCULAR | Status: AC
  Filled 2021-12-02: qty 2

## 2021-12-02 MED ORDER — PROMETHAZINE HCL 25 MG/ML IJ SOLN: 6.2500 mg | INTRAMUSCULAR | Status: DC | PRN

## 2021-12-02 MED ORDER — 0.9 % SODIUM CHLORIDE (POUR BTL) OPTIME
TOPICAL | Status: DC | PRN
  Administered 2021-12-02: 1000 mL

## 2021-12-02 MED ORDER — FENTANYL CITRATE (PF) 250 MCG/5ML IJ SOLN
INTRAMUSCULAR | Status: DC | PRN
  Administered 2021-12-02 (×2): 50 ug via INTRAVENOUS
  Administered 2021-12-02: 100 ug via INTRAVENOUS
  Administered 2021-12-02: 50 ug via INTRAVENOUS

## 2021-12-02 MED ORDER — PHENYLEPHRINE HCL-NACL 20-0.9 MG/250ML-% IV SOLN
INTRAVENOUS | Status: DC | PRN
  Administered 2021-12-02: 25 ug/min via INTRAVENOUS

## 2021-12-02 MED ORDER — BACITRACIN ZINC 500 UNIT/GM EX OINT
TOPICAL_OINTMENT | CUTANEOUS | Status: AC
  Filled 2021-12-02: qty 28.35

## 2021-12-02 MED ORDER — ACETAMINOPHEN 500 MG PO TABS
1000.0000 mg | ORAL_TABLET | Freq: Three times a day (TID) | ORAL | Status: DC
  Administered 2021-12-03: 1000 mg via ORAL
  Filled 2021-12-02 (×2): qty 2

## 2021-12-02 MED ORDER — MIDAZOLAM HCL 2 MG/2ML IJ SOLN
INTRAMUSCULAR | Status: DC | PRN
  Administered 2021-12-02: 2 mg via INTRAVENOUS

## 2021-12-02 MED ORDER — SUGAMMADEX SODIUM 200 MG/2ML IV SOLN
INTRAVENOUS | Status: DC | PRN
  Administered 2021-12-02: 250 mg via INTRAVENOUS

## 2021-12-02 SURGICAL SUPPLY — 47 items
BUR MATCHSTICK NEURO 3.0 HP (BURR) IMPLANT
BUR NEURO DRILL SOFT 3.0X3.8M (BURR) ×2 IMPLANT
CANISTER SUCT 3000ML PPV (MISCELLANEOUS) ×2 IMPLANT
CORD BIPOLAR FORCEPS 12FT (ELECTRODE) ×2 IMPLANT
COVER MAYO STAND STRL (DRAPES) ×2 IMPLANT
COVER SURGICAL LIGHT HANDLE (MISCELLANEOUS) ×2 IMPLANT
DRAPE C-ARM 42X72 X-RAY (DRAPES) ×2 IMPLANT
DRAPE MICROSCOPE LEICA 54X105 (DRAPES) ×2 IMPLANT
DRESSING MEPILEX FLEX 4X4 (GAUZE/BANDAGES/DRESSINGS) ×2 IMPLANT
DRSG MEPILEX FLEX 4X4 (GAUZE/BANDAGES/DRESSINGS) ×1
DRSG MEPILEX POST OP 4X8 (GAUZE/BANDAGES/DRESSINGS) IMPLANT
DRSG TEGADERM 4X10 (GAUZE/BANDAGES/DRESSINGS) ×2 IMPLANT
DRSG TEGADERM 4X4.75 (GAUZE/BANDAGES/DRESSINGS) IMPLANT
DURAPREP 26ML APPLICATOR (WOUND CARE) ×2 IMPLANT
ELECT BLADE 4.0 EZ CLEAN MEGAD (MISCELLANEOUS) ×1
ELECT PENCIL ROCKER SW 15FT (MISCELLANEOUS) ×2 IMPLANT
ELECT REM PT RETURN 9FT ADLT (ELECTROSURGICAL) ×1
ELECTRODE BLDE 4.0 EZ CLN MEGD (MISCELLANEOUS) ×2 IMPLANT
ELECTRODE REM PT RTRN 9FT ADLT (ELECTROSURGICAL) ×2 IMPLANT
GAUZE SPONGE 2X2 8PLY STRL LF (GAUZE/BANDAGES/DRESSINGS) IMPLANT
GLOVE BIO SURGEON STRL SZ7.5 (GLOVE) ×2 IMPLANT
GLOVE INDICATOR 7.5 STRL GRN (GLOVE) ×2 IMPLANT
GOWN STRL REUS W/ TWL LRG LVL3 (GOWN DISPOSABLE) ×2 IMPLANT
GOWN STRL REUS W/TWL LRG LVL3 (GOWN DISPOSABLE) ×1
GOWN STRL SURGICAL XL XLNG (GOWN DISPOSABLE) ×2 IMPLANT
KIT BASIN OR (CUSTOM PROCEDURE TRAY) ×2 IMPLANT
KIT POSITION SURG JACKSON T1 (MISCELLANEOUS) ×2 IMPLANT
KIT TURNOVER KIT B (KITS) ×2 IMPLANT
NDL 22X1.5 STRL (OR ONLY) (MISCELLANEOUS) ×2 IMPLANT
NEEDLE 22X1.5 STRL (OR ONLY) (MISCELLANEOUS) ×1 IMPLANT
NS IRRIG 1000ML POUR BTL (IV SOLUTION) ×2 IMPLANT
PACK LAMINECTOMY ORTHO (CUSTOM PROCEDURE TRAY) ×2 IMPLANT
PATTIES SURGICAL .5 X.5 (GAUZE/BANDAGES/DRESSINGS) ×2 IMPLANT
SPONGE SURGIFOAM ABS GEL 100 (HEMOSTASIS) ×2 IMPLANT
SPONGE T-LAP 4X18 ~~LOC~~+RFID (SPONGE) ×2 IMPLANT
SUT BONE WAX W31G (SUTURE) ×2 IMPLANT
SUT ETHILON 3 0 PS 1 (SUTURE) IMPLANT
SUT MNCRL+ AB 3-0 CT1 36 (SUTURE) ×2 IMPLANT
SUT MONOCRYL AB 3-0 CT1 36IN (SUTURE) ×1
SUT VIC AB 0 CT1 18XCR BRD8 (SUTURE) ×2 IMPLANT
SUT VIC AB 0 CT1 8-18 (SUTURE) ×1
SUT VIC AB 2-0 CT1 18 (SUTURE) ×2 IMPLANT
SYR BULB IRRIG 60ML STRL (SYRINGE) ×2 IMPLANT
SYR CONTROL 10ML LL (SYRINGE) ×2 IMPLANT
TOWEL GREEN STERILE (TOWEL DISPOSABLE) ×2 IMPLANT
TOWEL GREEN STERILE FF (TOWEL DISPOSABLE) ×2 IMPLANT
WATER STERILE IRR 1000ML POUR (IV SOLUTION) ×2 IMPLANT

## 2021-12-02 NOTE — Progress Notes (Signed)
Orthopedic Surgery Post-operative Progress Note  Assessment: Patient is a 66 y.o. male who is currently admitted after undergoing L3-5 laminectomy   Plan: -Operative plans complete -Drains to be maintained until output slows -Out of bed as tolerated, no brace -No bending/lifting/twisting greater than 10 pounds -PT evaluate and treat -Pain control -Regular diet -No chemoprophylaxis for dvt or antiplatelets for 72 hours after surgery -Ancef x2 post-operative doses -Disposition: admit overnight  ___________________________________________________________________________   Subjective: No acute events since surgery. Recovering in PACU. Having back pain. No leg pain. No nausea or emesis.   Objective:  General: no acute distress, laying in bed Neurologic: alert, answering questions appropriately, following commands Respiratory: unlabored breathing on room air Skin: dressing clear/dry/intact, drains with sanguinous output  MSK (spine):  -Strength exam      Right  Left  EHL    5/5  5/5 TA    5/5  5/5 GSC    5/5  5/5 Knee extension  5/5  5/5 Hip flexion   5/5  5/5  -Sensory exam    Sensation intact to light touch in L3-S1 nerve distributions of bilateral lower extremities   Patient name: Brandon Frederick Patient MRN: [id number] Date: 12/02/21

## 2021-12-02 NOTE — Discharge Instructions (Signed)
Orthopedic Surgery Discharge Instructions  Patient name: Brandon Frederick Procedure Performed: L3-5 laminectomies Date of Surgery: 12/02/2021 Surgeon: Ileene Rubens, MD  Pre-operative Diagnosis: Lumbar stenosis with neurogenic claudication Post-operative Diagnosis: Same as above  Discharge Date: 12/03/2021 Discharged to: home Discharge Condition: stable  Activity: You should refrain from bending, lifting, or twisting with objects greater than ten pounds until three months after surgery. You are encouraged to walk as much as desired. You can perform household activities such as cleaning dishes, doing laundry, vacuuming, etc. as long as the ten-pound restriction is followed. You do not need to wear a brace during the post-operative period.   Incision Care: Your incision site has a dressing over it. That dressing should remain in place and dry at all times for a total of one week after surgery. After one week, you can remove the dressing. Underneath the dressing, you will find pieces of tape. You should leave these pieces of tape in place. They will fall off with time. Do not pick, rub, or scrub at them. Do not put cream or lotion over the surgical area. After one week and once the dressing is off, it is okay to let soap and water run over your incision. Again, do not pick, scrub, or rub at the pieces of tape when bathing. Do not submerge (e.g., take a bath, swim, go in a hot tub, etc.) until six weeks after surgery. There may be some bloody drainage from the incision into the dressing after surgery. This is normal. You do not need to replace the dressing. Continue to leave it in place for the one week as instructed above. Should the dressing become saturated with blood or drainage, please call the office for further instructions.   Drains: The drains should be left in place until 11/30. Before pulling it, you may need to empty the canister. To do this, you remove the cap and dump the drainage into a  receptacle. You then apply pressure to load the springs and then recap the canister to get the suction effect of the drain. Do this any time the canister gets near full. When removing the drains, remove the sticky and dressing around the drain. Do not remove the big dressing in the middle of the back. Then, cut one strand of suture near the skin surface. Take the cap off the drain to release the suction. Slowly but steadily remove that drain. Dry blood that comes out of the drain hole with gauze. Apply pressure to help slow the blood. Place a gauze over the area and then the clear adhesive provided to you at the hospital. Repeat this process for the other drain. If you are uncomfortable or would prefer that I do the drain removal, come to the office anytime on Thursday afternoon (11/30) or Friday (12/01) morning and I can do it. I am not in surgery and will be in the office at those times.   Medications: You have been prescribed oxycodone. This is a narcotic pain medication and should only be taken as prescribed. You should not drink alcohol or operate heavy machinery (including driving) while taking this medication. The oxycodone can cause constipation as a side effect. For that reason, you have been prescribed senna. This is a laxative. You do not need to take this medication if you develop diarrhea. Should you remain constipated even while taking the senna, please use over-the-counter miralax as instructed on the packaging to promote regular bowel movements. Tylenol has been prescribed to be taken  every 8 hours, which will give you additional pain relief. Robaxin is a muscle relaxer that has been prescribed to you for muscle spasm type pain. Take this medication as needed. Gabapentin is a nerve medication that can provide additional pain relief. Take this three times per day for the first two weeks after surgery.   You can use over-the-counter NSAIDs (ibuprofen, Aleve, Celebrex, naproxen, meloxicam, etc.) for  additional pain relief after this surgery. These medications are safe to take with the Tylenol you have been prescribed. You should not take these medications if you have or have had kidney problems or gastrointestinal ulcers or have otherwise been instructed not to take these. Take these medications as instructed on the packaging.   In order to set expectations for opioid prescriptions, you will only be prescribed opioids for a total of six weeks after surgery and, at two-weeks after surgery, your opioid prescription will start to tapered (decreased dosage and number of pills). If you have ongoing need for opioid medication six weeks after surgery, you will be referred to pain management. If you are already established with a provider that is giving you opioid medications, you should schedule an appointment with them for six weeks after surgery if you feel you are going to need another prescription. State law only allows for opioid prescriptions one week at a time. If you are running out of opioid medication near the end of the week, please call the office during business hours before running out so I can send you another prescription.   You may resume any home blood thinners (warfarin, lovenox, apixaban, plavix, xarelto, etc) 72 hours after your surgery. Take these medications as they were previously prescribed.  Driving: You should not drive while taking narcotic pain medications. You should start getting back to driving slowly and you may want to try driving in a parking lot before doing anything more.   Diet: You are safe to resume your regular diet after surgery.   Reasons to Call the Office After Surgery: You should feel free to call the office with any concerns or questions you have in the post-operative period, but you should definitely notify the office if you develop: -shortness of breath, chest pain, or trouble breathing -excessive bleeding, drainage, redness, or swelling around the surgical  site -fevers, chills, or pain that is getting worse with each passing day -persistent nausea or vomiting -new weakness in any extremity, new or worsening numbness or tingling in any extremity -numbness in the groin, bowel or bladder incontinence -other concerns about your surgery  Follow Up Appointments: You should have an office appointment scheduled for approximately two weeks after surgery. If you do not remember when this appointment is or do not already have it scheduled, please call the office to schedule.   Office Information:  -Phone number: [phone] -Address: [address]       [place], [place] [address]

## 2021-12-02 NOTE — Transfer of Care (Signed)
Immediate Anesthesia Transfer of Care Note  Patient: Brandon Frederick  Procedure(s) Performed: L3-4 AND L4-5 LUMBAR LAMINECTOMY LEVEL 2 (Back)  Patient Location: PACU  Anesthesia Type:General  Level of Consciousness: awake, alert , and oriented  Airway & Oxygen Therapy: Patient Spontanous Breathing and Patient connected to face mask oxygen  Post-op Assessment: Report given to RN, Post -op Vital signs reviewed and stable, and Patient moving all extremities X 4  Post vital signs: Reviewed and stable  Last Vitals:  Vitals Value Taken Time  BP 129/87 12/02/21 1200  Temp    Pulse 74 12/02/21 1201  Resp 8 12/02/21 1201  SpO2 95 % 12/02/21 1201  Vitals shown include unvalidated device data.  Last Pain:  Vitals:   12/02/21 0629  PainSc: 2       Patients Stated Pain Goal: 0 (09/31/12 1624)  Complications: No notable events documented.

## 2021-12-02 NOTE — Anesthesia Postprocedure Evaluation (Signed)
Anesthesia Post Note  Patient: Brandon Frederick  Procedure(s) Performed: L3-4 AND L4-5 LUMBAR LAMINECTOMY LEVEL 2 (Back)     Patient location during evaluation: PACU Anesthesia Type: General Level of consciousness: awake Pain management: pain level controlled Vital Signs Assessment: post-procedure vital signs reviewed and stable Respiratory status: spontaneous breathing, nonlabored ventilation and respiratory function stable Cardiovascular status: blood pressure returned to baseline and stable Postop Assessment: no apparent nausea or vomiting Anesthetic complications: no   No notable events documented.  Last Vitals:  Vitals:   12/02/21 1245 12/02/21 1307  BP: 113/82 114/76  Pulse: 78 72  Resp: 16 18  Temp: 36.5 C 36.5 C  SpO2: 93% 97%    Last Pain:  Vitals:   12/02/21 1324  TempSrc:   PainSc: 10-Worst pain ever                 Nilda Simmer

## 2021-12-02 NOTE — Op Note (Signed)
Orthopedic Spine Surgery Operative Report  Procedure: L3-5 lumbar laminectomies  Modifier: none  Date of procedure: 11/272023  Patient name: JANCARLO BIERMANN MRN: [id number] DOB: [birth year]  Surgeon: Ileene Rubens, MD Assistant: None Pre-operative diagnosis: lumbar stenosis with neurogenic claudication Post-operative diagnosis: same as above Findings: L3/4 and L4/5 hypertrophic facets and thickened ligamentum flavum  Specimens: none Anesthesia: general EBL: [id number] Complications: none Pre-incision antibiotic: ancef  Implants: none   Indication for procedure: Patient is a 66 y.o. male who presented to the office with symptoms consistent with lumbar stenosis and neurogenic claudication. The patient had tried conservative treatments that did not provide any lasting relief. As result, operative management was discussed. The pre-operative MRI showed stenosis from L3-5 so a L3-5 laminectomy was presented as a treatment option. The risks including but not limited to iatrogenic instability, dural tear, nerve root injury, paralysis, persistent pain, infection, bleeding, heart attack, death, stroke, fracture, and need for additional procedures were discussed with the patient. The benefit of the surgery would be relief of the patient's symptoms related to the lumbar stenosis. The alternatives to surgical management were covered with the patient and included continued monitoring, physical therapy, over-the-counter pain medications, ambulatory aids, and activity modification. All the patient's questions were answered to their satisfaction. After this discussion, the patient expressed understanding and elected to proceed with surgical intervention.   Procedure Description: The patient was met in the pre-operative holding area. The patient's identity and consent were verified. The operative site was marked. The patient's remaining questions about the surgery were answered. The patient was brought back to  the operating room. General anesthesia was induced and an endotracheal tube was placed by the anesthesia staff. The patient was transferred to the prone [place] table in the prone position. All bony prominences were well padded. The head of the bed was slightly elevated and the eyes were free from compression by the face pillow. An electric razor was used to remove his hair over the lumbar region. The surgical area was cleansed with alcohol. Fluoroscopy was then brought in to check rotation on the AP image and to mark the levels on the lateral image. The patient's skin was then prepped with DuraPrep and draped in a standard, sterile fashion. A time out was performed that identified the patient, the procedure, and the operative levels. All team members agreed with what was stated in the time out.   A midline incision over the spinous processes of the previously marked levels was made and sharp dissection was continued down through the skin and dermis. Electrocautery was then used to continue the midline dissection down to the level of the spinous process. Subperiosteal dissection was performed using electrocautery to expose the lamina out lateral to the facet joint capsule. Care was taken to not violate the facet joint capsules. A lateral fluoroscopic image was taken to confirm the level. Subperiosteal dissection with electrocautery was then done to expose all the lamina and pars of L3, L4, and L5. Again, care was taken to avoid disruption of the facet capsules.    A rongeur was used to remove the spinous processes and interspinous ligaments from the L3 spinous process to the cranial portion of L5 spinous process. Bone wax was used to obtain hemostasis at the bleeding bony surfaces. A high-speed burr was used to thin the lamina at all planned laminectomy levels to the level of the ligamentum flavum. Above the level of the ligamentum, the lamina was thinned with the burr to the approximate level of  the ligamentum.  Care was taken to leave at least 72m of pars interarticularis on each side. A series of Kerrison rongeurs were used to remove the thinned lamina overlying the ligamentum. A penfield 4 was then used to find the plane between the ligamentum and the dura. A woodsen was placed into this plane and was used to protect the thecal sac as a Kerrison was used to remove the ligamentum and hypertrophic superior facets overlying the dura.   A woodsen was placed into the laminectomy site to palpate for any remaining areas of stenosis. Once it was confirmed with the woodsen that decompression had been completed from the medial pedicle wall to the contralateral medial pedicle wall and from the pedicle of the most caudal planned level to the pedicle of the most cranial planned level, decompression was determined to be completed. A lateral fluoroscopic film was taken with a penfield in the cranial aspect of the laminectomy and one in the caudal aspect to demonstrate the levels decompressed.   The wound was copiously irrigated with sterile saline. 5057mof vancomycin powder was placed into the wound. A medium hemovac drain was placed deep to the fascia. The fascia was reapproximated with 0 vicryl suture. A subcutaneous hemovac drain was placed above the fascia. The subcutaneous fat was reapproximated with 0 vicryl suture. The deep dermal layer was reapproximated with 2-0 viryl. The skin as closed with a 3-0 running moncryl. All counts were correct at the end of the case. The incision was dressed with steri strips and benzoine. An island dressing was placed over the wound. The patient was transferred back to a bed and brought to the post-anesthesia care unit by anesthesia staff in stable condition.  Post-operative plan: The patient will recover in the post-anesthesia care unit and then go to the floor. The patient will receive two post-operative doses of ancef. The patient will be out of bed as tolerated with no brace. The  patient will work with physical therapy. The drains will be removed when output slows and this may involve the patient's wife removing them. I anticipate that the patient will discharge to home tomorrow afternoon.    MiIleene Rubensrthopedic Surgeon

## 2021-12-02 NOTE — H&P (Signed)
Orthopedic Spine Surgery H&P Note  Assessment: Patient is a 66 y.o. male with L3/4 and L4/5 stenosis and neurogenic claudication   Plan: -Out of bed as tolerated, activity as tolerated, no brace -Written consent verified -Site marked -Hold anticoagulation in anticipation of surgery -Ancef on all to OR -NPO for procedure -To OR when ready  The patient has symptoms consistent with lumbar stenosis and neurogenic claudication. The patient's symptoms were not getting improvement with conservative treatment so operative management was discussed in the form of L3/4 and L4/5 laminectomies. The risks including but not limited to iatrogenic instability, dural tear, nerve root injury, paralysis, persistent pain, infection, bleeding, heart attack, death, stroke, fracture, and need for additional procedures were discussed with the patient. The benefit of the surgery would be improvement in the patient's symptoms related to the lumbar stenosis which would be the leg pain. I explained that back pain relief is not the goal of the surgery and it is not reliably alleviated with this surgery. All the patient's questions were answered to their satisfaction. After this discussion, the patient expressed understanding and elected to proceed with surgical intervention.     ___________________________________________________________________________  Chief Complaint: low back pain that radiates into bilateral legs  History: Patient is 66 y.o. male who has been previously seen in the office for low back pain that radiates into bilateral legs. Work up was consistent with neurogenic claudication. These symptoms failed to improve with conservative treatment so operative management was discussed at the last office visit. The patient presents today with no changes in their symptoms since the last office visit. See previous office note for further details.    Review of systems: General: denies fevers and chills,  myalgias Neurologic: denies recent changes in vision, slurred speech Abdomen: denies nausea, vomiting, hematemesis Respiratory: denies cough, shortness of breath  Past medical history:  Melanoma Myocardial infarction Coronary artery disease   Allergies: dilaudid, hydrocodone   Past surgical history:  Cardiac stent placement Right foot hammertoe surgery   Social history:  Denies use of nicotine-containing products (cigarettes, vaping, smokeless, etc.) Alcohol use: denies Denies recreational drug use  Family history: -reviewed and not pertinent   Physical Exam:  General: no acute distress, appears stated age Neurologic: alert, answering questions appropriately, following commands Cardiovascular: regular rate, no cyanosis Respiratory: unlabored breathing on room air, symmetric chest rise Psychiatric: appropriate affect, normal cadence to speech   MSK (spine):  -Strength exam      Left  Right  EHL    5/5  5/5 TA    5/5  5/5 GSC    5/5  5/5 Knee extension  5/5  5/5 Knee flexion   5/5  5/5 Hip flexion   5/5  5/5  -Sensory exam    Sensation intact to light touch in L3-S1 nerve distributions of bilateral lower extremities   Patient name: Brandon Frederick Patient MRN: [id number] Date: 12/02/21

## 2021-12-02 NOTE — Anesthesia Procedure Notes (Signed)
Procedure Name: Intubation Date/Time: 12/02/2021 7:36 AM  Performed by: Niel Hummer, CRNAPre-anesthesia Checklist: Patient identified, Emergency Drugs available, Suction available and Patient being monitored Patient Re-evaluated:Patient Re-evaluated prior to induction Oxygen Delivery Method: Circle system utilized Preoxygenation: Pre-oxygenation with 100% oxygen Induction Type: IV induction Ventilation: Mask ventilation without difficulty Laryngoscope Size: Mac and 4 Grade View: Grade II Tube type: Oral Tube size: 7.5 mm Number of attempts: 1 Airway Equipment and Method: Stylet Placement Confirmation: ETT inserted through vocal cords under direct vision, positive ETCO2 and breath sounds checked- equal and bilateral Secured at: 24 cm Tube secured with: Tape Dental Injury: Teeth and Oropharynx as per pre-operative assessment  Comments: Grade 2b view.

## 2021-12-02 NOTE — Brief Op Note (Signed)
12/02/2021  11:58 AM  PATIENT:  Brandon Frederick  66 y.o. male  PRE-OPERATIVE DIAGNOSIS:  LUMBAR STENOSIS WITH NEUROGENIC CLAUDICATION  POST-OPERATIVE DIAGNOSIS:  LUMBAR STENOSIS WITH NEUROGENIC CLAUDICATION  PROCEDURE:  Procedure(s): L3-4 AND L4-5 LUMBAR LAMINECTOMY  SURGEON:  Surgeon(s) and Role:    * Callie Fielding, MD - Primary  PHYSICIAN ASSISTANT:   ASSISTANTS: none   ANESTHESIA:   general  EBL:  500 mL   BLOOD ADMINISTERED:none  DRAINS:  2 hemovac drains out the back    LOCAL MEDICATIONS USED:  MARCAINE WITH EPINEPHRINE (30cc)  SPECIMEN:  No Specimen  DISPOSITION OF SPECIMEN:  N/A  COUNTS:  YES  TOURNIQUET:  No tourniquet used  DICTATION: .Note written in EPIC  PLAN OF CARE: Admit for overnight observation  PATIENT DISPOSITION:  PACU - hemodynamically stable.   Delay start of Pharmacological VTE agent (>24hrs) due to surgical blood loss or risk of bleeding: yes

## 2021-12-02 NOTE — Progress Notes (Signed)
Orthopedic Surgery Post-operative Progress Note  Assessment: Patient is a 66 y.o. male who is currently admitted after undergoing L3-5 laminectomy   Plan: -Operative plans complete -Drains to be maintained until output slows -Out of bed as tolerated, no brace -No bending/lifting/twisting greater than 10 pounds -PT evaluate and treat -Pain control -Regular diet -No chemoprophylaxis for dvt or antiplatelets for 72 hours after surgery -Ancef x2 post-operative doses -Disposition: admit overnight  ___________________________________________________________________________   Subjective: No acute events since surgery. Has moved to the floor from PACU. Having back pain but it is now tolerable with current medications. Not having leg pain. Denies paresthesias and numbness.   Objective:  General: no acute distress, laying in bed Neurologic: alert, answering questions appropriately, following commands Respiratory: unlabored breathing on room air Skin: dressing clear/dry/intact, drains with sanguinous output  MSK (spine):  -Strength exam      Right  Left  EHL    5/5  5/5 TA    5/5  5/5 GSC    5/5  5/5 Knee extension  5/5  5/5 Hip flexion   5/5  5/5  -Sensory exam    Sensation intact to light touch in L3-S1 nerve distributions of bilateral lower extremities   Patient name: Brandon Frederick Patient MRN: [id number] Date: 12/02/21

## 2021-12-03 ENCOUNTER — Encounter (HOSPITAL_COMMUNITY): Payer: Self-pay | Admitting: Orthopedic Surgery

## 2021-12-03 ENCOUNTER — Encounter: Payer: Self-pay | Admitting: Orthopedic Surgery

## 2021-12-03 DIAGNOSIS — M48062 Spinal stenosis, lumbar region with neurogenic claudication: Secondary | ICD-10-CM | POA: Diagnosis not present

## 2021-12-03 LAB — CBC
HCT: 40.5 % (ref 39.0–52.0)
Hemoglobin: 13.8 g/dL (ref 13.0–17.0)
MCH: 32.4 pg (ref 26.0–34.0)
MCHC: 34.1 g/dL (ref 30.0–36.0)
MCV: 95.1 fL (ref 80.0–100.0)
Platelets: 177 10*3/uL (ref 150–400)
RBC: 4.26 MIL/uL (ref 4.22–5.81)
RDW: 13.7 % (ref 11.5–15.5)
WBC: 13.9 10*3/uL — ABNORMAL HIGH (ref 4.0–10.5)
nRBC: 0 % (ref 0.0–0.2)

## 2021-12-03 LAB — BASIC METABOLIC PANEL
Anion gap: 7 (ref 5–15)
BUN: 16 mg/dL (ref 8–23)
CO2: 24 mmol/L (ref 22–32)
Calcium: 8.7 mg/dL — ABNORMAL LOW (ref 8.9–10.3)
Chloride: 102 mmol/L (ref 98–111)
Creatinine, Ser: 1.08 mg/dL (ref 0.61–1.24)
GFR, Estimated: >60 mL/min (ref >60)
Glucose, Bld: 127 mg/dL — ABNORMAL HIGH (ref 70–99)
Potassium: 4.6 mmol/L (ref 3.5–5.1)
Sodium: 133 mmol/L — ABNORMAL LOW (ref 135–145)

## 2021-12-03 MED ORDER — ACETAMINOPHEN 500 MG PO TABS: 1000.0000 mg | ORAL_TABLET | Freq: Three times a day (TID) | ORAL | 0 refills | Status: AC

## 2021-12-03 MED ORDER — SENNA 8.6 MG PO TABS: 1.0000 | ORAL_TABLET | Freq: Two times a day (BID) | ORAL | 0 refills | Status: AC

## 2021-12-03 MED ORDER — OXYCODONE HCL 5 MG PO TABS: 5.0000 mg | ORAL_TABLET | ORAL | 0 refills | Status: DC | PRN

## 2021-12-03 MED ORDER — GABAPENTIN 300 MG PO CAPS: 300.0000 mg | ORAL_CAPSULE | Freq: Three times a day (TID) | ORAL | 0 refills | Status: DC

## 2021-12-03 MED ORDER — METHOCARBAMOL 750 MG PO TABS: 750.0000 mg | ORAL_TABLET | Freq: Four times a day (QID) | ORAL | 0 refills | Status: DC

## 2021-12-03 NOTE — Evaluation (Addendum)
Physical Therapy Evaluation and Discharge  Patient Details Name: Brandon Frederick MRN: [id number] DOB: 1955-05-17 Today's Date: 12/03/2021  History of Present Illness  Pt is a 66 y/o male who presents s/p L3-L5 laminectomy on 12/02/2021. PMH significant for Arteriosclerotic heart disease (EF 40%), MI, R foot fracture surgery 2019.   Clinical Impression  Patient evaluated by Physical Therapy with no further acute PT needs identified. All education has been completed and the patient has no further questions. Pt was able to demonstrate transfers and ambulation with gross supervision progressing to modified independence by end of session. Pt was educated on precautions,positioning recommendations, appropriate activity progression, and car transfer. See below for any follow-up Physical Therapy or equipment needs. PT is signing off. Thank you for this referral.        Recommendations for follow up therapy are one component of a multi-disciplinary discharge planning process, led by the attending physician.  Recommendations may be updated based on patient status, additional functional criteria and insurance authorization.  Follow Up Recommendations No PT follow up      Assistance Recommended at Discharge PRN  Patient can return home with the following  Assistance with cooking/housework;Assist for transportation    Equipment Recommendations None recommended by PT  Recommendations for Other Services       Functional Status Assessment Patient has had a recent decline in their functional status and demonstrates the ability to make significant improvements in function in a reasonable and predictable amount of time.     Precautions / Restrictions Precautions Precautions: Fall;Back Precaution Booklet Issued: Yes (comment) Precaution Comments: Reviewed handout and pt was cued for precautions during functional mobility. Required Braces or Orthoses:  (No brace order) Restrictions Weight Bearing  Restrictions: No      Mobility  Bed Mobility Overal bed mobility: Needs Assistance Bed Mobility: Rolling, Sidelying to Sit Rolling: Modified independent (Device/Increase time) Sidelying to sit: Supervision       General bed mobility comments: VC's throughout for optimal log roll technique. No assist required.    Transfers Overall transfer level: Needs assistance Equipment used: None Transfers: Sit to/from Stand Sit to Stand: Modified independent (Device/Increase time)           General transfer comment: Pt demonstrated proper hand placement on seated surface for safety. No unsteadiness or LOB noted.    Ambulation/Gait Ambulation/Gait assistance: Supervision, Modified independent (Device/Increase time) Gait Distance (Feet): 500 Feet Assistive device: None Gait Pattern/deviations: Step-through pattern, Decreased stride length, Trunk flexed, Narrow base of support Gait velocity: Decreased Gait velocity interpretation: <1.31 ft/sec, indicative of household ambulator   General Gait Details: VC's for improved posture, closer walker proximity, and forward gaze. No assist required and progressed to a mod I level by end of session.  Stairs            Wheelchair Mobility    Modified Rankin (Stroke Patients Only)       Balance Overall balance assessment: Needs assistance Sitting-balance support: Feet supported, No upper extremity supported Sitting balance-Leahy Scale: Fair     Standing balance support: No upper extremity supported, During functional activity Standing balance-Leahy Scale: Fair                               Pertinent Vitals/Pain Pain Assessment Pain Assessment: Faces Faces Pain Scale: Hurts even more Pain Location: Back/incision site Pain Descriptors / Indicators: Operative site guarding Pain Intervention(s): Limited activity within patient's tolerance, Monitored during session, Repositioned  Home Living Family/patient expects  to be discharged to:: Private residence Living Arrangements: Spouse/significant other;Parent Available Help at Discharge: Family Type of Home: House Home Access: Stairs to enter   Technical brewer of Steps: 1 Alternate Level Stairs-Number of Steps: sunken rooms, a couple steps each direction Home Layout: Two level Home Equipment: None      Prior Function Prior Level of Function : Independent/Modified Independent             Mobility Comments: No AD       Hand Dominance        Extremity/Trunk Assessment   Upper Extremity Assessment Upper Extremity Assessment: Overall WFL for tasks assessed    Lower Extremity Assessment Lower Extremity Assessment: Generalized weakness (Mild; consistent with pre-op diagnosis)    Cervical / Trunk Assessment Cervical / Trunk Assessment: Back Surgery  Communication   Communication: No difficulties  Cognition Arousal/Alertness: Awake/alert Behavior During Therapy: WFL for tasks assessed/performed Overall Cognitive Status: Within Functional Limits for tasks assessed                                          General Comments      Exercises     Assessment/Plan    PT Assessment Patient does not need any further PT services  PT Problem List         PT Treatment Interventions      PT Goals (Current goals can be found in the Care Plan section)  Acute Rehab PT Goals Patient Stated Goal: Home today, ASAP PT Goal Formulation: All assessment and education complete, DC therapy    Frequency       Co-evaluation               AM-PAC PT "6 Clicks" Mobility  Outcome Measure Help needed turning from your back to your side while in a flat bed without using bedrails?: None Help needed moving from lying on your back to sitting on the side of a flat bed without using bedrails?: A Little Help needed moving to and from a bed to a chair (including a wheelchair)?: A Little Help needed standing up from a chair  using your arms (e.g., wheelchair or bedside chair)?: A Little Help needed to walk in hospital room?: A Little Help needed climbing 3-5 steps with a railing? : A Little 6 Click Score: 19    End of Session   Activity Tolerance: Patient tolerated treatment well Patient left: in bed;with call bell/phone within reach;with family/visitor present Nurse Communication: Mobility status PT Visit Diagnosis: Unsteadiness on feet (R26.81);Pain Pain - part of body:  (back)    Time: 3154-0086 PT Time Calculation (min) (ACUTE ONLY): 18 min   Charges:   PT Evaluation $PT Eval Low Complexity: [place], PT, DPT Acute Rehabilitation Services Secure Chat Preferred Office: [phone]   Thelma Comp 12/03/2021, 10:44 AM

## 2021-12-03 NOTE — Plan of Care (Signed)
  Problem: Education: Goal: Ability to verbalize activity precautions or restrictions will improve Outcome: Completed/Met Goal: Knowledge of the prescribed therapeutic regimen will improve Outcome: Completed/Met Goal: Understanding of discharge needs will improve Outcome: Completed/Met  Patient alert and oriented, VSS, void, surgical site dry and clean no sign of infection. D/c instructions explain and given to the patient all questions answered. Patient d/c home per order.

## 2021-12-03 NOTE — Progress Notes (Signed)
Orthopedic Surgery Post-operative Progress Note  Assessment: Patient is a 66 y.o. male who is currently admitted after undergoing L3-5 laminectomy   Plan: -Operative plans complete -Will discharge with drains (instructions for care provided in discharge instructions and explained this morning)  -Out of bed as tolerated, no brace -No bending/lifting/twisting greater than 10 pounds -PT evaluate and treat -Pain control -Regular diet -No chemoprophylaxis for dvt or antiplatelets for 72 hours after surgery -Ancef x2 post-operative doses -Disposition: home today  ___________________________________________________________________________   Subjective: No acute events overnight. Walked the halls yesterday. Did not have any leg pain when walking. No leg pain this morning. Denies paresthesias and numbness. Would like to go home today.   Objective:  General: no acute distress, laying in bed Neurologic: alert, answering questions appropriately, following commands Respiratory: unlabored breathing on room air Skin: dressing clear/dry/intact, drains with sanguinous output  MSK (spine):  -Strength exam      Right  Left  EHL    5/5  5/5 TA    5/5  5/5 GSC    5/5  5/5 Knee extension  5/5  5/5 Hip flexion   5/5  5/5  -Sensory exam    Sensation intact to light touch in L3-S1 nerve distributions of bilateral lower extremities   Patient name: Brandon Frederick Patient MRN: [id number] Date: 12/03/21

## 2021-12-03 NOTE — Discharge Summary (Signed)
Orthopedic Surgery Discharge Summary  Patient name: Brandon Frederick Patient MRN: [id number] Date: 12/03/21  Attending physician: Ileene Rubens, MD Final diagnosis: Lumbar stenosis with neurogenic claudication Findings: L3/4 and L4/5 hypertrophic facets and thickened ligamentum flavum   Hospital course: Patient is a 66 y.o. male who was admitted after undergoing L3-5 laminectomies. The patient had significant pain immediately after surgery, but pain eventually was controlled with a multimodal regimen including oxycodone. The patient was able to walk the halls the night of surgery. His leg pain had improved with surgery. The patient worked with physical therapy who recommended discharge to home. The patient was tolerating an oral diet without issue and was voiding spontaneously after surgery. The patient's vitals were stable on the day of discharge. The patient's drains were left in place. He was given instructions for how to take care of the drains. He amy remove them at home or can come into the office to have them taken out. The patient was medically ready for discharge and was discharge to home on post-operative day 1.  Instructions:   Orthopedic Surgery Discharge Instructions  Patient name: Brandon Frederick Procedure Performed: L3-5 laminectomies Date of Surgery: 12/02/2021 Surgeon: Ileene Rubens, MD  Pre-operative Diagnosis: Lumbar stenosis with neurogenic claudication Post-operative Diagnosis: Same as above  Discharge Date: 12/03/2021 Discharged to: home Discharge Condition: stable  Activity: You should refrain from bending, lifting, or twisting with objects greater than ten pounds until three months after surgery. You are encouraged to walk as much as desired. You can perform household activities such as cleaning dishes, doing laundry, vacuuming, etc. as long as the ten-pound restriction is followed. You do not need to wear a brace during the post-operative period.   Incision Care:  Your incision site has a dressing over it. That dressing should remain in place and dry at all times for a total of one week after surgery. After one week, you can remove the dressing. Underneath the dressing, you will find pieces of tape. You should leave these pieces of tape in place. They will fall off with time. Do not pick, rub, or scrub at them. Do not put cream or lotion over the surgical area. After one week and once the dressing is off, it is okay to let soap and water run over your incision. Again, do not pick, scrub, or rub at the pieces of tape when bathing. Do not submerge (e.g., take a bath, swim, go in a hot tub, etc.) until six weeks after surgery. There may be some bloody drainage from the incision into the dressing after surgery. This is normal. You do not need to replace the dressing. Continue to leave it in place for the one week as instructed above. Should the dressing become saturated with blood or drainage, please call the office for further instructions.   Drains: The drains should be left in place until 11/30. Before pulling it, you may need to empty the canister. To do this, you remove the cap and dump the drainage into a receptacle. You then apply pressure to load the springs and then recap the canister to get the suction effect of the drain. Do this any time the canister gets near full. When removing the drains, remove the sticky and dressing around the drain. Do not remove the big dressing in the middle of the back. Then, cut one strand of suture near the skin surface. Take the cap off the drain to release the suction. Slowly but steadily remove that drain. Dry  blood that comes out of the drain hole with gauze. Apply pressure to help slow the blood. Place a gauze over the area and then the clear adhesive provided to you at the hospital. Repeat this process for the other drain. If you are uncomfortable or would prefer that I do the drain removal, come to the office anytime on Thursday  afternoon (11/30) or Friday (12/01) morning and I can do it. I am not in surgery and will be in the office at those times.   Medications: You have been prescribed oxycodone. This is a narcotic pain medication and should only be taken as prescribed. You should not drink alcohol or operate heavy machinery (including driving) while taking this medication. The oxycodone can cause constipation as a side effect. For that reason, you have been prescribed senna. This is a laxative. You do not need to take this medication if you develop diarrhea. Should you remain constipated even while taking the senna, please use over-the-counter miralax as instructed on the packaging to promote regular bowel movements. Tylenol has been prescribed to be taken every 8 hours, which will give you additional pain relief. Robaxin is a muscle relaxer that has been prescribed to you for muscle spasm type pain. Take this medication as needed. Gabapentin is a nerve medication that can provide additional pain relief. Take this three times per day for the first two weeks after surgery.   You can use over-the-counter NSAIDs (ibuprofen, Aleve, Celebrex, naproxen, meloxicam, aspirin, etc.) for additional pain relief after this surgery. You are safe to start these 72 hours after your surgery. These medications are safe to take with the Tylenol you have been prescribed. You should not take these medications if you have or have had kidney problems or gastrointestinal ulcers or have otherwise been instructed not to take these. Take these medications as instructed on the packaging.   In order to set expectations for opioid prescriptions, you will only be prescribed opioids for a total of six weeks after surgery and, at two-weeks after surgery, your opioid prescription will start to tapered (decreased dosage and number of pills). If you have ongoing need for opioid medication six weeks after surgery, you will be referred to pain management. If you are  already established with a provider that is giving you opioid medications, you should schedule an appointment with them for six weeks after surgery if you feel you are going to need another prescription. State law only allows for opioid prescriptions one week at a time. If you are running out of opioid medication near the end of the week, please call the office during business hours before running out so I can send you another prescription.   You may resume any home blood thinners (warfarin, lovenox, apixaban, plavix, xarelto, etc) 72 hours after your surgery. Take these medications as they were previously prescribed.  Driving: You should not drive while taking narcotic pain medications. You should start getting back to driving slowly and you may want to try driving in a parking lot before doing anything more.   Diet: You are safe to resume your regular diet after surgery.   Reasons to Call the Office After Surgery: You should feel free to call the office with any concerns or questions you have in the post-operative period, but you should definitely notify the office if you develop: -shortness of breath, chest pain, or trouble breathing -excessive bleeding, drainage, redness, or swelling around the surgical site -fevers, chills, or pain that is getting worse  with each passing day -persistent nausea or vomiting -new weakness in any extremity, new or worsening numbness or tingling in any extremity -numbness in the groin, bowel or bladder incontinence -other concerns about your surgery  Follow Up Appointments: You should have an office appointment scheduled for approximately two weeks after surgery. If you do not remember when this appointment is or do not already have it scheduled, please call the office to schedule.   Office Information:  -Dr. Ileene Rubens -Phone number: [phone] -Address: [address]       [place], [place] [address]

## 2021-12-05 ENCOUNTER — Ambulatory Visit (INDEPENDENT_AMBULATORY_CARE_PROVIDER_SITE_OTHER): Payer: Medicare Other | Admitting: Orthopedic Surgery

## 2021-12-05 ENCOUNTER — Encounter: Payer: Self-pay | Admitting: Orthopedic Surgery

## 2021-12-05 DIAGNOSIS — M48062 Spinal stenosis, lumbar region with neurogenic claudication: Secondary | ICD-10-CM

## 2021-12-05 MED ORDER — OXYCODONE HCL 5 MG PO TABS: 5.0000 mg | ORAL_TABLET | ORAL | 0 refills | Status: AC | PRN

## 2021-12-05 NOTE — Progress Notes (Signed)
Orthopedic Surgery Post-operative Office Visit  Procedure: L3-5 laminectomies Date of Surgery: 12/02/2021  Assessment: Patient is a 66 y.o. who is doing as expected after L3-5 laminectomies   Plan: -Operative plans complete -Drains removed today in office -Told him to use miralax BID until he has a BM then can go down to once a day -Out of bed as tolerated, no brace -No bending/lifting/twisting greater than 10 pounds -Pain management: use prescribed oxycodone -Return to office in ~1.5 weeks, lumbar x-rays needed at next visit: none  ___________________________________________________________________________   Subjective: Patient reports significant pain but medications are helping. Continuing to use the oxycodone he was prescribed. Has not noticed any redness or drainage from the incision. Drain output has slowing and they have not put out much lately. Denies paresthesias and numbness. Has not had a BM since surgery. Has been voiding.   Objective:  General: no acute distress, appropriate affect, walking without assistive devices Neurologic: alert, answering questions appropriately, following commands Respiratory: unlabored breathing on room air Skin: dressing over low back in place and c/d/i  MSK (spine):  -Strength exam      Left  Right  EHL    5/5  5/5 TA    5/5  5/5 GSC    5/5  5/5 Knee extension  5/5  5/5 Hip flexion   5/5  5/5  -Sensory exam    Sensation intact to light touch in L3-S1 nerve distributions of bilateral lower extremities  Imaging: No imaging obtained at today's visit   Patient name: Brandon Frederick Patient MRN: [id number] Date of visit: 12/05/21

## 2021-12-06 ENCOUNTER — Encounter: Payer: Self-pay | Admitting: Orthopedic Surgery

## 2021-12-16 ENCOUNTER — Ambulatory Visit (INDEPENDENT_AMBULATORY_CARE_PROVIDER_SITE_OTHER): Payer: Medicare Other | Admitting: Orthopedic Surgery

## 2021-12-16 DIAGNOSIS — M5416 Radiculopathy, lumbar region: Secondary | ICD-10-CM

## 2021-12-16 NOTE — Progress Notes (Addendum)
Orthopedic Surgery Post-operative Office Visit   Procedure: L3-5 laminectomies Date of Surgery: 12/02/2021 (~2 weeks from surgery)   Assessment: Patient is a 66 y.o. who is doing as expected after L3-5 laminectomies     Plan: -Operative plans complete -Dressing was removed by patient as instructed at home -Can continue to shower and let soap/water run over incision. Do not submerge wound until 6 weeks after surgery -Out of bed as tolerated, no brace -No bending/lifting/twisting greater than 10 pounds, will lift these restrictions at next visit -Pain management: is weaning on oxycodone previously prescribed -Return to office in 4 weeks, lumbar x-rays needed at next visit: AP/lateral lumbar films   ___________________________________________________________________________     Subjective: Has been doing well the last time he was seen in the office.  His back pain has gotten much better.  He has been weaning on his usage of the pain medication.  He took 1/2 tablet of oxycodone yesterday and took no tablets today.  He is not having any leg pain.  Denies paresthesias and numbness.  Has not noticed any redness or drainage around his incision.   Objective:   General: no acute distress, appropriate affect, walking without assistive devices Neurologic: alert, answering questions appropriately, following commands Respiratory: unlabored breathing on room air Skin: Incision appears well-approximated with no erythema, no induration, no tenderness to palpation.  There is no active or expressible drainage.   MSK (spine):   -Strength exam                                                   Left                  Right   EHL                              5/5                  5/5 TA                                 5/5                  5/5 GSC                             5/5                  5/5 Knee extension            5/5                  5/5 Hip flexion                    5/5                   5/5   -Sensory exam                           Sensation intact to light touch in L3-S1 nerve distributions of bilateral lower extremities   Imaging: No imaging obtained at today's visit     Patient name: Brandon Frederick  Brandon Frederick Patient MRN: [id number] Date of visit: 12/16/21

## 2021-12-17 ENCOUNTER — Encounter: Payer: Self-pay | Admitting: Orthopedic Surgery

## 2021-12-17 ENCOUNTER — Other Ambulatory Visit: Payer: Self-pay | Admitting: Family Medicine

## 2021-12-17 DIAGNOSIS — N529 Male erectile dysfunction, unspecified: Secondary | ICD-10-CM

## 2021-12-17 MED ORDER — METHOCARBAMOL 750 MG PO TABS: 750.0000 mg | ORAL_TABLET | Freq: Four times a day (QID) | ORAL | 0 refills | Status: AC

## 2022-01-10 ENCOUNTER — Encounter: Payer: Self-pay | Admitting: Cardiovascular Disease

## 2022-01-13 ENCOUNTER — Ambulatory Visit (INDEPENDENT_AMBULATORY_CARE_PROVIDER_SITE_OTHER): Payer: Medicare Other

## 2022-01-13 ENCOUNTER — Ambulatory Visit (INDEPENDENT_AMBULATORY_CARE_PROVIDER_SITE_OTHER): Payer: Medicare Other | Admitting: Orthopedic Surgery

## 2022-01-13 DIAGNOSIS — M48062 Spinal stenosis, lumbar region with neurogenic claudication: Secondary | ICD-10-CM

## 2022-01-13 NOTE — Progress Notes (Signed)
Orthopedic Surgery Post-operative Office Visit   Procedure: L3-5 laminectomies Date of Surgery: 12/02/2021 (~6 weeks from surgery)   Assessment: Patient is a 67 y.o. who is doing well after L3-5 laminectomies     Plan: -Operative plans complete -Okay to submerge wound at this point -Out of bed as tolerated, no brace -No spine specific restrictions -Pain management: OTC medications as needed -Return to office in 6 weeks, lumbar x-rays needed at next visit: AP/lateral/flex/ex lumbar films   ___________________________________________________________________________     Subjective: Has been doing well since he was last seen. Gets sore when he has been more active. Is only taking OTC as needed for pain relief. Feels he is better from pre-op. No more "shooting legs pains." Denies paresthesias and numbness. Has not noticed any redness or drainage around the incision.    Objective:   General: no acute distress, appropriate affect, walking without assistive devices Neurologic: alert, answering questions appropriately, following commands Respiratory: unlabored breathing on room air Skin: Incision appears well-healed with no evidence of infection   MSK (spine):   -Strength exam                                                   Left                  Right   EHL                              5/5                  5/5 TA                                 5/5                  5/5 GSC                             5/5                  5/5 Knee extension            5/5                  5/5 Hip flexion                    5/5                  5/5   -Sensory exam                           Sensation intact to light touch in L3-S1 nerve distributions of bilateral lower extremities   Imaging: X-rays of the lumbar spine taken and reviewed today show laminectomy defects from L3-5. No evidence of instability. No fracture seen.      Patient name: Brandon Frederick Patient MRN: [id number] Date of  visit: 01/13/22

## 2022-01-15 ENCOUNTER — Encounter: Payer: Self-pay | Admitting: Internal Medicine

## 2022-05-07 ENCOUNTER — Telehealth: Payer: Self-pay | Admitting: Family Medicine

## 2022-05-07 DIAGNOSIS — N400 Enlarged prostate without lower urinary tract symptoms: Secondary | ICD-10-CM

## 2022-05-07 DIAGNOSIS — E785 Hyperlipidemia, unspecified: Secondary | ICD-10-CM

## 2022-05-07 DIAGNOSIS — I2510 Atherosclerotic heart disease of native coronary artery without angina pectoris: Secondary | ICD-10-CM

## 2022-05-07 DIAGNOSIS — I10 Essential (primary) hypertension: Secondary | ICD-10-CM

## 2022-05-07 MED ORDER — ATORVASTATIN CALCIUM 80 MG PO TABS: 80.0000 mg | ORAL_TABLET | Freq: Every day | ORAL | 0 refills | Status: DC

## 2022-05-07 MED ORDER — FINASTERIDE 5 MG PO TABS: 5.0000 mg | ORAL_TABLET | Freq: Every day | ORAL | 0 refills | Status: DC

## 2022-05-07 MED ORDER — METOPROLOL SUCCINATE ER 25 MG PO TB24: 25.0000 mg | ORAL_TABLET | Freq: Every day | ORAL | 0 refills | Status: DC

## 2022-05-07 NOTE — Telephone Encounter (Signed)
Pharmacy sent refill request for  Lipitor Proscar Toprol-xl Please send to the [facility] pharmacy  [facility]

## 2022-05-07 NOTE — Telephone Encounter (Signed)
Done. Patient has appt on 05/20/2022. Will give courtesy refill.

## 2022-05-10 ENCOUNTER — Encounter: Payer: Self-pay | Admitting: Cardiovascular Disease

## 2022-05-10 ENCOUNTER — Encounter: Payer: Self-pay | Admitting: Family Medicine

## 2022-05-12 ENCOUNTER — Telehealth: Payer: Self-pay | Admitting: Family Medicine

## 2022-05-12 DIAGNOSIS — I2510 Atherosclerotic heart disease of native coronary artery without angina pectoris: Secondary | ICD-10-CM

## 2022-05-12 DIAGNOSIS — I10 Essential (primary) hypertension: Secondary | ICD-10-CM

## 2022-05-12 MED ORDER — RAMIPRIL 2.5 MG PO CAPS: 2.5000 mg | ORAL_CAPSULE | Freq: Two times a day (BID) | ORAL | 0 refills | Status: DC

## 2022-05-12 MED ORDER — CLOPIDOGREL BISULFATE 75 MG PO TABS: 75.0000 mg | ORAL_TABLET | Freq: Every day | ORAL | 0 refills | Status: DC

## 2022-05-12 NOTE — Telephone Encounter (Signed)
Pt also needs rx's sent for Clopidogrel Ramipril   [facility]

## 2022-05-12 NOTE — Telephone Encounter (Signed)
Done

## 2022-05-15 ENCOUNTER — Other Ambulatory Visit: Payer: Self-pay | Admitting: Family Medicine

## 2022-05-15 ENCOUNTER — Telehealth: Payer: Self-pay | Admitting: Family Medicine

## 2022-05-15 DIAGNOSIS — I10 Essential (primary) hypertension: Secondary | ICD-10-CM

## 2022-05-15 DIAGNOSIS — I2510 Atherosclerotic heart disease of native coronary artery without angina pectoris: Secondary | ICD-10-CM

## 2022-05-15 NOTE — Telephone Encounter (Signed)
Called pt to get updated pharmacy information and address of pharm as there are several [facility] locations.  Lmtrc.

## 2022-05-18 ENCOUNTER — Encounter: Payer: Self-pay | Admitting: Family Medicine

## 2022-05-20 ENCOUNTER — Ambulatory Visit (INDEPENDENT_AMBULATORY_CARE_PROVIDER_SITE_OTHER): Payer: Medicare Other | Admitting: Family Medicine

## 2022-05-20 ENCOUNTER — Encounter: Payer: Self-pay | Admitting: Family Medicine

## 2022-05-20 VITALS — BP 130/80 | HR 56 | Ht 74.0 in | Wt 254.8 lb

## 2022-05-20 DIAGNOSIS — Z85820 Personal history of malignant melanoma of skin: Secondary | ICD-10-CM

## 2022-05-20 DIAGNOSIS — N400 Enlarged prostate without lower urinary tract symptoms: Secondary | ICD-10-CM

## 2022-05-20 DIAGNOSIS — Z131 Encounter for screening for diabetes mellitus: Secondary | ICD-10-CM

## 2022-05-20 DIAGNOSIS — R7301 Impaired fasting glucose: Secondary | ICD-10-CM

## 2022-05-20 DIAGNOSIS — M4800 Spinal stenosis, site unspecified: Secondary | ICD-10-CM

## 2022-05-20 DIAGNOSIS — N529 Male erectile dysfunction, unspecified: Secondary | ICD-10-CM

## 2022-05-20 DIAGNOSIS — I10 Essential (primary) hypertension: Secondary | ICD-10-CM

## 2022-05-20 DIAGNOSIS — I2510 Atherosclerotic heart disease of native coronary artery without angina pectoris: Secondary | ICD-10-CM

## 2022-05-20 DIAGNOSIS — E669 Obesity, unspecified: Secondary | ICD-10-CM

## 2022-05-20 DIAGNOSIS — Z833 Family history of diabetes mellitus: Secondary | ICD-10-CM

## 2022-05-20 DIAGNOSIS — E785 Hyperlipidemia, unspecified: Secondary | ICD-10-CM

## 2022-05-20 LAB — POCT GLYCOSYLATED HEMOGLOBIN (HGB A1C): Hemoglobin A1C: 5.7 % — AB (ref 4.0–5.6)

## 2022-05-20 MED ORDER — FINASTERIDE 5 MG PO TABS: 5.0000 mg | ORAL_TABLET | Freq: Every day | ORAL | 0 refills | Status: DC

## 2022-05-20 MED ORDER — METOPROLOL SUCCINATE ER 25 MG PO TB24: 25.0000 mg | ORAL_TABLET | Freq: Every day | ORAL | 0 refills | Status: DC

## 2022-05-20 MED ORDER — TADALAFIL 20 MG PO TABS: 20.0000 mg | ORAL_TABLET | Freq: Every day | ORAL | 3 refills | Status: DC | PRN

## 2022-05-20 MED ORDER — ATORVASTATIN CALCIUM 80 MG PO TABS: 80.0000 mg | ORAL_TABLET | Freq: Every day | ORAL | 0 refills | Status: DC

## 2022-05-20 NOTE — Progress Notes (Signed)
Annual Wellness Visit  Patient: Brandon Frederick, Male    DOB: 10/28/1955, 67 y.o.   MRN: [id number]  Subjective  Chief Complaint  Patient presents with   Medicare Wellness    Fasting AWV, no concerns.     Ottavio L Marchesano is a 67 y.o. male who presents today for his Annual Wellness Visit. He reports consuming a general diet.  He generally feels fairly well. He reports sleeping fairly well. He sees cardiology regularly and presently is on Plavix as well as ramipril and metoprolol.  He continues on atorvastatin and is having no difficulty with that.  Continues on finasteride to help with his BPH symptoms.  He also uses Cialis for ED and thinks it also helps with his bladder function.  He does have a history of back pain and has had surgery.  He seems to be doing fairly well with this.  His allergies seem to be under fairly good control.  He does have a family history of diabetes.  He follows up regularly with dermatology for his diagnosis of melanoma.  Otherwise he has no particular questions or concerns.  HPI Vision:Within last year     Medications: Outpatient Medications Prior to Visit  Medication Sig Note   aspirin 81 MG tablet Take 1 tablet (81 mg total) by mouth daily.    atorvastatin (LIPITOR) 80 MG tablet Take 1 tablet (80 mg total) by mouth daily.    Cholecalciferol (VITAMIN D3) 125 MCG (5000 UT) TABS Take 5,000 Units by mouth in the morning.    clopidogrel (PLAVIX) 75 MG tablet Take 1 tablet (75 mg total) by mouth daily.    Coenzyme Q10-Vitamin E (QUNOL ULTRA COQ10 PO) Take 1 capsule by mouth every Monday, Wednesday, and Friday.    finasteride (PROSCAR) 5 MG tablet Take 1 tablet (5 mg total) by mouth daily.    loratadine (CLARITIN REDITABS) 10 MG dissolvable tablet Take 10 mg by mouth daily as needed for allergies.    metoprolol succinate (TOPROL-XL) 25 MG 24 hr tablet Take 1 tablet (25 mg total) by mouth daily.    ramipril (ALTACE) 2.5 MG capsule Take 1 capsule (2.5 mg total) by  mouth 2 (two) times daily.    nitroGLYCERIN (NITROSTAT) 0.4 MG SL tablet PLACE 1 TABLET UNDER THE   TONGUE AND ALLOW TO        DISSOLVE EVERY 5 MINUTES ASNEEDED FOR CHEST PAIN (Patient not taking: Reported on 05/20/2022)    tadalafil (CIALIS) 20 MG tablet TAKE 1 TABLET BY MOUTH ONCE DAILY AS NEEDED FOR ERECTILE DYSFUNCTION (Patient not taking: Reported on 05/20/2022) 05/20/2022: As needed   [DISCONTINUED] gabapentin (NEURONTIN) 300 MG capsule Take 1 capsule (300 mg total) by mouth 3 (three) times daily for 14 days.    No facility-administered medications prior to visit.    Allergies  Allergen Reactions   Dilaudid [Hydromorphone]     Patient states doesn't work for him   Hydrocodone-Acetaminophen     Patient states doesn't work for him  **Can only take Percocet for pain**    Patient Care Team: Ronnald Nian, MD as PCP - General (Family Medicine) Lennette Bihari, MD as PCP - Cardiology (Cardiology)  Review of Systems  All other systems reviewed and are negative.       Objective  BP 130/80   Pulse (!) 56   Ht 6\' 2"  (1.88 m)   Wt 254 lb 12.8 oz (115.6 kg)   SpO2 98%   BMI 32.71 kg/m  Physical Exam  Alert and in no distress. Tympanic membranes and canals are normal. Pharyngeal area is normal. Neck is supple without adenopathy or thyromegaly. Cardiac exam shows a regular sinus rhythm without murmurs or gallops. Lungs are clear to auscultation.   Most recent functional status assessment:    05/20/2022    8:32 AM  In your present state of health, do you have any difficulty performing the following activities:  Hearing? 0  Vision? 0  Difficulty concentrating or making decisions? 0  Walking or climbing stairs? 1  Dressing or bathing? 0  Doing errands, shopping? 0  Preparing Food and eating ? N  Using the Toilet? N  In the past six months, have you accidently leaked urine? N  Do you have problems with loss of bowel control? N  Managing your Medications? N  Managing  your Finances? N  Housekeeping or managing your Housekeeping? N   Most recent fall risk assessment:    05/20/2022    8:21 AM  Fall Risk   Falls in the past year? 0  Number falls in past yr: 0  Injury with Fall? 0  Risk for fall due to : No Fall Risks  Follow up Falls evaluation completed    Most recent depression screenings:    05/20/2022    8:21 AM 05/03/2021    2:03 PM  PHQ 2/9 Scores  PHQ - 2 Score 0 0  PHQ- 9 Score  0   Most recent cognitive screening:    05/03/2021    2:04 PM  6CIT Screen  What Year? 0 points  What month? 0 points  What time? 0 points  Count back from 20 0 points  Months in reverse 0 points  Repeat phrase 4 points  Total Score 4 points   Most recent Audit-C alcohol use screening    05/01/2021    3:41 PM  Alcohol Use Disorder Test (AUDIT)  1. How often do you have a drink containing alcohol? 3  2. How many drinks containing alcohol do you have on a typical day when you are drinking? 0  3. How often do you have six or more drinks on one occasion? 0  AUDIT-C Score 3   A score of 3 or more in women, and 4 or more in men indicates increased risk for alcohol abuse, EXCEPT if all of the points are from question 1   Vision/Hearing Screen: No results found.    No results found for any visits on 05/20/22.    Assessment & Plan  ASHD (arteriosclerotic heart disease) - Plan: metoprolol succinate (TOPROL-XL) 25 MG 24 hr tablet  Essential hypertension - Plan: metoprolol succinate (TOPROL-XL) 25 MG 24 hr tablet, CBC with Differential/Platelet, Comprehensive metabolic panel  Benign prostatic hyperplasia without lower urinary tract symptoms - Plan: finasteride (PROSCAR) 5 MG tablet  Spinal stenosis, unspecified spinal region  Mild obesity  Hyperlipidemia with target LDL less than 70 - Plan: atorvastatin (LIPITOR) 80 MG tablet, Lipid panel  History of melanoma  Erectile dysfunction, unspecified erectile dysfunction type - Plan: tadalafil (CIALIS)  20 MG tablet, DISCONTINUED: tadalafil (CIALIS) 20 MG tablet  Screening for diabetes mellitus - Plan: POCT glycosylated hemoglobin (Hb A1C)  Family history of diabetes mellitus in father  Annual wellness visit done today including the all of the following: Reviewed patient's Family Medical History Reviewed and updated list of patient's medical providers Assessment of cognitive impairment was done Assessed patient's functional ability Established a written schedule for health screening services Health Risk  Assessent Completed and Reviewed  Exercise Activities and Dietary recommendations  Goals      Patient Stated     05/03/2021, no goal        Immunization History  Administered Date(s) Administered   Fluad Quad(high Dose 65+) 10/27/2019   Influenza Inj Mdck Quad Pf 11/14/2016   Influenza Inj Mdck Quad With Preservative 11/05/2018   Influenza Split 11/20/2008, 10/25/2012, 11/06/2021   Influenza,inj,Quad PF,6+ Mos 09/26/2013, 09/13/2014, 09/09/2017   Influenza-Unspecified 09/25/2015, 11/14/2016, 11/07/2020   PFIZER(Purple Top)SARS-COV-2 Vaccination 03/10/2019, 04/06/2019   Pneumococcal Conjugate-13 02/16/2019   Pneumococcal Polysaccharide-23 04/27/2020   Tdap 01/06/2005, 04/08/2016   Zoster Recombinat (Shingrix) 04/08/2016, 07/15/2016   Zoster, Live 09/13/2014    Health Maintenance  Topic Date Due   Medicare Annual Wellness (AWV)  05/04/2022   INFLUENZA VACCINE  08/07/2022   COLONOSCOPY (Pts 45-29yrs Insurance coverage will need to be confirmed)  08/07/2025   DTaP/Tdap/Td (3 - Td or Tdap) 04/09/2026   Pneumonia Vaccine 71+ Years old  Completed   Hepatitis C Screening  Completed   Zoster Vaccines- Shingrix  Completed   HPV VACCINES  Aged Out   COVID-19 Vaccine  Discontinued     Discussed health benefits of physical activity, and encouraged him to engage in regular exercise appropriate for his age and condition.    Problem List Items Addressed This Visit     History  of melanoma   ASHD (arteriosclerotic heart disease)   Hyperlipidemia with target LDL less than 70   Essential hypertension - Primary   Mild obesity   Benign prostatic hyperplasia without lower urinary tract symptoms   Spinal stenosis   Other Visit Diagnoses     Erectile dysfunction, unspecified erectile dysfunction type       Screening for diabetes mellitus           No follow-ups on file.     Melonie Florida, RMA

## 2022-05-20 NOTE — Addendum Note (Signed)
Addended by: Ronnald Nian on: 05/20/2022 01:01 PM   Modules accepted: Level of Service

## 2022-05-21 LAB — COMPREHENSIVE METABOLIC PANEL
ALT: 34 IU/L (ref 0–44)
AST: 31 IU/L (ref 0–40)
Albumin/Globulin Ratio: 2.1 (ref 1.2–2.2)
Albumin: 4.6 g/dL (ref 3.9–4.9)
Alkaline Phosphatase: 99 IU/L (ref 44–121)
BUN/Creatinine Ratio: 12 (ref 10–24)
BUN: 13 mg/dL (ref 8–27)
Bilirubin Total: 0.9 mg/dL (ref 0.0–1.2)
CO2: 22 mmol/L (ref 20–29)
Calcium: 9.5 mg/dL (ref 8.6–10.2)
Chloride: 104 mmol/L (ref 96–106)
Creatinine, Ser: 1.07 mg/dL (ref 0.76–1.27)
Globulin, Total: 2.2 g/dL (ref 1.5–4.5)
Glucose: 100 mg/dL — ABNORMAL HIGH (ref 70–99)
Potassium: 4.4 mmol/L (ref 3.5–5.2)
Sodium: 140 mmol/L (ref 134–144)
Total Protein: 6.8 g/dL (ref 6.0–8.5)
eGFR: 76 mL/min/{1.73_m2} (ref >59)

## 2022-05-21 LAB — LIPID PANEL
Chol/HDL Ratio: 2.8 ratio (ref 0.0–5.0)
Cholesterol, Total: 139 mg/dL (ref 100–199)
HDL: 50 mg/dL (ref >39)
LDL Chol Calc (NIH): 59 mg/dL (ref 0–99)
Triglycerides: 183 mg/dL — ABNORMAL HIGH (ref 0–149)
VLDL Cholesterol Cal: 30 mg/dL (ref 5–40)

## 2022-05-21 LAB — CBC WITH DIFFERENTIAL/PLATELET
Basophils Absolute: 0.1 10*3/uL (ref 0.0–0.2)
Basos: 2 %
EOS (ABSOLUTE): 0.4 10*3/uL (ref 0.0–0.4)
Eos: 6 %
Hematocrit: 47.2 % (ref 37.5–51.0)
Hemoglobin: 16 g/dL (ref 13.0–17.7)
Immature Grans (Abs): 0 10*3/uL (ref 0.0–0.1)
Immature Granulocytes: 0 %
Lymphocytes Absolute: 1.1 10*3/uL (ref 0.7–3.1)
Lymphs: 17 %
MCH: 31.1 pg (ref 26.6–33.0)
MCHC: 33.9 g/dL (ref 31.5–35.7)
MCV: 92 fL (ref 79–97)
Monocytes Absolute: 0.8 10*3/uL (ref 0.1–0.9)
Monocytes: 12 %
Neutrophils Absolute: 4.3 10*3/uL (ref 1.4–7.0)
Neutrophils: 63 %
Platelets: 155 10*3/uL (ref 150–450)
RBC: 5.15 x10E6/uL (ref 4.14–5.80)
RDW: 14.7 % (ref 11.6–15.4)
WBC: 6.7 10*3/uL (ref 3.4–10.8)

## 2022-05-22 ENCOUNTER — Other Ambulatory Visit: Payer: Self-pay | Admitting: *Deleted

## 2022-05-22 DIAGNOSIS — E785 Hyperlipidemia, unspecified: Secondary | ICD-10-CM

## 2022-05-22 DIAGNOSIS — N529 Male erectile dysfunction, unspecified: Secondary | ICD-10-CM

## 2022-05-22 DIAGNOSIS — N400 Enlarged prostate without lower urinary tract symptoms: Secondary | ICD-10-CM

## 2022-05-22 DIAGNOSIS — I2510 Atherosclerotic heart disease of native coronary artery without angina pectoris: Secondary | ICD-10-CM

## 2022-05-22 DIAGNOSIS — I10 Essential (primary) hypertension: Secondary | ICD-10-CM

## 2022-05-22 MED ORDER — METOPROLOL SUCCINATE ER 25 MG PO TB24: 25.0000 mg | ORAL_TABLET | Freq: Every day | ORAL | 1 refills | Status: DC

## 2022-05-22 MED ORDER — TADALAFIL 20 MG PO TABS: 20.0000 mg | ORAL_TABLET | Freq: Every day | ORAL | 3 refills | Status: DC | PRN

## 2022-05-22 MED ORDER — FINASTERIDE 5 MG PO TABS: 5.0000 mg | ORAL_TABLET | Freq: Every day | ORAL | 1 refills | Status: DC

## 2022-05-22 MED ORDER — ATORVASTATIN CALCIUM 80 MG PO TABS: 80.0000 mg | ORAL_TABLET | Freq: Every day | ORAL | 1 refills | Status: DC

## 2022-05-22 NOTE — Telephone Encounter (Signed)
Taken care of. Sent in meds that JCL sent at visit on 05/20/22 as it was incorrect pharmacy.

## 2022-05-23 ENCOUNTER — Other Ambulatory Visit: Payer: Self-pay | Admitting: Medical

## 2022-05-23 DIAGNOSIS — N400 Enlarged prostate without lower urinary tract symptoms: Secondary | ICD-10-CM

## 2022-05-23 DIAGNOSIS — I10 Essential (primary) hypertension: Secondary | ICD-10-CM

## 2022-05-23 DIAGNOSIS — I2510 Atherosclerotic heart disease of native coronary artery without angina pectoris: Secondary | ICD-10-CM

## 2022-05-23 MED ORDER — METOPROLOL SUCCINATE ER 25 MG PO TB24: 25.0000 mg | ORAL_TABLET | Freq: Every day | ORAL | 1 refills | Status: DC

## 2022-05-23 MED ORDER — FINASTERIDE 5 MG PO TABS: 5.0000 mg | ORAL_TABLET | Freq: Every day | ORAL | 1 refills | Status: DC

## 2022-05-26 MED ORDER — RAMIPRIL 2.5 MG PO CAPS: 2.5000 mg | ORAL_CAPSULE | Freq: Two times a day (BID) | ORAL | 3 refills | Status: DC

## 2022-05-26 MED ORDER — CLOPIDOGREL BISULFATE 75 MG PO TABS: 75.0000 mg | ORAL_TABLET | Freq: Every day | ORAL | 3 refills | Status: DC

## 2022-05-28 ENCOUNTER — Encounter: Payer: Self-pay | Admitting: Family Medicine

## 2022-05-28 ENCOUNTER — Other Ambulatory Visit: Payer: Self-pay | Admitting: Medical

## 2022-05-28 DIAGNOSIS — N529 Male erectile dysfunction, unspecified: Secondary | ICD-10-CM

## 2022-05-28 MED ORDER — TADALAFIL 20 MG PO TABS: 20.0000 mg | ORAL_TABLET | Freq: Every day | ORAL | 0 refills | Status: DC | PRN

## 2022-06-04 ENCOUNTER — Other Ambulatory Visit: Payer: Self-pay | Admitting: Family Medicine

## 2022-06-04 DIAGNOSIS — N529 Male erectile dysfunction, unspecified: Secondary | ICD-10-CM

## 2022-06-04 NOTE — Telephone Encounter (Signed)
From: Alba Destine To: Office of Kristian Covey, [place] Sent: 06/04/2022 3:16 PM EDT Subject: Medication Renewal Request  Refills have been requested for the following medications:   tadalafil (CIALIS) 20 MG tablet [Shane Tysinger]  Preferred pharmacy: [id number] - [place], [place] - [address] Delivery method: [employer]

## 2022-07-06 ENCOUNTER — Encounter: Payer: Self-pay | Admitting: Orthopedic Surgery

## 2022-07-06 DIAGNOSIS — M545 Low back pain, unspecified: Secondary | ICD-10-CM

## 2022-07-18 ENCOUNTER — Other Ambulatory Visit: Payer: Self-pay | Admitting: Family Medicine

## 2022-07-18 DIAGNOSIS — I10 Essential (primary) hypertension: Secondary | ICD-10-CM

## 2022-07-18 DIAGNOSIS — I2510 Atherosclerotic heart disease of native coronary artery without angina pectoris: Secondary | ICD-10-CM

## 2022-07-22 ENCOUNTER — Other Ambulatory Visit: Payer: Self-pay

## 2022-07-22 ENCOUNTER — Encounter: Payer: Self-pay | Admitting: Physical Therapy

## 2022-07-22 ENCOUNTER — Ambulatory Visit: Payer: Medicare Other | Admitting: Physical Therapy

## 2022-07-22 DIAGNOSIS — M5459 Other low back pain: Secondary | ICD-10-CM | POA: Diagnosis not present

## 2022-07-22 DIAGNOSIS — R293 Abnormal posture: Secondary | ICD-10-CM | POA: Diagnosis not present

## 2022-07-22 DIAGNOSIS — M6281 Muscle weakness (generalized): Secondary | ICD-10-CM | POA: Diagnosis not present

## 2022-07-22 NOTE — Therapy (Signed)
OUTPATIENT PHYSICAL THERAPY THORACOLUMBAR EVALUATION   Patient Name: DAWSEN KRIEGER MRN: [id number] DOB:[birth year], 67 y.o., male Today's Date: 07/22/2022  END OF SESSION:  PT End of Session - 07/22/22 0821     Visit Number 1    Number of Visits 13    Date for PT Re-Evaluation 09/02/22    Authorization Type MCR and BCBS    Authorization Time Period 07/22/22 to 09/02/22    Progress Note Due on Visit 10    PT Start Time 0801    PT Stop Time 0843    PT Time Calculation (min) 42 min    Activity Tolerance Patient tolerated treatment well    Behavior During Therapy [facility] for tasks assessed/performed             Past Medical History:  Diagnosis Date   Arthritis    ASHD (arteriosclerotic heart disease)    stent   Dyslipidemia    History of stress test 09/2011   Which showed his previously documented scarring anteriorly and apically, concordant with his LAD infarction. Post-stress EF 40%   Hx of echocardiogram    showed an EF of approximately 40% with moderate-to-severe anterior wall hypokenisis and moderate-to-severe apical wall hypokinesis. He did have mild aortic sclerosis. There was mild pulmonary hypertensin with estimated RV systolic pressure of 34 mm.   Hyperlipidemia    Melanoma (HCC)    torso   MI (myocardial infarction) (HCC) 2004, 2009   Large Anterior wall, at which he underwent stenting of his promixal occluded LAD with ultimate insertion of a 3.5 x 28 mm Cypher stent post dilated to 4.1 cm.   Past Surgical History:  Procedure Laterality Date   CARDIAC CATHETERIZATION  06/2007    revealed a progressive disease in the mid LAD, and he underwent insertion of a new mid-LAD stents as well stenting of his PLA branch of his right coronary artery with a 2.75 x 30 mm Cypher stent post dilated 3.25 mm. He did also have some mild additional concomitant CAD.   COLONOSCOPY  2019   CORONARY ANGIOPLASTY WITH STENT PLACEMENT     CORONARY STENT INTERVENTION N/A 11/30/2017    Procedure: CORONARY STENT INTERVENTION;  Surgeon: Corky Crafts, MD;  Location: [facility] INVASIVE CV LAB;  Service: Cardiovascular;  Laterality: N/A;   DECOMPRESSIVE LUMBAR LAMINECTOMY LEVEL 2 N/A 12/02/2021   Procedure: L3-4 AND L4-5 LUMBAR LAMINECTOMY LEVEL 2;  Surgeon: London Sheer, MD;  Location: [facility] OR;  Service: Orthopedics;  Laterality: N/A;   FOOT FRACTURE SURGERY Right 2019   LEFT HEART CATH AND CORONARY ANGIOGRAPHY N/A 11/30/2017   Procedure: LEFT HEART CATH AND CORONARY ANGIOGRAPHY;  Surgeon: Corky Crafts, MD;  Location: [facility] INVASIVE CV LAB;  Service: Cardiovascular;  Laterality: N/A;   Patient Active Problem List   Diagnosis Date Noted   Spinal stenosis 12/02/2021   Benign prostatic hyperplasia without lower urinary tract symptoms 09/09/2017   Mild obesity 09/26/2014   Essential hypertension 02/26/2013   Hyperlipidemia with target LDL less than 70 08/23/2012   History of melanoma 07/28/2011   ASHD (arteriosclerotic heart disease) 07/28/2011    PCP: Sharlot Gowda MD   REFERRING PROVIDER: London Sheer, MD  REFERRING DIAG: M54.50 (ICD-10-CM) - Acute midline low back pain without sciatica  Rationale for Evaluation and Treatment: Rehabilitation  THERAPY DIAG:  Other low back pain  Abnormal posture  Muscle weakness (generalized)  ONSET DATE: surgery in November 2023  SUBJECTIVE:  SUBJECTIVE STATEMENT:  I've been having some lower back pain, Dr. Christell Constant operated on me in November (L4 and L5 laminectomy per chart), I felt good for awhile but a couple of months ago I started having that same feeling in my low back. In the mornings I just feel it, by the end of the day it hurts and I'm tired, I get real stiff too. 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 MD recommended PT.   PERTINENT HISTORY:  OA, ASHD, HLD, melanoma, MI, cardiac cath, lumbar laminectomy, foot fx surgery 2019  PAIN:  Are you having pain? Yes: NPRS scale: "low"- no number given, 1-2/10 Pain location: R lumbar region  Pain description: stiffness/tiredness/pain Aggravating factors: doing a lot, getting tired by the end of the day Relieving factors: resting with back support, tylenol/advil   PRECAUTIONS: None  RED FLAGS: None   WEIGHT BEARING RESTRICTIONS: No  FALLS:  Has patient fallen in last 6 months? No, no FOF   LIVING ENVIRONMENT: Lives with: lives with their family Lives in: House/apartment Stairs: none besides a couple of steps into a sunken living room  Has following equipment at home: None  OCCUPATION: retired- used to work for [employer]   PLOF: Independent, Independent with basic ADLs, Independent with gait, and Independent with transfers  PATIENT GOALS: be more flexible, get rid of pain as much as possible   NEXT MD VISIT: Dr. Christell Constant PRN   OBJECTIVE:     PATIENT SURVEYS:  FOTO 46, predicted 58 in 13 visits   SCREENING FOR RED FLAGS: Bowel or bladder incontinence: No Spinal tumors: No Cauda equina syndrome: No Compression fracture: No Abdominal aneurysm: No  COGNITION: Overall cognitive status: Within functional limits for tasks assessed     SENSATION: Not tested  MUSCLE LENGTH:  Quads: moderate limitation B  Hamstrings: mild limitation R, severe limitation L  Piriformis: moderate limitation R, severe limitation L  Hip flexors moderate limitation B per observation   POSTURE: rounded shoulders, forward head, decreased lumbar lordosis, increased thoracic kyphosis, and flexed trunk   PALPATION: TTP R lumbar area at L5 level  LUMBAR ROM:   AROM eval  Flexion Mild limitation, hamstrings tight   Extension WNL but pain R lumbar area   Right lateral flexion  WNL   Left lateral flexion WNL   Right rotation Moderate limitation   Left rotation Moderate limitation    (Blank rows = not tested)    LOWER EXTREMITY MMT:    MMT Right eval Left eval  Hip flexion 4+ 4+  Hip extension 4 4  Hip abduction 4+ 4+  Hip adduction    Hip internal rotation    Hip external rotation    Knee flexion 4+ 4+  Knee extension 4+ 4+  Ankle dorsiflexion 4+ 4+  Ankle plantarflexion    Ankle inversion    Ankle eversion     (Blank rows = not tested)   TODAY'S TREATMENT:  DATE:    Eval  Objective measures, appropriate education, care planning  TherEx   SciFit bike L4 x6 minutes PPT x10 with 3 second holds Figure 4 stretch 2x30 seconds B SKTC 5x5 seconds B      PATIENT EDUCATION:  Education details: exam findings, POC, HEP  Person educated: Patient Education method: Programmer, multimedia, Demonstration, and Handouts Education comprehension: verbalized understanding, returned demonstration, and needs further education  HOME EXERCISE PROGRAM: Access Code: [email or web address] URL: [email or web address] Date: 07/22/2022 Prepared by: Nedra Hai  Exercises - Supine Posterior Pelvic Tilt  - 2 x daily - 7 x weekly - 1 sets - 10 reps - 3 seconds  hold - Supine Figure 4 Piriformis Stretch  - 2 x daily - 7 x weekly - 1 sets - 2 reps - 30 hold - Supine Single Knee to Chest Stretch  - 2 x daily - 7 x weekly - 1 sets - 5 reps - 5  seconds  hold - Supine Lower Trunk Rotation  - 2 x daily - 7 x weekly - 1 sets - 5 reps - 5 hold  ASSESSMENT:  CLINICAL IMPRESSION: Patient is a 67 y.o. M who was seen today for physical therapy evaluation and treatment for low back pain. Of note he did have lumbar laminectomy November of last year, had been doing well but recently had an increase in low back pain/stiffness. Exam is typical and objective  measurements reveal impairments as above. Will benefit from skilled PT services in order to address all findings and improve QOL/level of function.    OBJECTIVE IMPAIRMENTS: decreased activity tolerance, decreased mobility, difficulty walking, decreased ROM, decreased strength, hypomobility, increased muscle spasms, impaired flexibility, improper body mechanics, postural dysfunction, obesity, and pain.   ACTIVITY LIMITATIONS: carrying, lifting, bending, squatting, transfers, bed mobility, and locomotion level  PARTICIPATION LIMITATIONS: driving, shopping, community activity, yard work, and church  PERSONAL FACTORS: Age, Behavior pattern, Education, Fitness, Past/current experiences, Social background, and Time since onset of injury/illness/exacerbation are also affecting patient's functional outcome.   REHAB POTENTIAL: Good  CLINICAL DECISION MAKING: Stable/uncomplicated  EVALUATION COMPLEXITY: Low   GOALS: Goals reviewed with patient? Yes  SHORT TERM GOALS: Target date: 08/12/2022    Will be compliant with appropriate progressive HEP  Baseline: Goal status: INITIAL  2.  Lumbar ROM to be WNL all planes without increased pain Baseline:  Goal status: INITIAL  3.  LE flexibility to have improved by at least 50%  Baseline:  Goal status: INITIAL  4.  Will demonstrate correct functional biomechanics for bed mobility and floor to waist lifting  Baseline:  Goal status: INITIAL    LONG TERM GOALS: Target date: 09/02/2022    MMT to be 5/5 globally  Baseline:  Goal status: INITIAL  2.  Morning stiffness to have improved by 75%  Baseline:  Goal status: INITIAL  3.  Fatigue/increased back pain by the end of a busy day to have improved by 75%   Baseline:  Goal status: INITIAL  4.  Will be able to perform all daily tasks without increase from resting pain levels  Baseline:  Goal status: INITIAL  5.  FOTO score to be within 5 points of predicted by DC  Baseline:  Goal  status: INITIAL    PLAN:  PT FREQUENCY: 2x/week  PT DURATION: 6 weeks  PLANNED INTERVENTIONS: Therapeutic exercises, Therapeutic activity, Patient/Family education, Self Care, Joint mobilization, Aquatic Therapy, Dry Needling, Electrical stimulation, Cryotherapy, Moist heat, Taping, Ultrasound, Ionotophoresis 4mg /ml Dexamethasone, Manual therapy, and Re-evaluation.  PLAN  FOR NEXT SESSION: focus on lumbar ROM, core strength, LE flexibility, biomechanics   Nedra Hai, PT, DPT 07/22/22 8:43 AM

## 2022-07-30 ENCOUNTER — Ambulatory Visit (INDEPENDENT_AMBULATORY_CARE_PROVIDER_SITE_OTHER): Payer: Medicare Other | Admitting: Rehabilitative and Restorative Service Providers"

## 2022-07-30 ENCOUNTER — Encounter: Payer: Self-pay | Admitting: Rehabilitative and Restorative Service Providers"

## 2022-07-30 DIAGNOSIS — R293 Abnormal posture: Secondary | ICD-10-CM | POA: Diagnosis not present

## 2022-07-30 DIAGNOSIS — M5459 Other low back pain: Secondary | ICD-10-CM

## 2022-07-30 DIAGNOSIS — M6281 Muscle weakness (generalized): Secondary | ICD-10-CM | POA: Diagnosis not present

## 2022-07-30 NOTE — Therapy (Signed)
OUTPATIENT PHYSICAL THERAPY TREATMENT   Patient Name: Brandon Frederick MRN: [id number] DOB:06-Jan-1956, 67 y.o., male Today's Date: 07/30/2022  END OF SESSION:  PT End of Session - 07/30/22 1209     Visit Number 2    Number of Visits 13    Date for PT Re-Evaluation 09/02/22    Authorization Type MCR and BCBS    Authorization Time Period 07/22/22 to 09/02/22    Progress Note Due on Visit 10    PT Start Time 1141    PT Stop Time 1206    PT Time Calculation (min) 25 min    Activity Tolerance Patient tolerated treatment well    Behavior During Therapy [facility] for tasks assessed/performed              Past Medical History:  Diagnosis Date   Arthritis    ASHD (arteriosclerotic heart disease)    stent   Dyslipidemia    History of stress test 09/2011   Which showed his previously documented scarring anteriorly and apically, concordant with his LAD infarction. Post-stress EF 40%   Hx of echocardiogram    showed an EF of approximately 40% with moderate-to-severe anterior wall hypokenisis and moderate-to-severe apical wall hypokinesis. He did have mild aortic sclerosis. There was mild pulmonary hypertensin with estimated RV systolic pressure of 34 mm.   Hyperlipidemia    Melanoma (HCC)    torso   MI (myocardial infarction) (HCC) 2004, 2009   Large Anterior wall, at which he underwent stenting of his promixal occluded LAD with ultimate insertion of a 3.5 x 28 mm Cypher stent post dilated to 4.1 cm.   Past Surgical History:  Procedure Laterality Date   CARDIAC CATHETERIZATION  06/2007    revealed a progressive disease in the mid LAD, and he underwent insertion of a new mid-LAD stents as well stenting of his PLA branch of his right coronary artery with a 2.75 x 30 mm Cypher stent post dilated 3.25 mm. He did also have some mild additional concomitant CAD.   COLONOSCOPY  2019   CORONARY ANGIOPLASTY WITH STENT PLACEMENT     CORONARY STENT INTERVENTION N/A 11/30/2017   Procedure:  CORONARY STENT INTERVENTION;  Surgeon: Corky Crafts, MD;  Location: [facility] INVASIVE CV LAB;  Service: Cardiovascular;  Laterality: N/A;   DECOMPRESSIVE LUMBAR LAMINECTOMY LEVEL 2 N/A 12/02/2021   Procedure: L3-4 AND L4-5 LUMBAR LAMINECTOMY LEVEL 2;  Surgeon: London Sheer, MD;  Location: [facility] OR;  Service: Orthopedics;  Laterality: N/A;   FOOT FRACTURE SURGERY Right 2019   LEFT HEART CATH AND CORONARY ANGIOGRAPHY N/A 11/30/2017   Procedure: LEFT HEART CATH AND CORONARY ANGIOGRAPHY;  Surgeon: Corky Crafts, MD;  Location: [facility] INVASIVE CV LAB;  Service: Cardiovascular;  Laterality: N/A;   Patient Active Problem List   Diagnosis Date Noted   Spinal stenosis 12/02/2021   Benign prostatic hyperplasia without lower urinary tract symptoms 09/09/2017   Mild obesity 09/26/2014   Essential hypertension 02/26/2013   Hyperlipidemia with target LDL less than 70 08/23/2012   History of melanoma 07/28/2011   ASHD (arteriosclerotic heart disease) 07/28/2011    PCP: Sharlot Gowda MD   REFERRING PROVIDER: London Sheer, MD  REFERRING DIAG: M54.50 (ICD-10-CM) - Acute midline low back pain without sciatica  Rationale for Evaluation and Treatment: Rehabilitation  THERAPY DIAG:  Other low back pain  Abnormal posture  Muscle weakness (generalized)  ONSET DATE: surgery in November 2023  SUBJECTIVE:  SUBJECTIVE STATEMENT: Worse in evening than in morning.  Pt indicated tightness upon arrival today.  Maybe a little better overall.   PERTINENT HISTORY:  OA, ASHD, HLD, melanoma, MI, cardiac cath, lumbar laminectomy, foot fx surgery 2019  PAIN:   NPRS scale: "low"- no number given, 1-2/10 Pain location: R lumbar region  Pain description: stiffness/tiredness/pain Aggravating factors: doing a lot, getting  tired by the end of the day Relieving factors: resting with back support, tylenol/advil   PRECAUTIONS: None  RED FLAGS: None   WEIGHT BEARING RESTRICTIONS: No  FALLS:  Has patient fallen in last 6 months? No, no FOF   LIVING ENVIRONMENT: Lives with: lives with their family Lives in: House/apartment Stairs: none besides a couple of steps into a sunken living room  Has following equipment at home: None  OCCUPATION: retired- used to work for [employer]   PLOF: Independent, Independent with basic ADLs, Independent with gait, and Independent with transfers  PATIENT GOALS: be more flexible, get rid of pain as much as possible   NEXT MD VISIT: Dr. Christell Constant PRN   OBJECTIVE:   PATIENT SURVEYS:  07/22/2022 FOTO 46, predicted 58 in 13 visits   SCREENING FOR RED FLAGS: 07/22/2022 Bowel or bladder incontinence: No Spinal tumors: No Cauda equina syndrome: No Compression fracture: No Abdominal aneurysm: No  COGNITION: 07/22/2022 Overall cognitive status: Within functional limits for tasks assessed     SENSATION: 07/22/2022 Not tested  MUSCLE LENGTH: 07/22/2022  Quads: moderate limitation B  Hamstrings: mild limitation R, severe limitation L  Piriformis: moderate limitation R, severe limitation L  Hip flexors moderate limitation B per observation   POSTURE:  7/16/2024rounded shoulders, forward head, decreased lumbar lordosis, increased thoracic kyphosis, and flexed trunk   PALPATION: 07/22/2022 TTP Rt lumbar area at L5 level  LUMBAR ROM:   AROM 07/22/2022 07/30/2022  Flexion Mild limitation, hamstrings tight    Extension WNL but pain R lumbar area  75 % WFL with end range pain Rt lumbar   Right lateral flexion WNL  WFL with complaints on Rt side   Left lateral flexion WNL    Right rotation Moderate limitation    Left rotation Moderate limitation     (Blank rows = not tested)    LOWER EXTREMITY MMT:    MMT Right 07/22/2022 Left 07/22/2022  Hip flexion 4+ 4+  Hip  extension 4 4  Hip abduction 4+ 4+  Hip adduction    Hip internal rotation    Hip external rotation    Knee flexion 4+ 4+  Knee extension 4+ 4+  Ankle dorsiflexion 4+ 4+  Ankle plantarflexion    Ankle inversion    Ankle eversion     (Blank rows = not tested)                   TODAY'S TREATMENT:                                                       DATE:  07/30/2022 Therex: Review of existing HEP c cues for updates. Standing lumbar extension AROM x 5  Nustep lvl 6 UE/LE 8 mins   Manual Prone cPA L3-L5 g4 mobs.  Skilled palpation with DN  Trigger Point Dry-Needling  Treatment instructions: Expect mild to moderate muscle soreness. S/S of pneumothorax if dry needled over a lung  field, and to seek immediate medical attention should they occur. Patient verbalized understanding of these instructions and education.  Patient Consent Given: Yes Education handout provided: Yes Muscles treated: Rt QL, inferior paraspinals lumbar Treatment response/outcome: Concordant twitch response. No adverse reaction noted.    TODAY'S TREATMENT:                                                       DATE:  07/22/2022 Objective measures, appropriate education, care planning  TherEx   SciFit bike L4 x6 minutes PPT x10 with 3 second holds Figure 4 stretch 2x30 seconds B SKTC 5x5 seconds B      PATIENT EDUCATION:  07/30/2022 Education details: HEP update, DN Person educated: Patient Education method: Explanation, Demonstration, and Handouts Education comprehension: verbalized understanding, returned demonstration, and needs further education  HOME EXERCISE PROGRAM: Access Code: [email or web address] URL: [email or web address] Date: 07/30/2022 Prepared by: Chyrel Masson  Exercises - Supine Posterior Pelvic Tilt  - 2 x daily - 7 x weekly - 1 sets - 10 reps - 3 seconds  hold - Supine Figure 4 Piriformis Stretch  - 2 x daily - 7 x weekly - 1 sets - 2 reps - 30 hold - Supine Single Knee to  Chest Stretch  - 2 x daily - 7 x weekly - 1 sets - 5 reps - 5  seconds  hold - Supine Lower Trunk Rotation  - 2 x daily - 7 x weekly - 1 sets - 5 reps - 5 hold - Standing Lumbar Extension with Counter  - 3-5 x daily - 7 x weekly - 1 sets - 5-10 reps  ASSESSMENT:  CLINICAL IMPRESSION: Concordant symptoms c palpation and trigger point release from QL and inferior paraspinals in lumbar.  Joint mobility restriction also noted in lower lumbar.  Plan to reassess response from manual and dry needling.    OBJECTIVE IMPAIRMENTS: decreased activity tolerance, decreased mobility, difficulty walking, decreased ROM, decreased strength, hypomobility, increased muscle spasms, impaired flexibility, improper body mechanics, postural dysfunction, obesity, and pain.   ACTIVITY LIMITATIONS: carrying, lifting, bending, squatting, transfers, bed mobility, and locomotion level  PARTICIPATION LIMITATIONS: driving, shopping, community activity, yard work, and church  PERSONAL FACTORS: Age, Behavior pattern, Education, Fitness, Past/current experiences, Social background, and Time since onset of injury/illness/exacerbation are also affecting patient's functional outcome.   REHAB POTENTIAL: Good  CLINICAL DECISION MAKING: Stable/uncomplicated  EVALUATION COMPLEXITY: Low   GOALS: Goals reviewed with patient? Yes  SHORT TERM GOALS: Target date: 08/12/2022    Will be compliant with appropriate progressive HEP  Baseline: Goal status: on going 07/30/2022  2.  Lumbar ROM to be WNL all planes without increased pain Baseline:  Goal status: on going 07/30/2022  3.  LE flexibility to have improved by at least 50%  Baseline:  Goal status: on going 07/30/2022  4.  Will demonstrate correct functional biomechanics for bed mobility and floor to waist lifting  Baseline:  Goal status: on going 07/30/2022    LONG TERM GOALS: Target date: 09/02/2022    MMT to be 5/5 globally  Baseline:  Goal status: INITIAL  2.   Morning stiffness to have improved by 75%  Baseline:  Goal status: INITIAL  3.  Fatigue/increased back pain by the end of a busy day to have improved by 75%   Baseline:  Goal status:  INITIAL  4.  Will be able to perform all daily tasks without increase from resting pain levels  Baseline:  Goal status: INITIAL  5.  FOTO score to be within 5 points of predicted by DC  Baseline:  Goal status: INITIAL    PLAN:  PT FREQUENCY: 2x/week  PT DURATION: 6 weeks  PLANNED INTERVENTIONS: Therapeutic exercises, Therapeutic activity, Patient/Family education, Self Care, Joint mobilization, Aquatic Therapy, Dry Needling, Electrical stimulation, Cryotherapy, Moist heat, Taping, Ultrasound, Ionotophoresis 4mg /ml Dexamethasone, Manual therapy, and Re-evaluation.  PLAN FOR NEXT SESSION: DN follow up.  Reduce frequency as necessary as symptoms improve.   Chyrel Masson, PT, DPT, OCS, ATC 07/30/22  12:09 PM

## 2022-07-31 ENCOUNTER — Encounter: Payer: Self-pay | Admitting: Orthopedic Surgery

## 2022-07-31 ENCOUNTER — Encounter: Payer: Self-pay | Admitting: Family Medicine

## 2022-08-01 ENCOUNTER — Encounter: Payer: Medicare Other | Admitting: Rehabilitative and Restorative Service Providers"

## 2022-08-05 ENCOUNTER — Encounter: Payer: Self-pay | Admitting: Rehabilitative and Restorative Service Providers"

## 2022-08-05 ENCOUNTER — Ambulatory Visit (INDEPENDENT_AMBULATORY_CARE_PROVIDER_SITE_OTHER): Payer: Medicare Other | Admitting: Rehabilitative and Restorative Service Providers"

## 2022-08-05 DIAGNOSIS — M6281 Muscle weakness (generalized): Secondary | ICD-10-CM | POA: Diagnosis not present

## 2022-08-05 DIAGNOSIS — M5459 Other low back pain: Secondary | ICD-10-CM | POA: Diagnosis not present

## 2022-08-05 DIAGNOSIS — R293 Abnormal posture: Secondary | ICD-10-CM | POA: Diagnosis not present

## 2022-08-05 NOTE — Therapy (Signed)
OUTPATIENT PHYSICAL THERAPY TREATMENT   Patient Name: Brandon Frederick MRN: [id number] DOB:1955-08-02, 67 y.o., male Today's Date: 08/05/2022  END OF SESSION:  PT End of Session - 08/05/22 0839     Visit Number 3    Number of Visits 13    Date for PT Re-Evaluation 09/02/22    Authorization Type MCR and BCBS    Authorization Time Period 07/22/22 to 09/02/22    Progress Note Due on Visit 10    PT Start Time 0846    PT Stop Time 0915    PT Time Calculation (min) 29 min    Activity Tolerance Patient tolerated treatment well    Behavior During Therapy [facility] for tasks assessed/performed               Past Medical History:  Diagnosis Date   Arthritis    ASHD (arteriosclerotic heart disease)    stent   Dyslipidemia    History of stress test 09/2011   Which showed his previously documented scarring anteriorly and apically, concordant with his LAD infarction. Post-stress EF 40%   Hx of echocardiogram    showed an EF of approximately 40% with moderate-to-severe anterior wall hypokenisis and moderate-to-severe apical wall hypokinesis. He did have mild aortic sclerosis. There was mild pulmonary hypertensin with estimated RV systolic pressure of 34 mm.   Hyperlipidemia    Melanoma (HCC)    torso   MI (myocardial infarction) (HCC) 2004, 2009   Large Anterior wall, at which he underwent stenting of his promixal occluded LAD with ultimate insertion of a 3.5 x 28 mm Cypher stent post dilated to 4.1 cm.   Past Surgical History:  Procedure Laterality Date   CARDIAC CATHETERIZATION  06/2007    revealed a progressive disease in the mid LAD, and he underwent insertion of a new mid-LAD stents as well stenting of his PLA branch of his right coronary artery with a 2.75 x 30 mm Cypher stent post dilated 3.25 mm. He did also have some mild additional concomitant CAD.   COLONOSCOPY  2019   CORONARY ANGIOPLASTY WITH STENT PLACEMENT     CORONARY STENT INTERVENTION N/A 11/30/2017   Procedure:  CORONARY STENT INTERVENTION;  Surgeon: Corky Crafts, MD;  Location: [facility] INVASIVE CV LAB;  Service: Cardiovascular;  Laterality: N/A;   DECOMPRESSIVE LUMBAR LAMINECTOMY LEVEL 2 N/A 12/02/2021   Procedure: L3-4 AND L4-5 LUMBAR LAMINECTOMY LEVEL 2;  Surgeon: London Sheer, MD;  Location: [facility] OR;  Service: Orthopedics;  Laterality: N/A;   FOOT FRACTURE SURGERY Right 2019   LEFT HEART CATH AND CORONARY ANGIOGRAPHY N/A 11/30/2017   Procedure: LEFT HEART CATH AND CORONARY ANGIOGRAPHY;  Surgeon: Corky Crafts, MD;  Location: [facility] INVASIVE CV LAB;  Service: Cardiovascular;  Laterality: N/A;   Patient Active Problem List   Diagnosis Date Noted   Spinal stenosis 12/02/2021   Benign prostatic hyperplasia without lower urinary tract symptoms 09/09/2017   Mild obesity 09/26/2014   Essential hypertension 02/26/2013   Hyperlipidemia with target LDL less than 70 08/23/2012   History of melanoma 07/28/2011   ASHD (arteriosclerotic heart disease) 07/28/2011    PCP: Sharlot Gowda MD   REFERRING PROVIDER: London Sheer, MD  REFERRING DIAG: M54.50 (ICD-10-CM) - Acute midline low back pain without sciatica  Rationale for Evaluation and Treatment: Rehabilitation  THERAPY DIAG:  Other low back pain  Abnormal posture  Muscle weakness (generalized)  ONSET DATE: surgery in November 2023  SUBJECTIVE:  SUBJECTIVE STATEMENT: Pt indicated having increased activity yesterday and felt tighter and more sore today.  He reported good until yesterday/today.  Reported he thought the needling helped from last visit.   PERTINENT HISTORY:  OA, ASHD, HLD, melanoma, MI, cardiac cath, lumbar laminectomy, foot fx surgery 2019  PAIN:   NPRS scale: No specific pain number.  Pain location: Rt lumbar region  Pain  description: tightness, pain at rest Aggravating factors: doing a lot, getting tired by the end of the day Relieving factors: resting with back support, tylenol/advil   PRECAUTIONS: None  RED FLAGS: None   WEIGHT BEARING RESTRICTIONS: No  FALLS:  Has patient fallen in last 6 months? No, no FOF   LIVING ENVIRONMENT: Lives with: lives with their family Lives in: House/apartment Stairs: none besides a couple of steps into a sunken living room  Has following equipment at home: None  OCCUPATION: retired- used to work for [employer]   PLOF: Independent, Independent with basic ADLs, Independent with gait, and Independent with transfers  PATIENT GOALS: be more flexible, get rid of pain as much as possible   NEXT MD VISIT: Dr. Christell Constant PRN   OBJECTIVE:   PATIENT SURVEYS:    07/22/2022 FOTO 46, predicted 58 in 13 visits   SCREENING FOR RED FLAGS: 07/22/2022 Bowel or bladder incontinence: No Spinal tumors: No Cauda equina syndrome: No Compression fracture: No Abdominal aneurysm: No  COGNITION: 07/22/2022 Overall cognitive status: Within functional limits for tasks assessed     SENSATION: 07/22/2022 Not tested  MUSCLE LENGTH: 07/22/2022  Quads: moderate limitation B  Hamstrings: mild limitation R, severe limitation L  Piriformis: moderate limitation R, severe limitation L  Hip flexors moderate limitation B per observation   POSTURE:  7/16/2024rounded shoulders, forward head, decreased lumbar lordosis, increased thoracic kyphosis, and flexed trunk   PALPATION: 07/22/2022 TTP Rt lumbar area at L5 level  LUMBAR ROM:   AROM 07/22/2022 07/30/2022  Flexion Mild limitation, hamstrings tight    Extension WNL but pain R lumbar area  75 % WFL with end range pain Rt lumbar   Right lateral flexion WNL  WFL with complaints on Rt side   Left lateral flexion WNL    Right rotation Moderate limitation    Left rotation Moderate limitation     (Blank rows = not tested)    LOWER  EXTREMITY MMT:    MMT Right 07/22/2022 Left 07/22/2022 Right 08/05/2022 Left  08/05/2022  Hip flexion 4+ 4+ 5/5 5/5  Hip extension 4 4    Hip abduction 4+ 4+    Hip adduction      Hip internal rotation      Hip external rotation      Knee flexion 4+ 4+    Knee extension 4+ 4+ 5/5 5/5  Ankle dorsiflexion 4+ 4+    Ankle plantarflexion      Ankle inversion      Ankle eversion       (Blank rows = not tested)                   TODAY'S TREATMENT:                                                       DATE:  08/05/2022 Therex: Prone opposite arm/leg lift 3 second hold x 10 bilateral (cues for  home use)  Standing lumbar extension AROM x 5  Nustep lvl 6 UE/LE 10 mins   Manual Prone cPA L3-L5 g4 mobs.  Skilled palpation with DN  Trigger Point Dry-Needling  Treatment instructions: Expect mild to moderate muscle soreness. S/S of pneumothorax if dry needled over a lung field, and to seek immediate medical attention should they occur. Patient verbalized understanding of these instructions and education.  Patient Consent Given: Yes Education handout provided: Yes Muscles treated: Rt QL, inferior paraspinals lumbar Treatment response/outcome: Concordant twitch response. No adverse reaction noted.   TODAY'S TREATMENT:                                                       DATE:  07/30/2022 Therex: Review of existing HEP c cues for updates. Standing lumbar extension AROM x 5  Nustep lvl 6 UE/LE 8 mins   Manual Prone cPA L3-L5 g4 mobs.  Skilled palpation with DN  Trigger Point Dry-Needling  Treatment instructions: Expect mild to moderate muscle soreness. S/S of pneumothorax if dry needled over a lung field, and to seek immediate medical attention should they occur. Patient verbalized understanding of these instructions and education.  Patient Consent Given: Yes Education handout provided: Yes Muscles treated: Rt QL, inferior paraspinals lumbar Treatment response/outcome: Concordant  twitch response. No adverse reaction noted.    TODAY'S TREATMENT:                                                       DATE:  07/22/2022 Objective measures, appropriate education, care planning  TherEx   SciFit bike L4 x6 minutes PPT x10 with 3 second holds Figure 4 stretch 2x30 seconds B SKTC 5x5 seconds B      PATIENT EDUCATION:  08/05/2022 Education details: HEP update Person educated: Patient Education method: Explanation, Demonstration, and Handouts Education comprehension: verbalized understanding, returned demonstration, and needs further education  HOME EXERCISE PROGRAM: Access Code: [email or web address] URL: [email or web address] Date: 08/05/2022 Prepared by: Chyrel Masson  Exercises - Standing Lumbar Extension with Counter  - 3-5 x daily - 7 x weekly - 1 sets - 5-10 reps - Supine Lower Trunk Rotation  - 2 x daily - 7 x weekly - 1 sets - 5 reps - 5 hold - Supine Figure 4 Piriformis Stretch  - 2 x daily - 7 x weekly - 1 sets - 2 reps - 30 hold - Supine Single Knee to Chest Stretch  - 2 x daily - 7 x weekly - 1 sets - 5 reps - 5  seconds  hold - Prone Alternating Arm and Leg Lifts  - 1-2 x daily - 7 x weekly - 1-2 sets - 10 reps - 3 hold - Supine Bridge  - 1-2 x daily - 7 x weekly - 1-2 sets - 10 reps - 2 hold  ASSESSMENT:  CLINICAL IMPRESSION: Repeated dry needling and PAIVM intervention today due to benefits reported.  PAIVM assessment revealed an improvement in joint mobility noted in lower lumbar. Progressed HEP to include further strengthening program.   Shorter treatment time due to good knowledge of existing HEP.   Adjusted schedule going forward with reduced frequency due to overall  improvements and patient desire.   OBJECTIVE IMPAIRMENTS: decreased activity tolerance, decreased mobility, difficulty walking, decreased ROM, decreased strength, hypomobility, increased muscle spasms, impaired flexibility, improper body mechanics, postural dysfunction, obesity,  and pain.   ACTIVITY LIMITATIONS: carrying, lifting, bending, squatting, transfers, bed mobility, and locomotion level  PARTICIPATION LIMITATIONS: driving, shopping, community activity, yard work, and church  PERSONAL FACTORS: Age, Behavior pattern, Education, Fitness, Past/current experiences, Social background, and Time since onset of injury/illness/exacerbation are also affecting patient's functional outcome.   REHAB POTENTIAL: Good  CLINICAL DECISION MAKING: Stable/uncomplicated  EVALUATION COMPLEXITY: Low   GOALS: Goals reviewed with patient? Yes  SHORT TERM GOALS: Target date: 08/12/2022    Will be compliant with appropriate progressive HEP  Baseline: Goal status: Met 08/05/2022  2.  Lumbar ROM to be WNL all planes without increased pain Baseline:  Goal status: on going 07/30/2022  3.  LE flexibility to have improved by at least 50%  Baseline:  Goal status: on going 07/30/2022  4.  Will demonstrate correct functional biomechanics for bed mobility and floor to waist lifting  Baseline:  Goal status: on going 07/30/2022    LONG TERM GOALS: Target date: 09/02/2022    MMT to be 5/5 globally  Baseline:  Goal status: INITIAL  2.  Morning stiffness to have improved by 75%  Baseline:  Goal status: INITIAL  3.  Fatigue/increased back pain by the end of a busy day to have improved by 75%   Baseline:  Goal status: INITIAL  4.  Will be able to perform all daily tasks without increase from resting pain levels  Baseline:  Goal status: INITIAL  5.  FOTO score to be within 5 points of predicted by DC  Baseline:  Goal status: INITIAL    PLAN:  PT FREQUENCY: 2x/week  PT DURATION: 6 weeks  PLANNED INTERVENTIONS: Therapeutic exercises, Therapeutic activity, Patient/Family education, Self Care, Joint mobilization, Aquatic Therapy, Dry Needling, Electrical stimulation, Cryotherapy, Moist heat, Taping, Ultrasound, Ionotophoresis 4mg /ml Dexamethasone, Manual therapy, and  Re-evaluation.  PLAN FOR NEXT SESSION: FOTO reassessment   Chyrel Masson, PT, DPT, OCS, ATC 08/05/22  9:16 AM

## 2022-08-07 ENCOUNTER — Encounter: Payer: Medicare Other | Admitting: Rehabilitative and Restorative Service Providers"

## 2022-08-12 ENCOUNTER — Encounter: Payer: Medicare Other | Admitting: Rehabilitative and Restorative Service Providers"

## 2022-08-13 NOTE — Therapy (Addendum)
OUTPATIENT PHYSICAL THERAPY TREATMENT  / DISCHARGE   Patient Name: Brandon Frederick MRN: [id number] DOB:04-06-1955, 67 y.o., male Today's Date: 08/14/2022  END OF SESSION:  PT End of Session - 08/14/22 0759     Visit Number 4    Number of Visits 13    Date for PT Re-Evaluation 09/02/22    Authorization Type MCR and BCBS    Authorization Time Period 07/22/22 to 09/02/22    Progress Note Due on Visit 10    PT Start Time 0759    PT Stop Time 0822    PT Time Calculation (min) 23 min    Activity Tolerance Patient tolerated treatment well    Behavior During Therapy [facility] for tasks assessed/performed                Past Medical History:  Diagnosis Date   Arthritis    ASHD (arteriosclerotic heart disease)    stent   Dyslipidemia    History of stress test 09/2011   Which showed his previously documented scarring anteriorly and apically, concordant with his LAD infarction. Post-stress EF 40%   Hx of echocardiogram    showed an EF of approximately 40% with moderate-to-severe anterior wall hypokenisis and moderate-to-severe apical wall hypokinesis. He did have mild aortic sclerosis. There was mild pulmonary hypertensin with estimated RV systolic pressure of 34 mm.   Hyperlipidemia    Melanoma (HCC)    torso   MI (myocardial infarction) (HCC) 2004, 2009   Large Anterior wall, at which he underwent stenting of his promixal occluded LAD with ultimate insertion of a 3.5 x 28 mm Cypher stent post dilated to 4.1 cm.   Past Surgical History:  Procedure Laterality Date   CARDIAC CATHETERIZATION  06/2007    revealed a progressive disease in the mid LAD, and he underwent insertion of a new mid-LAD stents as well stenting of his PLA branch of his right coronary artery with a 2.75 x 30 mm Cypher stent post dilated 3.25 mm. He did also have some mild additional concomitant CAD.   COLONOSCOPY  2019   CORONARY ANGIOPLASTY WITH STENT PLACEMENT     CORONARY STENT INTERVENTION N/A 11/30/2017    Procedure: CORONARY STENT INTERVENTION;  Surgeon: Corky Crafts, MD;  Location: [facility] INVASIVE CV LAB;  Service: Cardiovascular;  Laterality: N/A;   DECOMPRESSIVE LUMBAR LAMINECTOMY LEVEL 2 N/A 12/02/2021   Procedure: L3-4 AND L4-5 LUMBAR LAMINECTOMY LEVEL 2;  Surgeon: London Sheer, MD;  Location: [facility] OR;  Service: Orthopedics;  Laterality: N/A;   FOOT FRACTURE SURGERY Right 2019   LEFT HEART CATH AND CORONARY ANGIOGRAPHY N/A 11/30/2017   Procedure: LEFT HEART CATH AND CORONARY ANGIOGRAPHY;  Surgeon: Corky Crafts, MD;  Location: [facility] INVASIVE CV LAB;  Service: Cardiovascular;  Laterality: N/A;   Patient Active Problem List   Diagnosis Date Noted   Spinal stenosis 12/02/2021   Benign prostatic hyperplasia without lower urinary tract symptoms 09/09/2017   Mild obesity 09/26/2014   Essential hypertension 02/26/2013   Hyperlipidemia with target LDL less than 70 08/23/2012   History of melanoma 07/28/2011   ASHD (arteriosclerotic heart disease) 07/28/2011    PCP: Sharlot Gowda MD   REFERRING PROVIDER: London Sheer, MD  REFERRING DIAG: M54.50 (ICD-10-CM) - Acute midline low back pain without sciatica  Rationale for Evaluation and Treatment: Rehabilitation  THERAPY DIAG:  Other low back pain  Abnormal posture  Muscle weakness (generalized)  ONSET DATE: surgery in November 2023  SUBJECTIVE:  SUBJECTIVE STATEMENT: Pt indicated feeling pretty good overall.  Reported still feeling some at the end of the day.   PERTINENT HISTORY:  OA, ASHD, HLD, melanoma, MI, cardiac cath, lumbar laminectomy, foot fx surgery 2019  PAIN:   NPRS scale: No specific pain number.  Pain location: Rt lumbar region  Pain description: tightness, pain at rest Aggravating factors: doing a lot, getting tired by  the end of the day Relieving factors: resting with back support, tylenol/advil   PRECAUTIONS: None  RED FLAGS: None   WEIGHT BEARING RESTRICTIONS: No  FALLS:  Has patient fallen in last 6 months? No, no FOF   LIVING ENVIRONMENT: Lives with: lives with their family Lives in: House/apartment Stairs: none besides a couple of steps into a sunken living room  Has following equipment at home: None  OCCUPATION: retired- used to work for [employer]   PLOF: Independent, Independent with basic ADLs, Independent with gait, and Independent with transfers  PATIENT GOALS: be more flexible, get rid of pain as much as possible   NEXT MD VISIT: Dr. Christell Constant PRN   OBJECTIVE:   PATIENT SURVEYS:  08/14/2022:  72  07/22/2022 FOTO 46, predicted 58 in 13 visits   SCREENING FOR RED FLAGS: 07/22/2022 Bowel or bladder incontinence: No Spinal tumors: No Cauda equina syndrome: No Compression fracture: No Abdominal aneurysm: No  COGNITION: 07/22/2022 Overall cognitive status: Within functional limits for tasks assessed     SENSATION: 07/22/2022 Not tested  MUSCLE LENGTH: 07/22/2022  Quads: moderate limitation B  Hamstrings: mild limitation R, severe limitation L  Piriformis: moderate limitation R, severe limitation L  Hip flexors moderate limitation B per observation   POSTURE:  7/16/2024rounded shoulders, forward head, decreased lumbar lordosis, increased thoracic kyphosis, and flexed trunk   PALPATION: 07/22/2022 TTP Rt lumbar area at L5 level  LUMBAR ROM:   AROM 07/22/2022 07/30/2022   Flexion Mild limitation, hamstrings tight     Extension WNL but pain R lumbar area  75 % WFL with end range pain Rt lumbar    Right lateral flexion WNL  WFL with complaints on Rt side    Left lateral flexion WNL     Right rotation Moderate limitation     Left rotation Moderate limitation      (Blank rows = not tested)    LOWER EXTREMITY MMT:    MMT Right 07/22/2022 Left 07/22/2022  Right 08/05/2022 Left  08/05/2022  Hip flexion 4+ 4+ 5/5 5/5  Hip extension 4 4    Hip abduction 4+ 4+    Hip adduction      Hip internal rotation      Hip external rotation      Knee flexion 4+ 4+    Knee extension 4+ 4+ 5/5 5/5  Ankle dorsiflexion 4+ 4+    Ankle plantarflexion      Ankle inversion      Ankle eversion       (Blank rows = not tested)                   TODAY'S TREATMENT:                                                       DATE:  08/14/2022 Therex: Verbal review of existing HEP c consistency cues and advice to continue use even as  symptoms improve.   Manual  Skilled palpation with DN  Trigger Point Dry-Needling  Treatment instructions: Expect mild to moderate muscle soreness. S/S of pneumothorax if dry needled over a lung field, and to seek immediate medical attention should they occur. Patient verbalized understanding of these instructions and education.  Patient Consent Given: Yes Education handout provided: Yes Muscles treated: Rt QL, inferior paraspinals lumbar Treatment response/outcome: Concordant twitch response. No adverse reaction noted.   TODAY'S TREATMENT:                                                       DATE:  08/05/2022 Therex: Prone opposite arm/leg lift 3 second hold x 10 bilateral (cues for home use)  Standing lumbar extension AROM x 5  Nustep lvl 6 UE/LE 10 mins   Manual Prone cPA L3-L5 g4 mobs.  Skilled palpation with DN  Trigger Point Dry-Needling  Treatment instructions: Expect mild to moderate muscle soreness. S/S of pneumothorax if dry needled over a lung field, and to seek immediate medical attention should they occur. Patient verbalized understanding of these instructions and education.  Patient Consent Given: Yes Education handout provided: Yes Muscles treated: Rt QL, inferior paraspinals lumbar Treatment response/outcome: Concordant twitch response. No adverse reaction noted.   TODAY'S TREATMENT:                                                        DATE:  07/30/2022 Therex: Review of existing HEP c cues for updates. Standing lumbar extension AROM x 5  Nustep lvl 6 UE/LE 8 mins   Manual Prone cPA L3-L5 g4 mobs.  Skilled palpation with DN  Trigger Point Dry-Needling  Treatment instructions: Expect mild to moderate muscle soreness. S/S of pneumothorax if dry needled over a lung field, and to seek immediate medical attention should they occur. Patient verbalized understanding of these instructions and education.  Patient Consent Given: Yes Education handout provided: Yes Muscles treated: Rt QL, inferior paraspinals lumbar Treatment response/outcome: Concordant twitch response. No adverse reaction noted.    TODAY'S TREATMENT:                                                       DATE:  07/22/2022 Objective measures, appropriate education, care planning  TherEx   SciFit bike L4 x6 minutes PPT x10 with 3 second holds Figure 4 stretch 2x30 seconds B SKTC 5x5 seconds B      PATIENT EDUCATION:  08/05/2022 Education details: HEP update Person educated: Patient Education method: Explanation, Demonstration, and Handouts Education comprehension: verbalized understanding, returned demonstration, and needs further education  HOME EXERCISE PROGRAM: Access Code: [email or web address] URL: [email or web address] Date: 08/05/2022 Prepared by: Chyrel Masson  Exercises - Standing Lumbar Extension with Counter  - 3-5 x daily - 7 x weekly - 1 sets - 5-10 reps - Supine Lower Trunk Rotation  - 2 x daily - 7 x weekly - 1 sets - 5 reps - 5 hold - Supine Figure 4 Piriformis Stretch  - 2  x daily - 7 x weekly - 1 sets - 2 reps - 30 hold - Supine Single Knee to Chest Stretch  - 2 x daily - 7 x weekly - 1 sets - 5 reps - 5  seconds  hold - Prone Alternating Arm and Leg Lifts  - 1-2 x daily - 7 x weekly - 1-2 sets - 10 reps - 3 hold - Supine Bridge  - 1-2 x daily - 7 x weekly - 1-2 sets - 10 reps - 2  hold  ASSESSMENT:  CLINICAL IMPRESSION: Pt has attended 4 visits overall during course of treatment showing good improvement and noted improvement FOTO.  Due to general improvements noted, recommended trial HEP for several weeks.  Pt was in agreement.   OBJECTIVE IMPAIRMENTS: decreased activity tolerance, decreased mobility, difficulty walking, decreased ROM, decreased strength, hypomobility, increased muscle spasms, impaired flexibility, improper body mechanics, postural dysfunction, obesity, and pain.   ACTIVITY LIMITATIONS: carrying, lifting, bending, squatting, transfers, bed mobility, and locomotion level  PARTICIPATION LIMITATIONS: driving, shopping, community activity, yard work, and church  PERSONAL FACTORS: Age, Behavior pattern, Education, Fitness, Past/current experiences, Social background, and Time since onset of injury/illness/exacerbation are also affecting patient's functional outcome.   REHAB POTENTIAL: Good  CLINICAL DECISION MAKING: Stable/uncomplicated  EVALUATION COMPLEXITY: Low   GOALS: Goals reviewed with patient? Yes  SHORT TERM GOALS: Target date: 08/12/2022    Will be compliant with appropriate progressive HEP  Baseline: Goal status: Met 08/05/2022  2.  Lumbar ROM to be WNL all planes without increased pain Baseline:  Goal status: Met   3.  LE flexibility to have improved by at least 50%  Baseline:  Goal status: partially met 08/14/2022  4.  Will demonstrate correct functional biomechanics for bed mobility and floor to waist lifting  Baseline:  Goal status: met 08/14/2022    LONG TERM GOALS: Target date: 09/02/2022    MMT to be 5/5 globally  Baseline:  Goal status: on going 08/14/2022  2.  Morning stiffness to have improved by 75%  Baseline:  Goal status: met 08/14/2022  3.  Fatigue/increased back pain by the end of a busy day to have improved by 75%   Baseline:  Goal status: met 08/14/2022  4.  Will be able to perform all daily tasks without  increase from resting pain levels  Baseline:  Goal status: mostly met 08/14/2022  5.  FOTO score to be within 5 points of predicted by DC  Baseline:  Goal status: met 08/14/2022    PLAN:  PT FREQUENCY: 2x/week  PT DURATION: 6 weeks  PLANNED INTERVENTIONS: Therapeutic exercises, Therapeutic activity, Patient/Family education, Self Care, Joint mobilization, Aquatic Therapy, Dry Needling, Electrical stimulation, Cryotherapy, Moist heat, Taping, Ultrasound, Ionotophoresis 4mg /ml Dexamethasone, Manual therapy, and Re-evaluation.  PLAN FOR NEXT SESSION: trial HEP.   Chyrel Masson, PT, DPT, OCS, ATC 08/14/22  8:25 AM   PHYSICAL THERAPY DISCHARGE SUMMARY  Visits from Start of Care: 4  Current functional level related to goals / functional outcomes: See note   Remaining deficits: See note   Education / Equipment: HEP  Patient goals were  mostly met . Patient is being discharged due to not returning since the last visit.  Chyrel Masson, PT, DPT, OCS, ATC 10/02/22  1:59 PM

## 2022-08-14 ENCOUNTER — Ambulatory Visit (INDEPENDENT_AMBULATORY_CARE_PROVIDER_SITE_OTHER): Payer: Medicare Other | Admitting: Rehabilitative and Restorative Service Providers"

## 2022-08-14 ENCOUNTER — Encounter: Payer: Self-pay | Admitting: Rehabilitative and Restorative Service Providers"

## 2022-08-14 DIAGNOSIS — M5459 Other low back pain: Secondary | ICD-10-CM | POA: Diagnosis not present

## 2022-08-14 DIAGNOSIS — R293 Abnormal posture: Secondary | ICD-10-CM | POA: Diagnosis not present

## 2022-08-14 DIAGNOSIS — M6281 Muscle weakness (generalized): Secondary | ICD-10-CM | POA: Diagnosis not present

## 2022-08-19 ENCOUNTER — Encounter: Payer: Medicare Other | Admitting: Rehabilitative and Restorative Service Providers"

## 2022-08-21 ENCOUNTER — Encounter: Payer: Medicare Other | Admitting: Rehabilitative and Restorative Service Providers"

## 2022-08-25 ENCOUNTER — Ambulatory Visit: Payer: Medicare Other | Admitting: Orthopedic Surgery

## 2022-08-25 ENCOUNTER — Other Ambulatory Visit: Payer: Self-pay

## 2022-08-25 DIAGNOSIS — M79641 Pain in right hand: Secondary | ICD-10-CM | POA: Diagnosis not present

## 2022-08-25 DIAGNOSIS — M65311 Trigger thumb, right thumb: Secondary | ICD-10-CM

## 2022-08-25 DIAGNOSIS — M65312 Trigger thumb, left thumb: Secondary | ICD-10-CM

## 2022-08-25 DIAGNOSIS — M79642 Pain in left hand: Secondary | ICD-10-CM

## 2022-08-25 MED ORDER — LIDOCAINE HCL 1 % IJ SOLN
1.0000 mL | INTRAMUSCULAR | Status: AC | PRN
  Administered 2022-08-25: 1 mL

## 2022-08-25 MED ORDER — BETAMETHASONE SOD PHOS & ACET 6 (3-3) MG/ML IJ SUSP
6.0000 mg | INTRAMUSCULAR | Status: AC | PRN
  Administered 2022-08-25: 6 mg via INTRA_ARTICULAR

## 2022-08-25 NOTE — Progress Notes (Signed)
Brandon Frederick - 67 y.o. male MRN [id number]  Date of birth: [birth year]  Office Visit Note: Visit Date: 08/25/2022 PCP: Ronnald Nian, MD Referred by: Ronnald Nian, MD  Subjective: Chief Complaint  Patient presents with   Right Hand - Pain   Left Hand - Pain   HPI: Brandon Frederick is a pleasant 67 y.o. male who presents today for evaluation of bilateral hand pain and stiffness, primarily at the volar base of bilateral thumbs.  States that the thumbs occasionally lock up and he feels a clicking sensation with deep flexion at the IP joint.  Has history of prior left long finger that used to lock up as well, has since resolved.  Denies any numbness or tingling.  Also does have ongoing soreness at the thumb basilar joints bilaterally.  Visit Reason: bilateral hand pain Hand dominance: right/ left Occupation: Curator  Diabetic: No Heart/Lung History: 3 heart attacks Blood Thinners: Plavix  Prior Testing/EMG: none Injections (Date):none Treatments: none Prior Surgery: none    *states hands feel stiff/ fingers "lock up"*  Pertinent ROS were reviewed with the patient and found to be negative unless otherwise specified above in HPI.   Assessment & Plan: Visit Diagnoses:  1. Pain in right hand   2. Pain in left hand   3. Trigger thumb, left thumb   4. Trigger thumb, right thumb     Plan: Extensive discussion was had with patient today regarding his bilateral hand complaints.  Clinically, he demonstrates bilateral thumb trigger digits, right worse than left.  He has palpable nodules at the A1 pulleys of bilateral thumbs.  Given that has not undergone any prior treatments, he is indicated for cortisone injections to bilateral thumb A1 pulleys for symptom relief.  We discussed the etiology and pathophysiology of trigger digit, in regards to the stenosing tenosynovitis within these regions.  Efficacy of the injection was discussed in detail as well, as well as the possibility for  symptom recurrence or persistence that may require surgical invention in the future.  Patient expressed full understanding, bilateral cortisone injections to bilateral trigger thumbs were administered today.  He also does demonstrate evidence of clinical and radiographic bilateral thumb CMC arthritis, this is mild/moderate in nature radiographically, his clinical examination does demonstrate pain particularly at the left thumb basilar joint with deep palpation and positive crepitus with grind.  We discussed potential treat modalities ranging from conservative to surgical for this.  For the time being, he will continue with activity modification and over-the-counter/topical medication as needed.  I will plan on seeing him back in approximate 6 weeks to track his progress from the injection for the bilateral trigger thumbs.  Follow-up: Return in about 6 weeks (around 10/06/2022).   Meds & Orders: No orders of the defined types were placed in this encounter.   Orders Placed This Encounter  Procedures   XR Hand Complete Right   XR Hand Complete Left     Procedures: Hand/UE Inj: R thumb A1 for trigger finger on 08/25/2022 10:33 AM Indications: pain Details: 25 G needle, volar approach Medications: 1 mL lidocaine 1 %; 6 mg betamethasone acetate-betamethasone sodium phosphate 6 (3-3) MG/ML Consent was given by the patient. Patient was prepped and draped in the usual sterile fashion.    Hand/UE Inj: L thumb A1 for trigger finger on 08/25/2022 10:34 AM Indications: pain Details: 25 G needle, volar approach Medications: 1 mL lidocaine 1 %; 6 mg betamethasone acetate-betamethasone sodium phosphate 6 (3-3) MG/ML  Consent was given by the patient. Patient was prepped and draped in the usual sterile fashion.          Clinical History: EXAM: MRI LUMBAR SPINE WITHOUT CONTRAST   TECHNIQUE: Multiplanar, multisequence MR imaging of the lumbar spine was performed. No intravenous contrast was  administered.   COMPARISON:  Radiography 07/15/2021   FINDINGS: Segmentation:  5 lumbar type vertebral bodies.   Alignment:  Normal   Vertebrae: No fracture or significant focal lesion. Insignificant minor endplate Schmorl's nodes at L3-4.   Conus medullaris and cauda equina: Conus extends to the L1 level. Conus and cauda equina appear normal.   Paraspinal and other soft tissues: Normal   Disc levels:   T12-L1: Normal   L1-2: Mild disc bulge.  No stenosis.   L2-3: Mild to moderate disc bulge. Mild facet and ligamentous hypertrophy. Mild narrowing of the lateral recesses left more than right but without likely neural compression.   L3-4: Moderate disc bulge. Mild facet and ligamentous hypertrophy. Mild narrowing of the lateral recesses but without likely neural compression.   L4-5: Broad-based disc herniation more prominent towards the right. Facet and ligamentous hypertrophy. Prominent dorsal epidural fat. Severe multifactorial stenosis at this level that could cause neural compression on either or both sides. Facet osteoarthritis could contribute to low back pain.   L5-S1: Chronic disc degeneration with loss of disc height. Endplate osteophytes and bulging of the disc more prominent towards the left. There is a small left posterolateral disc herniation with a small fragment migrated caudally to the left of midline adjacent to the left S1 nerve. There is some potential this could focally affect the left S1 nerve. L5 nerves exit without visible compression. Mild facet and ligamentous hypertrophy.   IMPRESSION: 1. L4-5: Broad-based disc herniation more prominent towards the right. Facet and ligamentous hypertrophy. Prominent dorsal epidural fat. Severe multifactorial stenosis at this level that could cause neural compression on either or both sides. Facet osteoarthritis could contribute additionally to low back pain. 2. L5-S1: Chronic disc degeneration with endplate  osteophytes and bulging of the disc more prominent towards the left. Small left posterolateral disc herniation with a small fragment migrated caudally adjacent to the left S1 nerve. Some potential this could focally affect the left S1 nerve. 3. L2-3 and L3-4: Disc bulges with mild facet and ligamentous hypertrophy. Mild narrowing of the lateral recesses but without likely neural compression.     Electronically Signed   By: Paulina Fusi M.D.   On: 10/07/2021 09:20  He reports that he has never smoked. He has never used smokeless tobacco.  Recent Labs    05/20/22 0904  HGBA1C 5.7*    Objective:   Vital Signs: There were no vitals taken for this visit.  Physical Exam  Gen: Well-appearing, in no acute distress; non-toxic CV: Regular Rate. Well-perfused. Warm.  Resp: Breathing unlabored on room air; no wheezing. Psych: Fluid speech in conversation; appropriate affect; normal thought process Neuro: Sensation intact throughout. No gross coordination deficits.   Ortho Exam PHYSICAL EXAM:  General: Patient is well appearing and in no distress. Cervical spine mobility is full in all directions:  Skin and Muscle: No skin changes are apparent to upper extremities.  Muscle bulk and contour normal, no signs of atrophy.     Range of Motion and Palpation Tests: Mobility is full about the elbows with flexion and extension.  Forearm supination and pronation are 85/85 bilaterally.  Wrist flexion/extension is 75/65 bilaterally.  Digital flexion and extension are  full.  Thumb opposition is full to the base of the small fingers bilaterally.    Palpable nodules at bilateral thumb A1 pulleys, associated tenderness.  Notable triggering to bilateral thumbs as well with thumb IP flexion.    Moderate tenderness over the thumb CMC articulations bilaterally is observed.  Scaphoid shift test is negative.  Finklestein test is negative.  Ulnar impingement test is negative.  No evidence of radiocarpal,  midcarpal or intercarpal joint instability with provocation.  Neurologic, Vascular, Motor: Sensation is intact to light touch in the median/radial/ulnar distributions.   Fingers pink and well perfused.  Capillary refill is brisk.      Lab Results  Component Value Date   HGBA1C 5.7 (A) 05/20/2022     Imaging: No results found.  Past Medical/Family/Surgical/Social History: Medications & Allergies reviewed per EMR, new medications updated. Patient Active Problem List   Diagnosis Date Noted   Spinal stenosis 12/02/2021   Benign prostatic hyperplasia without lower urinary tract symptoms 09/09/2017   Mild obesity 09/26/2014   Essential hypertension 02/26/2013   Hyperlipidemia with target LDL less than 70 08/23/2012   History of melanoma 07/28/2011   ASHD (arteriosclerotic heart disease) 07/28/2011   Past Medical History:  Diagnosis Date   Arthritis    ASHD (arteriosclerotic heart disease)    stent   Dyslipidemia    History of stress test 09/2011   Which showed his previously documented scarring anteriorly and apically, concordant with his LAD infarction. Post-stress EF 40%   Hx of echocardiogram    showed an EF of approximately 40% with moderate-to-severe anterior wall hypokenisis and moderate-to-severe apical wall hypokinesis. He did have mild aortic sclerosis. There was mild pulmonary hypertensin with estimated RV systolic pressure of 34 mm.   Hyperlipidemia    Melanoma (HCC)    torso   MI (myocardial infarction) (HCC) 2004, 2009   Large Anterior wall, at which he underwent stenting of his promixal occluded LAD with ultimate insertion of a 3.5 x 28 mm Cypher stent post dilated to 4.1 cm.   Family History  Problem Relation Age of Onset   Heart disease Father        5 way bypass   Colon cancer Maternal Grandfather        dx in his 67's   Crohn's disease Brother    Past Surgical History:  Procedure Laterality Date   CARDIAC CATHETERIZATION  06/2007    revealed a  progressive disease in the mid LAD, and he underwent insertion of a new mid-LAD stents as well stenting of his PLA branch of his right coronary artery with a 2.75 x 30 mm Cypher stent post dilated 3.25 mm. He did also have some mild additional concomitant CAD.   COLONOSCOPY  2019   CORONARY ANGIOPLASTY WITH STENT PLACEMENT     CORONARY STENT INTERVENTION N/A 11/30/2017   Procedure: CORONARY STENT INTERVENTION;  Surgeon: Corky Crafts, MD;  Location: [facility] INVASIVE CV LAB;  Service: Cardiovascular;  Laterality: N/A;   DECOMPRESSIVE LUMBAR LAMINECTOMY LEVEL 2 N/A 12/02/2021   Procedure: L3-4 AND L4-5 LUMBAR LAMINECTOMY LEVEL 2;  Surgeon: London Sheer, MD;  Location: [facility] OR;  Service: Orthopedics;  Laterality: N/A;   FOOT FRACTURE SURGERY Right 2019   LEFT HEART CATH AND CORONARY ANGIOGRAPHY N/A 11/30/2017   Procedure: LEFT HEART CATH AND CORONARY ANGIOGRAPHY;  Surgeon: Corky Crafts, MD;  Location: [facility] INVASIVE CV LAB;  Service: Cardiovascular;  Laterality: N/A;   Social History   Occupational History  Occupation: retired [employer]  Tobacco Use   Smoking status: Never   Smokeless tobacco: Never  Vaping Use   Vaping status: Never Used  Substance and Sexual Activity   Alcohol use: Yes    Alcohol/week: 0.0 standard drinks of alcohol    Comment: occ.   Drug use: No   Sexual activity: Yes    Aundra Pung Fara Boros) Jadis Mika, M.D. [facility] 10:31 AM

## 2022-08-26 ENCOUNTER — Encounter: Payer: Medicare Other | Admitting: Rehabilitative and Restorative Service Providers"

## 2022-08-28 ENCOUNTER — Encounter: Payer: Medicare Other | Admitting: Rehabilitative and Restorative Service Providers"

## 2022-08-29 ENCOUNTER — Other Ambulatory Visit (HOSPITAL_COMMUNITY): Payer: Self-pay

## 2022-09-03 ENCOUNTER — Encounter: Payer: Medicare Other | Admitting: Rehabilitative and Restorative Service Providers"

## 2022-09-04 ENCOUNTER — Encounter: Payer: Medicare Other | Admitting: Rehabilitative and Restorative Service Providers"

## 2022-09-15 ENCOUNTER — Encounter: Payer: Self-pay | Admitting: Family Medicine

## 2022-09-28 ENCOUNTER — Other Ambulatory Visit: Payer: Self-pay | Admitting: Medical

## 2022-09-28 DIAGNOSIS — N529 Male erectile dysfunction, unspecified: Secondary | ICD-10-CM

## 2022-10-06 ENCOUNTER — Ambulatory Visit: Payer: Medicare Other | Admitting: Orthopedic Surgery

## 2022-10-07 ENCOUNTER — Ambulatory Visit: Payer: Medicare Other | Attending: Cardiovascular Disease | Admitting: Cardiovascular Disease

## 2022-10-07 ENCOUNTER — Encounter: Payer: Self-pay | Admitting: Cardiovascular Disease

## 2022-10-07 DIAGNOSIS — I2102 ST elevation (STEMI) myocardial infarction involving left anterior descending coronary artery: Secondary | ICD-10-CM | POA: Diagnosis not present

## 2022-10-07 DIAGNOSIS — E669 Obesity, unspecified: Secondary | ICD-10-CM

## 2022-10-07 DIAGNOSIS — I10 Essential (primary) hypertension: Secondary | ICD-10-CM | POA: Diagnosis present

## 2022-10-07 DIAGNOSIS — I2510 Atherosclerotic heart disease of native coronary artery without angina pectoris: Secondary | ICD-10-CM

## 2022-10-07 DIAGNOSIS — Z9861 Coronary angioplasty status: Secondary | ICD-10-CM

## 2022-10-07 DIAGNOSIS — E785 Hyperlipidemia, unspecified: Secondary | ICD-10-CM

## 2022-10-07 NOTE — Patient Instructions (Signed)
Medication Instructions:  No medication changes *If you need a refill on your cardiac medications before your next appointment, please call your pharmacy*   Lab Work: [facility] If you have labs (blood work) drawn today and your tests are completely normal, you will receive your results only by: MyChart Message (if you have MyChart) OR A paper copy in the mail If you have any lab test that is abnormal or we need to change your treatment, we will call you to review the results.   Testing/Procedures: None ordered   Follow-Up: At [facility], you and your health needs are our priority.  As part of our continuing mission to provide you with exceptional heart care, we have created designated Provider Care Teams.  These Care Teams include your primary Cardiologist (physician) and Advanced Practice Providers (APPs -  Physician Assistants and Nurse Practitioners) who all work together to provide you with the care you need, when you need it.  We recommend signing up for the patient portal called "MyChart".  Sign up information is provided on this After Visit Summary.  MyChart is used to connect with patients for Virtual Visits (Telemedicine).  Patients are able to view lab/test results, encounter notes, upcoming appointments, etc.  Non-urgent messages can be sent to your provider as well.   To learn more about what you can do with MyChart, go to [email or web address].    Your next appointment:   1 year(s)  Provider:   Joni Reining, DNP, ANP

## 2022-10-07 NOTE — Progress Notes (Signed)
Patient ID: Brandon Frederick, male   DOB: 25-Jul-1955, 67 y.o.   MRN: [id number]       HPI: Brandon Frederick is a 67 y.o. male who presents to the office for a 3 year cardiology evaluation.  [name]  suffered a large anterior wall myocardial infarction in June 2004 due to total occlusion of his proximal LAD. He underwent successful stenting of his proximal LAD with a 3.5x20 mm Cypher stent postdilated to 4.65mm.  In June 2009 he was found to have progressive disease in the mid LAD and an additional 2.75x18 mm Cypher stent was placed, postdilated to 3.35 to 3.25 taper. In addition, a 2.75x13 mm stent was placed in the PLA branch of his RCA postdilated 3.25 mm. Over the last several years he has remained stable without recurrent chest pain. His last laboratory in March 2014 showed excellent cholesterol at 133 triglycerides 132 HDL 54 LDL 53. An echo Doppler study in 2011 showed an ejection fraction of 40% with mild aortic valve sclerosis with anterior apical moderate to severe hypokinesis. His last nuclear perfusion study in September 2013 demonstrated distal apical anterior scar without ischemia.   I last saw him in November 2018.  At that time he had remained stable since his prior to evaluation.  He specifically denied any exertional chest pain, PND, orthopnea, or palpitations.  He has continued to be on aspirin and Plavix for dual antiplatelet therapy.  He is on ramipril 2.5 mg, Toprol-XL 25 mg for his CAD and hypertension.  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  He has continued to be on atorvastatin 40 mg daily for hyperlipidemia, and Proscar 5 mg daily.  At that time I discussed potential weight loss with his BMI of 32.5.  Over the past several years, [name] apparently has been seen by several of the APP providers.  He developed recurrent chest pain in November 2019.  Repeat cardiac catheterization performed by Dr. Manuella Ghazi  showed patent LAD stents.  There was a 95% first diagonal stenosis and a branch of the diagonal was occluded with left to left collaterals.  There was a 75% ostial to proximal circumflex stenosis and he underwent successful insertion of a Synergy DES 3.0 x 16 mm stent to the circumflex vessel.  Was recommended that he continue long-term clopidogrel due to the long area of Cypher stents in his LAD.  An echo Doppler study during that hospitalization showed an EF at 45 to 50% with previously noted akinesis in the apical inferior and apical wall.  There was grade 2 diastolic dysfunction.  Aortic root dimension measured 38 mm.  The left atrium was moderately dilated.  Over the past several years, [name] has remained stable.  He denies recurrent anginal symptomatology.  He continues to be on atorvastatin 80 mg for hyperlipidemia.  He is on aspirin and Plavix without bleeding.  He continues to be on ramipril 2.5 mg twice a day metoprolol succinate 25 mg daily. He presents for evaluation.   Past Medical History:  Diagnosis Date   Arthritis    ASHD (arteriosclerotic heart disease)    stent   Dyslipidemia    History of stress test 09/2011   Which showed his previously documented scarring anteriorly and apically, concordant with his LAD infarction. Post-stress EF 40%   Hx of echocardiogram    showed an EF of approximately 40% with moderate-to-severe anterior wall hypokenisis and  moderate-to-severe apical wall hypokinesis. He did have mild aortic sclerosis. There was mild pulmonary hypertensin with estimated RV systolic pressure of 34 mm.   Hyperlipidemia    Melanoma (HCC)    torso   MI (myocardial infarction) (HCC) 2004, 2009   Large Anterior wall, at which he underwent stenting of his promixal occluded LAD with ultimate insertion of a 3.5 x 28 mm Cypher stent post dilated to 4.1 cm.    Past Surgical History:  Procedure Laterality Date   CARDIAC CATHETERIZATION  06/2007    revealed a progressive  disease in the mid LAD, and he underwent insertion of a new mid-LAD stents as well stenting of his PLA branch of his right coronary artery with a 2.75 x 30 mm Cypher stent post dilated 3.25 mm. He did also have some mild additional concomitant CAD.   COLONOSCOPY  2019   CORONARY ANGIOPLASTY WITH STENT PLACEMENT     CORONARY STENT INTERVENTION N/A 11/30/2017   Procedure: CORONARY STENT INTERVENTION;  Surgeon: Corky Crafts, MD;  Location: [facility] INVASIVE CV LAB;  Service: Cardiovascular;  Laterality: N/A;   DECOMPRESSIVE LUMBAR LAMINECTOMY LEVEL 2 N/A 12/02/2021   Procedure: L3-4 AND L4-5 LUMBAR LAMINECTOMY LEVEL 2;  Surgeon: London Sheer, MD;  Location: [facility] OR;  Service: Orthopedics;  Laterality: N/A;   FOOT FRACTURE SURGERY Right 2019   LEFT HEART CATH AND CORONARY ANGIOGRAPHY N/A 11/30/2017   Procedure: LEFT HEART CATH AND CORONARY ANGIOGRAPHY;  Surgeon: Corky Crafts, MD;  Location: [facility] CV LAB;  Service: Cardiovascular;  Laterality: N/A;    Allergies  Allergen Reactions   Dilaudid [Hydromorphone]     Patient states doesn't work for him   Hydrocodone-Acetaminophen     Patient states doesn't work for him  **Can only take Percocet for pain**    Current Outpatient Medications  Medication Sig Dispense Refill   aspirin 81 MG tablet Take 1 tablet (81 mg total) by mouth daily. 30 tablet    atorvastatin (LIPITOR) 80 MG tablet Take 1 tablet (80 mg total) by mouth daily. 90 tablet 1   Cholecalciferol (VITAMIN D3) 125 MCG (5000 UT) TABS Take 5,000 Units by mouth in the morning.     clopidogrel (PLAVIX) 75 MG tablet TAKE 1 TABLET DAILY 90 tablet 1   Coenzyme Q10-Vitamin E (QUNOL ULTRA COQ10 PO) Take 1 capsule by mouth every Monday, Wednesday, and Friday.     finasteride (PROSCAR) 5 MG tablet Take 1 tablet (5 mg total) by mouth daily. 90 tablet 1   loratadine (CLARITIN REDITABS) 10 MG dissolvable tablet Take 10 mg by mouth daily as needed for allergies.     metoprolol  succinate (TOPROL-XL) 25 MG 24 hr tablet Take 1 tablet (25 mg total) by mouth daily. 90 tablet 1   nitroGLYCERIN (NITROSTAT) 0.4 MG SL tablet PLACE 1 TABLET UNDER THE   TONGUE AND ALLOW TO        DISSOLVE EVERY 5 MINUTES ASNEEDED FOR CHEST PAIN 25 tablet 3   ramipril (ALTACE) 2.5 MG capsule TAKE 1 CAPSULE TWICE DAILY 180 capsule 1   tadalafil (CIALIS) 20 MG tablet TAKE ONE TABLET BY MOUTH DAILY AS NEEDED FOR ERECTILE DYSFUNCTION 30 tablet 0   No current facility-administered medications for this visit.    Social History   Socioeconomic History   Marital status: Married    Spouse name: Not on file   Number of children: 0   Years of education: Not on file   Highest education level: Not on  file  Occupational History   Occupation: retired [employer]  Tobacco Use   Smoking status: Never   Smokeless tobacco: Never  Vaping Use   Vaping status: Never Used  Substance and Sexual Activity   Alcohol use: Yes    Alcohol/week: 0.0 standard drinks of alcohol    Comment: occ.   Drug use: No   Sexual activity: Yes  Other Topics Concern   Not on file  Social History Narrative   Not on file   Social Determinants of Health   Financial Resource Strain: Low Risk  (05/03/2021)   Overall Financial Resource Strain (CARDIA)    Difficulty of Paying Living Expenses: Not hard at all  Food Insecurity: No Food Insecurity (05/03/2021)   Hunger Vital Sign    Worried About Running Out of Food in the Last Year: Never true    Ran Out of Food in the Last Year: Never true  Transportation Needs: No Transportation Needs (05/03/2021)   PRAPARE - Administrator, Civil Service (Medical): No    Lack of Transportation (Non-Medical): No  Physical Activity: Inactive (05/03/2021)   Exercise Vital Sign    Days of Exercise per Week: 0 days    Minutes of Exercise per Session: 0 min  Stress: No Stress Concern Present (05/03/2021)   [employer] of Occupational Health - Occupational Stress Questionnaire     Feeling of Stress : Not at all  Social Connections: Not on file  Intimate Partner Violence: Not on file   Socially he is married with no children. There is no tobacco use. He does drink occasional alcohol. He recently bought a home in [place] has been spending time at [employer].  He continues to work on [employer] cars.  Family history is notable that both parents are alive, mother age 53 father age 49. He is a brother age 53 and a sister age 73. Maternal grandmother suffered an MI and died at 51  ROS General: Negative; No fevers, chills, or night sweats;  HEENT: Negative; No changes in vision or hearing, sinus congestion, difficulty swallowing Pulmonary: Negative; No cough, wheezing, shortness of breath, hemoptysis Cardiovascular: Negative; No chest pain, presyncope, syncope, palpitations GI: Negative; No nausea, vomiting, diarrhea, or abdominal pain GU: Negative; No dysuria, hematuria, or difficulty voiding Musculoskeletal: Negative; no myalgias, joint pain, or weakness Hematologic/Oncology: Negative; no easy bruising, bleeding Endocrine: Negative; no heat/cold intolerance; no diabetes Neuro: Transient right lateral thigh paresthesias; no changes in balance, headaches Skin: Negative; No rashes or skin lesions Psychiatric: Negative; No behavioral problems, depression Sleep: Negative; No snoring, daytime sleepiness, hypersomnolence, bruxism, restless legs, hypnogognic hallucinations, no cataplexy Other comprehensive 14 point system review is negative.   PE BP 124/86   Pulse 68   Ht 6\' 2"  (1.88 m)   Wt 256 lb 9.6 oz (116.4 kg)   SpO2 93%   BMI 32.95 kg/m    Repeat blood pressure by me 124/78  Wt Readings from Last 3 Encounters:  10/07/22 256 lb 9.6 oz (116.4 kg)  05/20/22 254 lb 12.8 oz (115.6 kg)  12/02/21 250 lb (113.4 kg)  .   Physical Exam BP 124/86   Pulse 68   Ht 6\' 2"  (1.88 m)   Wt 256 lb 9.6 oz (116.4 kg)   SpO2 93%   BMI 32.95 kg/m  General: Alert,  oriented, no distress.  Skin: normal turgor, no rashes, warm and dry; tanned HEENT: Normocephalic, atraumatic. Pupils equal round and reactive to light; sclera anicteric; extraocular muscles intact;  Nose without nasal septal hypertrophy Mouth/Parynx benign; Mallinpatti scale Neck: No JVD, no carotid bruits; normal carotid upstroke Lungs: clear to ausculatation and percussion; no wheezing or rales Chest wall: without tenderness to palpitation Heart: PMI not displaced, RRR, s1 s2 normal, 1/6 systolic murmur, no diastolic murmur, no rubs, gallops, thrills, or heaves Abdomen: soft, nontender; no hepatosplenomehaly, BS+; abdominal aorta nontender and not dilated by palpation. Back: no CVA tenderness Pulses 2+ Musculoskeletal: full range of motion, normal strength, no joint deformities Extremities: no clubbing cyanosis or edema, Homan's sign negative  Neurologic: grossly nonfocal; Cranial nerves grossly wnl Psychologic: Normal mood and affect     EKG Interpretation Date/Time:  Tuesday October 07 2022 16:31:02 EDT Ventricular Rate:  68 PR Interval:  250 QRS Duration:  100 QT Interval:  386 QTC Calculation: 410 R Axis:   -29  Text Interpretation: Sinus rhythm with 1st degree A-V block Incomplete right bundle branch block Cannot rule out Inferior infarct , age undetermined Anteroseptal infarct (cited on or before 30-Nov-2017) T wave abnormality, consider lateral ischemia When compared with ECG of 01-Dec-2017 05:04, Incomplete right bundle branch block is now Present Questionable change in initial forces of Septal leads Questionable change in initial forces of Lateral leads Confirmed by Nicki Guadalajara ([id number]) on 10/07/2022 5:40:21 PM    December 05, 2019 ECG (independently read by me): Sinus bradycardia at 55,1st degree block, PR 216 msec, QS V1-4 unchanged c/w prior Anterior MI  November 2018 ECG (independently read by me): Normal sinus rhythm at 65 bpm.  First degree AV block.  QTc interval  426 ms.  PR interval 216 ms.  QS complex V1 through V3 consistent with prior anteroseptal MI.  November 2017 ECG (independently read by me): Sinus bradycardia with first-degree AV block.  Ventricular rate 57 bpm.  PR interval 214 ms.  QS complex consistent with prior anteroseptal MI.  September 2016 ECG (independently read by me): , sinus bradycardia 54 bpm.  Anterior Q waves V1 through V4 concordant with his old anterior MI.  Normal intervals.    August 2015 ECG (independently read by me): Sinus rhythm at 64 beats per minute.  Old anterior wall myocardial infarction.  QTc interval 429 ms. ,  Nonspecific T. change  02/24/2013 ECG (independently read by me): Sinus rhythm at 60 beats per minute. Anterior Q waves V1 through V4 compatible with his prior anterior wall myocardial infarction. QTc interval 470.  Prior ECG of 08/23/2012: Sinus rhythm at 66 beats per minute. Perineural 184 ms. Poor anterograde progression compatible with his old injury or myocardial infarction.  LABS:    Latest Ref Rng & Units 05/20/2022    9:12 AM 12/03/2021    6:02 AM 11/21/2021    2:33 PM  BMP  Glucose 70 - 99 mg/dL 604  540  981   BUN 8 - 27 mg/dL 13  16  21    Creatinine 0.76 - 1.27 mg/dL 1.91  4.78  2.95   BUN/Creat Ratio 10 - 24 12     Sodium 134 - 144 mmol/L 140  133  141   Potassium 3.5 - 5.2 mmol/L 4.4  4.6  4.1   Chloride 96 - 106 mmol/L 104  102  111   CO2 20 - 29 mmol/L 22  24  24    Calcium 8.6 - 10.2 mg/dL 9.5  8.7  9.3       Latest Ref Rng & Units 05/20/2022    9:12 AM 05/08/2021   10:52 AM 04/27/2020  1:25 PM  Hepatic Function  Total Protein 6.0 - 8.5 g/dL 6.8  7.3  7.2   Albumin 3.9 - 4.9 g/dL 4.6  4.9  4.7   AST 0 - 40 IU/L 31  25  24    ALT 0 - 44 IU/L 34  33  32   Alk Phosphatase 44 - 121 IU/L 99  73  92   Total Bilirubin 0.0 - 1.2 mg/dL 0.9  1.0  1.1       Latest Ref Rng & Units 05/20/2022    9:12 AM 12/03/2021    6:02 AM 11/21/2021    2:33 PM  CBC  WBC 3.4 - 10.8 x10E3/uL 6.7   13.9  8.4   Hemoglobin 13.0 - 17.7 g/dL 29.5  62.1  30.8   Hematocrit 37.5 - 51.0 % 47.2  40.5  46.8   Platelets 150 - 450 x10E3/uL 155  177  163    Lab Results  Component Value Date   MCV 92 05/20/2022   MCV 95.1 12/03/2021   MCV 95.9 11/21/2021   Lab Results  Component Value Date   TSH 3.199 11/30/2017   Lab Results  Component Value Date   HGBA1C 5.7 (A) 05/20/2022    Lipid Panel     Component Value Date/Time   CHOL 139 05/20/2022 0912   TRIG 183 (H) 05/20/2022 0912   HDL 50 05/20/2022 0912   CHOLHDL 2.8 05/20/2022 0912   CHOLHDL 3.6 12/01/2017 0013   VLDL 36 12/01/2017 0013   LDLCALC 59 05/20/2022 0912   RADIOLOGY: No results found.  IMPRESSION: 1. ST elevation myocardial infarction involving left anterior descending (LAD) coronary artery [facility]): June 2004   2. Coronary artery disease involving native coronary artery of native heart without angina pectoris   3. History of percutaneous coronary intervention: 2004,2009, 2019   4. Essential hypertension   5. Hyperlipidemia with target LDL less than 70   6. Mild obesity    ASSESSMENT AND PLAN: Mr. Fluellen is a 67 year old white male who suffered a large anterior wall myocardial infarction in June 2004 at which time he underwent stenting of his proximal occluded LAD. In June 2009 a new LAD stent was inserted beyond the initially placed stent and he also underwent stenting of his PLA branch.  He developed recurrent chest pain and underwent repeat catheterization on November 30, 2017 and stenting of his very proximal left circumflex vessel.  There was evidence for left to left collateralization to his diagonal vessel and his LAD stent was widely patent.  At his last evaluation with me in November 2021 he remained asymptomatic and continued to be on long-term DAPT, metoprolol succinate 25 mg in addition to ramipril 5 mg daily.  LDL was 74.  Over the last several years he has continued to do well.  He sees Dr. Sharlot Gowda for  primary care.  Most recent laboratory done on May 20, 2022 showed total cholesterol 139, HDL 50, LDL 59 and triglycerides were mildly elevated at 183.  Hemoglobin A1c was 5.7.  Potassium was 4.4.  Clinically he continues to do well and remains asymptomatic without chest pain or exertional dyspnea.  His blood pressure today is stable.  He has first-degree AV block and incomplete right bundle branch block.  I have recommended that we check a lipoprotein a which is an independent marker of potential risk for future coronary events with increased risk for vulnerable plaque.  If his LP(a) is elevated, I would suggest adding Zetia to his  current atorvastatin 80 mg for even more aggressive lipid-lowering therapy with target LDL less than 50.  Clinically he is stable and will be following up with Dr. Susann Givens.  As long as he is stable I have recommended follow-up Cardiologic evaluation in 1 year.   Lennette Bihari, MD, [facility]  10/07/2022 6:45 PM

## 2022-10-09 ENCOUNTER — Other Ambulatory Visit: Payer: Self-pay | Admitting: Medical

## 2022-10-09 DIAGNOSIS — N529 Male erectile dysfunction, unspecified: Secondary | ICD-10-CM

## 2022-10-12 ENCOUNTER — Other Ambulatory Visit: Payer: Self-pay | Admitting: Medical

## 2022-10-12 DIAGNOSIS — N529 Male erectile dysfunction, unspecified: Secondary | ICD-10-CM

## 2022-10-29 ENCOUNTER — Other Ambulatory Visit: Payer: Self-pay | Admitting: Family Medicine

## 2022-10-29 DIAGNOSIS — I2510 Atherosclerotic heart disease of native coronary artery without angina pectoris: Secondary | ICD-10-CM

## 2022-10-29 DIAGNOSIS — I10 Essential (primary) hypertension: Secondary | ICD-10-CM

## 2022-10-29 DIAGNOSIS — E785 Hyperlipidemia, unspecified: Secondary | ICD-10-CM

## 2022-10-29 DIAGNOSIS — N400 Enlarged prostate without lower urinary tract symptoms: Secondary | ICD-10-CM

## 2023-01-14 ENCOUNTER — Other Ambulatory Visit: Payer: Self-pay | Admitting: Family Medicine

## 2023-01-14 DIAGNOSIS — I10 Essential (primary) hypertension: Secondary | ICD-10-CM

## 2023-01-14 DIAGNOSIS — I2510 Atherosclerotic heart disease of native coronary artery without angina pectoris: Secondary | ICD-10-CM

## 2023-01-22 IMAGING — CR DG FOOT COMPLETE 3+V*L*
3 series · 3 of 3 positions shown · non-contrast
Comparison: X-ray left foot 08/04/2017

CLINICAL DATA: Injured left foot 1 month ago still having mid fifth
metatarsal pain

EXAM:
LEFT FOOT - COMPLETE 3+ VIEW

[t foot ap left]
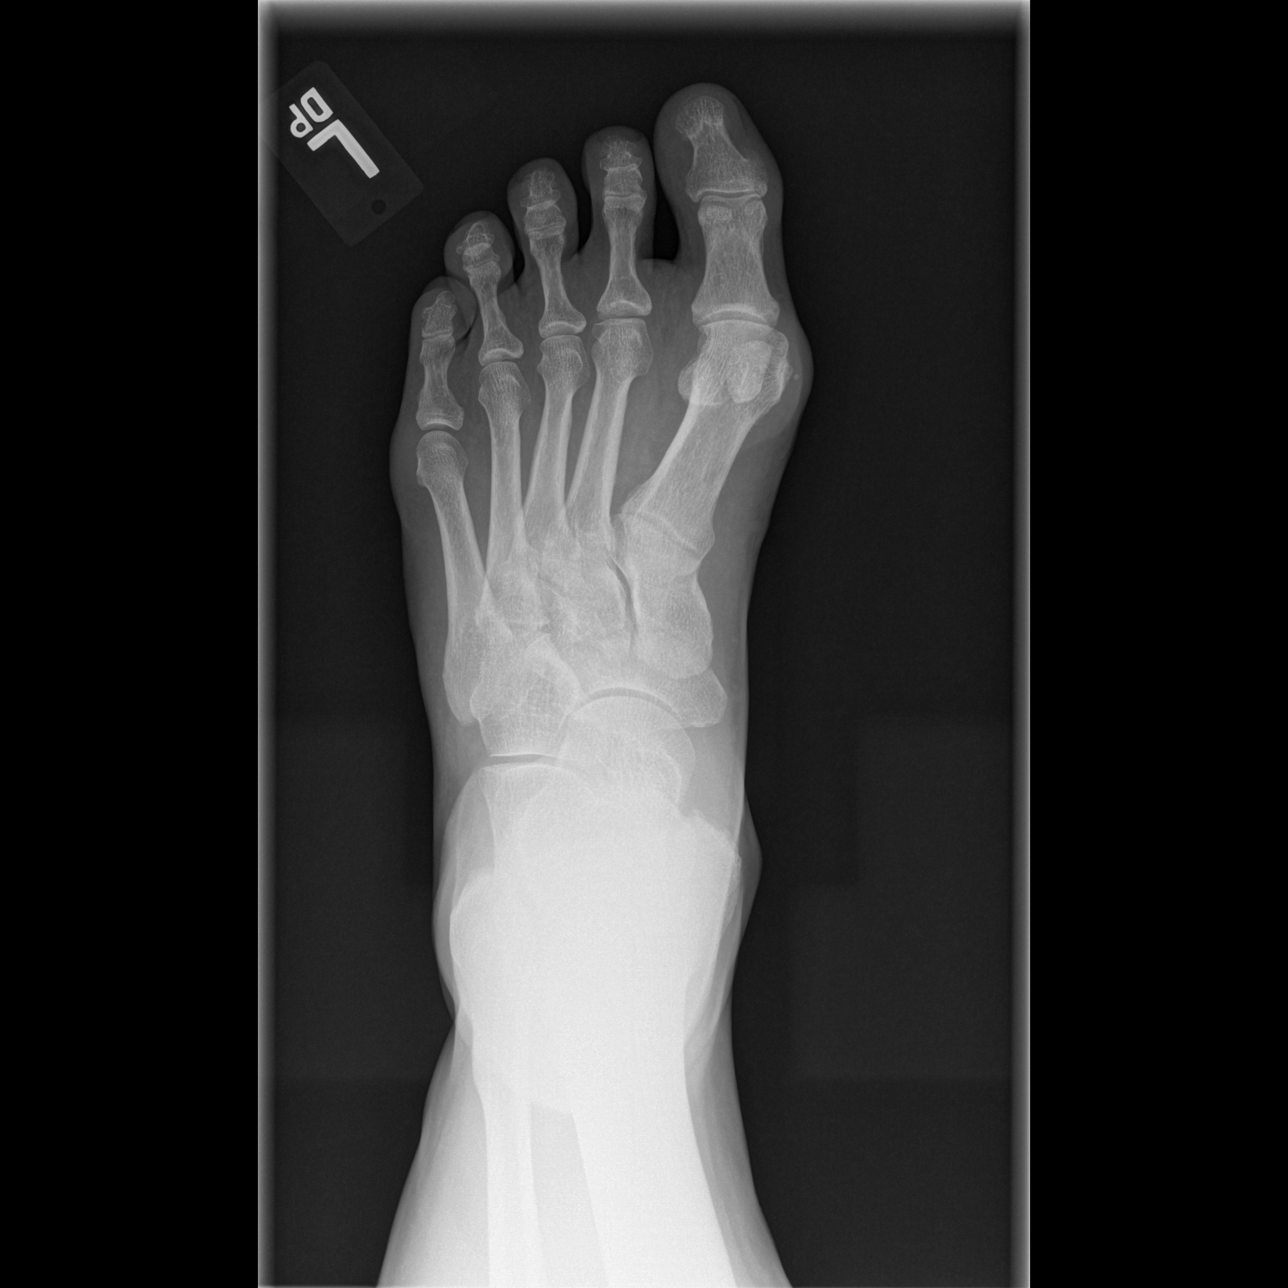

[t foot oblique left]
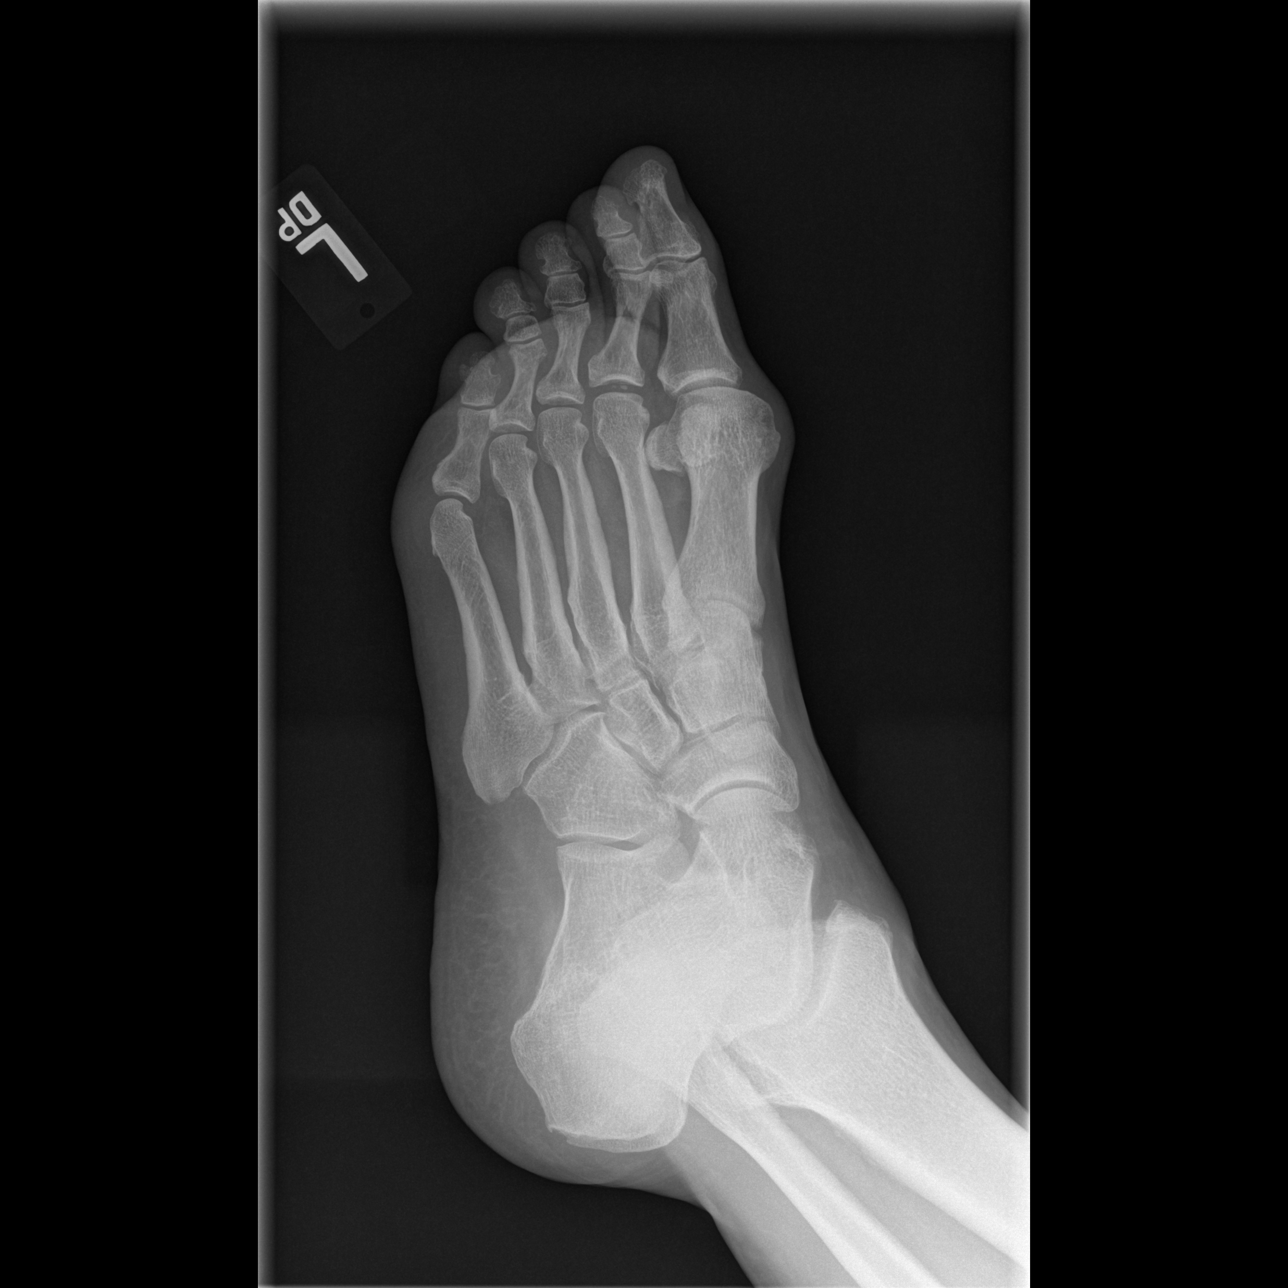

[t foot lat left]
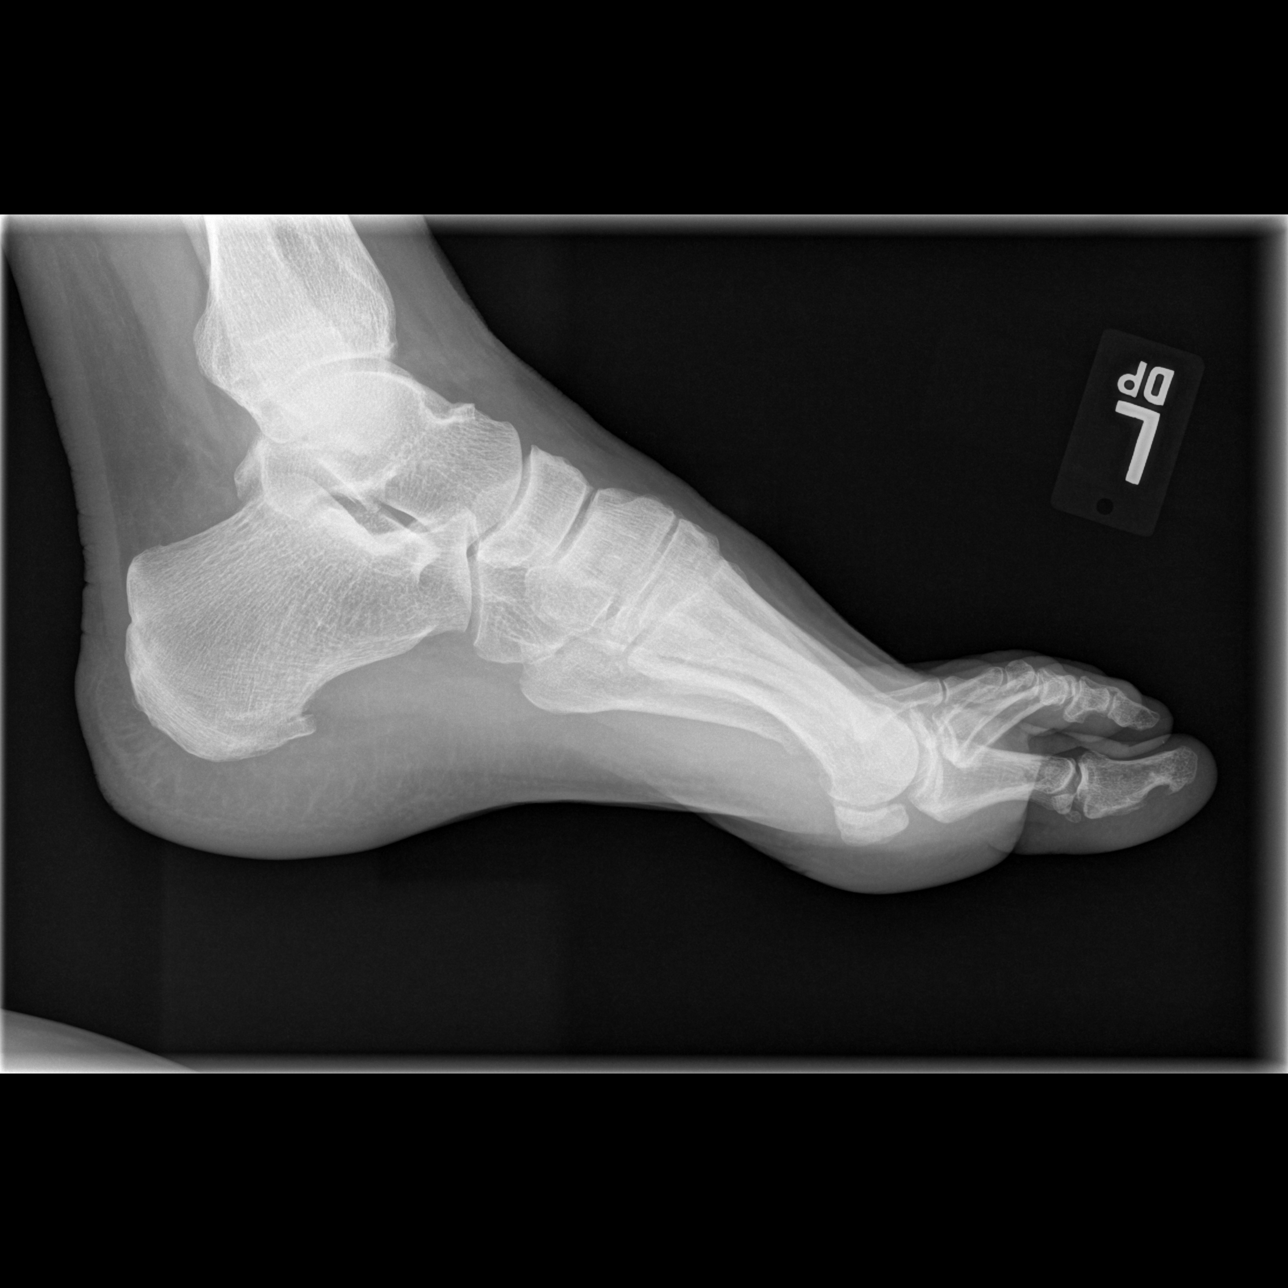

[3 of 3 positions shown; findings below may reference images not displayed]

FINDINGS: There is no evidence of fracture or dislocation. There is no
evidence of severe arthropathy or other focal bone abnormality. Soft
tissues are unremarkable.
IMPRESSION: Negative.

## 2023-02-03 ENCOUNTER — Other Ambulatory Visit: Payer: Self-pay | Admitting: Cardiovascular Disease

## 2023-03-03 ENCOUNTER — Encounter: Payer: Self-pay | Admitting: Internal Medicine

## 2023-04-03 ENCOUNTER — Encounter: Payer: Self-pay | Admitting: Orthopedic Surgery

## 2023-04-24 ENCOUNTER — Other Ambulatory Visit: Payer: Self-pay | Admitting: Family Medicine

## 2023-04-24 DIAGNOSIS — I2510 Atherosclerotic heart disease of native coronary artery without angina pectoris: Secondary | ICD-10-CM

## 2023-04-24 DIAGNOSIS — E785 Hyperlipidemia, unspecified: Secondary | ICD-10-CM

## 2023-04-24 DIAGNOSIS — I10 Essential (primary) hypertension: Secondary | ICD-10-CM

## 2023-05-06 ENCOUNTER — Other Ambulatory Visit: Payer: Self-pay | Admitting: Family Medicine

## 2023-05-06 DIAGNOSIS — N529 Male erectile dysfunction, unspecified: Secondary | ICD-10-CM

## 2023-06-02 ENCOUNTER — Encounter: Payer: Self-pay | Admitting: Family Medicine

## 2023-06-04 ENCOUNTER — Ambulatory Visit: Payer: Medicare Other | Admitting: Family Medicine

## 2023-06-04 ENCOUNTER — Encounter: Payer: Self-pay | Admitting: Family Medicine

## 2023-06-04 VITALS — BP 124/80 | HR 54 | Ht 75.0 in | Wt 253.0 lb

## 2023-06-04 DIAGNOSIS — E785 Hyperlipidemia, unspecified: Secondary | ICD-10-CM | POA: Diagnosis not present

## 2023-06-04 DIAGNOSIS — M4800 Spinal stenosis, site unspecified: Secondary | ICD-10-CM | POA: Diagnosis not present

## 2023-06-04 DIAGNOSIS — N529 Male erectile dysfunction, unspecified: Secondary | ICD-10-CM

## 2023-06-04 DIAGNOSIS — E669 Obesity, unspecified: Secondary | ICD-10-CM | POA: Diagnosis not present

## 2023-06-04 DIAGNOSIS — I2510 Atherosclerotic heart disease of native coronary artery without angina pectoris: Secondary | ICD-10-CM

## 2023-06-04 DIAGNOSIS — I10 Essential (primary) hypertension: Secondary | ICD-10-CM

## 2023-06-04 DIAGNOSIS — N400 Enlarged prostate without lower urinary tract symptoms: Secondary | ICD-10-CM

## 2023-06-04 DIAGNOSIS — Z85820 Personal history of malignant melanoma of skin: Secondary | ICD-10-CM

## 2023-06-04 DIAGNOSIS — J301 Allergic rhinitis due to pollen: Secondary | ICD-10-CM

## 2023-06-04 LAB — LIPID PANEL

## 2023-06-04 MED ORDER — FINASTERIDE 5 MG PO TABS: 5.0000 mg | ORAL_TABLET | Freq: Every day | ORAL | 3 refills | Status: AC

## 2023-06-04 MED ORDER — RAMIPRIL 2.5 MG PO CAPS
2.5000 mg | ORAL_CAPSULE | Freq: Two times a day (BID) | ORAL | 3 refills | Status: AC
Start: 2023-06-04 — End: ?

## 2023-06-04 MED ORDER — TADALAFIL 20 MG PO TABS: 20.0000 mg | ORAL_TABLET | Freq: Every day | ORAL | 5 refills | Status: DC | PRN

## 2023-06-04 MED ORDER — CLOPIDOGREL BISULFATE 75 MG PO TABS: 75.0000 mg | ORAL_TABLET | Freq: Every day | ORAL | 3 refills | Status: AC

## 2023-06-04 NOTE — Progress Notes (Signed)
 is a 68 y.o. male who presents for annual wellness.He is dealing with allergies today with nasal congestion, sneezing, rhinorrhea.  He does have concerns over Lipitor  causing some muscle aches and pains.  Continues on metoprolol .  He is also using Cialis  fairly regularly.  Also takes Plavix .  He is not having any prostate related symptoms and continues on finasteride .  Also taking Altace .  No chest pain, shortness of breath, PND or DOE cardiology.  He follows up twice per year with dermatology he is enjoying his retirement.  Life is stable.  Immunizations and Health Maintenance Immunization History  Administered Date(s) Administered   Fluad Quad(high Dose 65+) 10/27/2019   Influenza Inj Mdck Quad Pf 11/14/2016   Influenza Inj Mdck Quad With Preservative 11/05/2018   Influenza Split 11/20/2008, 10/25/2012, 11/06/2021   Influenza,inj,Quad PF,6+ Mos 09/26/2013, 09/13/2014, 09/09/2017   Influenza-Unspecified 09/25/2015, 11/14/2016, 11/07/2020   PFIZER(Purple Top)SARS-COV-2 Vaccination 03/10/2019, 04/06/2019   Pneumococcal Conjugate-13 02/16/2019   Pneumococcal Polysaccharide-23 04/27/2020   Tdap 01/06/2005, 04/08/2016   Zoster Recombinant(Shingrix ) 04/08/2016, 07/15/2016   Zoster, Live 09/13/2014   Health Maintenance Due  Topic Date Due   Medicare Annual Wellness (AWV)  05/20/2023     Last colonoscopy: 2017 Dentist:? Ophtho:? Exercise:regular  Other doctors caring for patient include: Anthon Baston  Advanced directives:?    Depression screen:  See questionnaire below.     05/20/2022    8:21 AM 05/03/2021    2:03 PM 04/27/2020   10:55 AM 02/16/2019    8:59 AM 04/08/2016    1:38 PM  Depression screen PHQ 2/9  Decreased Interest 0 0 0 0 0  Down, Depressed, Hopeless 0 0 0 0 0  PHQ - 2 Score 0 0 0 0 0  Altered sleeping  0     Tired, decreased energy  0     Change in appetite  0     Feeling bad or failure about yourself   0     Trouble concentrating  0     Moving slowly or  fidgety/restless  0     Suicidal thoughts  0     PHQ-9 Score  0     Difficult doing work/chores  Not difficult at all       Fall Risk Screen: see questionnaire below.    05/20/2022    8:21 AM 05/03/2021    2:02 PM 05/01/2021    3:41 PM 04/27/2020   10:55 AM 02/16/2019    8:58 AM  Fall Risk   Falls in the past year? 0 0 0 0 0  Number falls in past yr: 0 0 0 0   Injury with Fall? 0 0 0 0   Risk for fall due to : No Fall Risks Medication side effect  No Fall Risks   Follow up Falls evaluation completed Falls evaluation completed;Education provided;Falls prevention discussed  Falls evaluation completed     ADL screen:  See questionnaire below Functional Status Survey:     Review of Systems Constitutional: -, -unexpected weight change, -anorexia, -fatigue Allergy: -sneezing, -itching, -congestion Dermatology: denies changing moles, rash, lumps ENT: -runny nose, -ear pain, -sore throat,  Cardiology:  -chest pain, -palpitations, -orthopnea, Respiratory: -cough, -shortness of breath, -dyspnea on exertion, -wheezing,  Gastroenterology: -abdominal pain, -nausea, -vomiting, -diarrhea, -constipation, -dysphagia Hematology: -bleeding or bruising problems Musculoskeletal: -arthralgias, -myalgias, -joint swelling, -back pain, - Ophthalmology: -vision changes,  Urology: -dysuria, -difficulty urinating,  -urinary frequency, -urgency, incontinence Neurology: -, -numbness, , -memory loss, -falls, -dizziness  PHYSICAL EXAM:   General Appearance: Alert, cooperative, no distress, appears stated age Head: Normocephalic, without obvious abnormality, atraumatic Eyes: PERRL, conjunctiva/corneas clear, EOM's intact,  Ears: Normal TM's and external ear canals Nose: Nares normal, mucosa normal, no drainage or sinus tenderness Throat: Lips, mucosa, and tongue normal; teeth and gums normal Neck: Supple, no lymphadenopathy;  thyroid :  no enlargement/tenderness/nodules; no carotid bruit or  JVD Lungs: Clear to auscultation bilaterally without wheezes, rales or ronchi; respirations unlabored Heart: Regular rate and rhythm, S1 and S2 normal, no murmur, rubor gallop Abdomen: Soft, non-tender, nondistended, normoactive bowel sounds,  no masses, no hepatosplenomegaly Skin:  Skin color, texture, turgor normal, no rashes or lesions Lymph nodes: Cervical, supraclavicular, and axillary nodes normal Neurologic:  CNII-XII intact, normal strength, sensation and gait; reflexes 2+ and symmetric throughout Psych: Normal mood, affect, hygiene and grooming.  ASSESSMENT/PLAN: ASHD (arteriosclerotic heart disease) - Plan: clopidogrel  (PLAVIX ) 75 MG tablet, ramipril  (ALTACE ) 2.5 MG capsule  Spinal stenosis, unspecified spinal region  Mild obesity - Plan: CBC with Differential/Platelet, Comprehensive metabolic panel with GFR  Hyperlipidemia with target LDL less than 70 - Plan: Lipid panel  History of melanoma  Essential hypertension - Plan: ramipril  (ALTACE ) 2.5 MG capsule  Benign prostatic hyperplasia without lower urinary tract symptoms - Plan: finasteride  (PROSCAR ) 5 MG tablet  Seasonal allergic rhinitis due to pollen Recommend using Rhinocort for his allergy symptoms as well as Rhinocort.  Discussed the use of Cialis  to be used more on an as-needed basis.  At this point he apparently takes it every Wednesday.  Discussed the statin drug and he will probably stop taking it for a week to see if it makes a difference with his aches and pains and if there is any question call me we will discuss this further.    Immunization recommendations discussed.  Including getting RSV.  Colonoscopy recommendations reviewed   Medicare Attestation I have personally reviewed: The patient's medical and social history Their use of alcohol, tobacco or illicit drugs Their current medications and supplements The patient's functional ability including ADLs,fall risks, home safety risks, cognitive, and  hearing and visual impairment Diet and physical activities Evidence for depression or mood disorders  The patient's weight, height, and BMI have been recorded in the chart.  I have made referrals, counseling, and provided education to the patient based on review of the above and I have provided the patient with a written personalized care plan for preventive services.     Ron Cobbs, MD   06/04/2023

## 2023-06-05 ENCOUNTER — Ambulatory Visit: Payer: Self-pay | Admitting: Family Medicine

## 2023-06-05 ENCOUNTER — Other Ambulatory Visit: Payer: Self-pay | Admitting: Family Medicine

## 2023-06-05 DIAGNOSIS — N529 Male erectile dysfunction, unspecified: Secondary | ICD-10-CM

## 2023-06-05 LAB — CBC WITH DIFFERENTIAL/PLATELET
Basophils Absolute: 0.1 10*3/uL (ref 0.0–0.2)
Basos: 1 %
EOS (ABSOLUTE): 0.3 10*3/uL (ref 0.0–0.4)
Eos: 5 %
Hematocrit: 47.4 % (ref 37.5–51.0)
Hemoglobin: 16.1 g/dL (ref 13.0–17.7)
Immature Grans (Abs): 0 10*3/uL (ref 0.0–0.1)
Immature Granulocytes: 0 %
Lymphocytes Absolute: 1 10*3/uL (ref 0.7–3.1)
Lymphs: 15 %
MCH: 32.6 pg (ref 26.6–33.0)
MCHC: 34 g/dL (ref 31.5–35.7)
MCV: 96 fL (ref 79–97)
Monocytes Absolute: 0.7 10*3/uL (ref 0.1–0.9)
Monocytes: 11 %
Neutrophils Absolute: 4.4 10*3/uL (ref 1.4–7.0)
Neutrophils: 68 %
Platelets: 155 10*3/uL (ref 150–450)
RBC: 4.94 x10E6/uL (ref 4.14–5.80)
RDW: 13.7 % (ref 11.6–15.4)
WBC: 6.5 10*3/uL (ref 3.4–10.8)

## 2023-06-05 LAB — COMPREHENSIVE METABOLIC PANEL WITH GFR
ALT: 33 IU/L (ref 0–44)
AST: 26 IU/L (ref 0–40)
Albumin: 4.4 g/dL (ref 3.9–4.9)
Alkaline Phosphatase: 90 IU/L (ref 44–121)
BUN/Creatinine Ratio: 12 (ref 10–24)
BUN: 14 mg/dL (ref 8–27)
Bilirubin Total: 0.7 mg/dL (ref 0.0–1.2)
CO2: 20 mmol/L (ref 20–29)
Calcium: 9.5 mg/dL (ref 8.6–10.2)
Chloride: 104 mmol/L (ref 96–106)
Creatinine, Ser: 1.14 mg/dL (ref 0.76–1.27)
Globulin, Total: 2.2 g/dL (ref 1.5–4.5)
Glucose: 99 mg/dL (ref 70–99)
Potassium: 4.5 mmol/L (ref 3.5–5.2)
Sodium: 140 mmol/L (ref 134–144)
Total Protein: 6.6 g/dL (ref 6.0–8.5)
eGFR: 70 mL/min/{1.73_m2} (ref >59)

## 2023-06-05 LAB — LIPID PANEL
Cholesterol, Total: 128 mg/dL (ref 100–199)
HDL: 50 mg/dL (ref >39)
LDL CALC COMMENT:: 2.6 ratio (ref 0.0–5.0)
LDL Chol Calc (NIH): 59 mg/dL (ref 0–99)
Triglycerides: 105 mg/dL (ref 0–149)
VLDL Cholesterol Cal: 19 mg/dL (ref 5–40)

## 2023-09-21 ENCOUNTER — Other Ambulatory Visit: Payer: Self-pay | Admitting: Family Medicine

## 2023-09-21 DIAGNOSIS — N529 Male erectile dysfunction, unspecified: Secondary | ICD-10-CM

## 2023-09-24 ENCOUNTER — Other Ambulatory Visit: Payer: Self-pay | Admitting: Family Medicine

## 2023-09-24 DIAGNOSIS — N529 Male erectile dysfunction, unspecified: Secondary | ICD-10-CM

## 2023-11-09 ENCOUNTER — Encounter: Payer: Self-pay | Admitting: Radiology

## 2023-11-21 ENCOUNTER — Encounter: Payer: Self-pay | Admitting: Family Medicine

## 2023-11-23 ENCOUNTER — Telehealth: Payer: Self-pay

## 2023-11-23 NOTE — Telephone Encounter (Signed)
 I called [facility], He wants to talk with Dr. Joyce about Tzhncb. I let him know that he was out of the office today, and he would answer him asap.   Copied from CRM [id number]. Topic: General - Other >> Nov 23, 2023  4:18 PM Brandon Frederick wrote: Reason for CRM: patient is calling for someone to follow up with him in regards to his my chart messages about [facility]. PH: [phone]

## 2024-01-13 ENCOUNTER — Other Ambulatory Visit (INDEPENDENT_AMBULATORY_CARE_PROVIDER_SITE_OTHER): Payer: Self-pay

## 2024-01-13 ENCOUNTER — Ambulatory Visit: Admitting: Orthopaedic Surgery

## 2024-01-13 DIAGNOSIS — M25562 Pain in left knee: Secondary | ICD-10-CM | POA: Diagnosis not present

## 2024-01-13 MED ORDER — METHYLPREDNISOLONE ACETATE 40 MG/ML IJ SUSP
40.0000 mg | INTRAMUSCULAR | Status: AC | PRN
  Administered 2024-01-13: 40 mg via INTRA_ARTICULAR

## 2024-01-13 MED ORDER — LIDOCAINE HCL 1 % IJ SOLN
3.0000 mL | INTRAMUSCULAR | Status: AC | PRN
  Administered 2024-01-13: 3 mL

## 2024-01-13 NOTE — Progress Notes (Signed)
 Brandon Frederick is a patient of the office here but I have not seen him before.  He has seen Dr. Georgina and Dr. Erwin.  He comes in with about 2 months history of left knee pain with locking catching especially going up and down stairs.  He is an active and young appearing 69 year old gentleman.  He said about 20 years ago he did have arthroscopic surgery on that left knee for meniscal tear and they did clean things up and he has never had any issues since then.  He points to the medial aspect of his knee and he says he gets a sharp catching type of pain that does go away.  However he does feel like it is getting worse going up and down stairs and there is times where his knee does not feel comfortable and it feels like it is going to give way on him.  Examination of his left knee today shows no effusion with good range of motion but he does have medial joint line tenderness and when I rotate the tibia on the femur to the medial side he does have a catching sensation and definite sharp pain.  X-rays of his left knee today show well-maintained medial lateral compartments and neutral alignment.  There is certainly a chance that he may have a medial meniscal tear.  I did recommend a steroid injection in his left knee today which he agreed to and tolerated well.  He will work on dance movement psychotherapist exercises.  If he continues to have mechanical symptoms he will give us  a call because the next step would be ordering an MRI of his left knee to rule out a meniscal tear.  He agrees with this treatment plan.    Procedure Note  Patient: Brandon Frederick             Date of Birth: [birth year]           MRN: [id number]             Visit Date: 01/13/2024  Procedures: Visit Diagnoses:  1. Acute pain of left knee     Large Joint Inj: L knee on 01/13/2024 3:50 PM Indications: diagnostic evaluation and pain Details: 22 G 1.5 in needle, superolateral approach  Arthrogram: No  Medications: 3 mL lidocaine  1 %; 40 mg  methylPREDNISolone  acetate 40 MG/ML Outcome: tolerated well, no immediate complications Procedure, treatment alternatives, risks and benefits explained, specific risks discussed. Consent was given by the patient. Immediately prior to procedure a time out was called to verify the correct patient, procedure, equipment, support staff and site/side marked as required. Patient was prepped and draped in the usual sterile fashion.

## 2024-06-08 ENCOUNTER — Ambulatory Visit: Payer: Self-pay | Admitting: Family Medicine
